# Patient Record
Sex: Male | Born: 1950
Health system: Southern US, Community
[De-identification: ages and names within clinical notes are randomized; demographics above are authoritative.]

## PROBLEM LIST (undated history)

## (undated) DIAGNOSIS — F32A Depression, unspecified: Secondary | ICD-10-CM

## (undated) DIAGNOSIS — J449 Chronic obstructive pulmonary disease, unspecified: Secondary | ICD-10-CM

## (undated) DIAGNOSIS — I10 Essential (primary) hypertension: Secondary | ICD-10-CM

## (undated) DIAGNOSIS — D126 Benign neoplasm of colon, unspecified: Secondary | ICD-10-CM

## (undated) DIAGNOSIS — F419 Anxiety disorder, unspecified: Secondary | ICD-10-CM

## (undated) DIAGNOSIS — M1990 Unspecified osteoarthritis, unspecified site: Secondary | ICD-10-CM

## (undated) DIAGNOSIS — F329 Major depressive disorder, single episode, unspecified: Secondary | ICD-10-CM

## (undated) DIAGNOSIS — E119 Type 2 diabetes mellitus without complications: Secondary | ICD-10-CM

## (undated) HISTORY — DX: Anxiety disorder, unspecified: F41.9

## (undated) HISTORY — DX: Benign neoplasm of colon, unspecified: D12.6

## (undated) HISTORY — PX: CERVICAL DISC SURGERY: SHX588

## (undated) HISTORY — PX: JOINT REPLACEMENT: SHX530

## (undated) HISTORY — PX: KNEE ARTHROSCOPY: SUR90

---

## 1999-07-16 ENCOUNTER — Emergency Department (HOSPITAL_COMMUNITY): Admission: EM | Admit: 1999-07-16 | Discharge: 1999-07-16 | Payer: Self-pay | Admitting: Emergency Medicine

## 1999-07-25 ENCOUNTER — Ambulatory Visit (HOSPITAL_COMMUNITY): Admission: RE | Admit: 1999-07-25 | Discharge: 1999-07-25 | Payer: Self-pay | Admitting: General Surgery

## 1999-07-25 ENCOUNTER — Encounter: Payer: Self-pay | Admitting: General Surgery

## 1999-07-30 ENCOUNTER — Encounter: Payer: Self-pay | Admitting: General Surgery

## 1999-07-30 ENCOUNTER — Ambulatory Visit (HOSPITAL_COMMUNITY): Admission: RE | Admit: 1999-07-30 | Discharge: 1999-07-30 | Payer: Self-pay | Admitting: General Surgery

## 1999-08-01 ENCOUNTER — Encounter: Payer: Self-pay | Admitting: General Surgery

## 1999-08-01 ENCOUNTER — Ambulatory Visit (HOSPITAL_COMMUNITY): Admission: RE | Admit: 1999-08-01 | Discharge: 1999-08-01 | Payer: Self-pay | Admitting: General Surgery

## 1999-12-12 DIAGNOSIS — D126 Benign neoplasm of colon, unspecified: Secondary | ICD-10-CM

## 1999-12-12 HISTORY — DX: Benign neoplasm of colon, unspecified: D12.6

## 1999-12-17 ENCOUNTER — Other Ambulatory Visit: Admission: RE | Admit: 1999-12-17 | Discharge: 1999-12-17 | Payer: Self-pay | Admitting: Gastroenterology

## 1999-12-17 ENCOUNTER — Encounter (INDEPENDENT_AMBULATORY_CARE_PROVIDER_SITE_OTHER): Payer: Self-pay

## 2001-02-04 ENCOUNTER — Emergency Department (HOSPITAL_COMMUNITY): Admission: EM | Admit: 2001-02-04 | Discharge: 2001-02-04 | Payer: Self-pay | Admitting: Emergency Medicine

## 2001-02-04 ENCOUNTER — Encounter: Payer: Self-pay | Admitting: Emergency Medicine

## 2007-03-20 ENCOUNTER — Emergency Department (HOSPITAL_COMMUNITY): Admission: EM | Admit: 2007-03-20 | Discharge: 2007-03-21 | Payer: Self-pay | Admitting: Emergency Medicine

## 2011-02-04 ENCOUNTER — Encounter: Payer: Medicaid Other | Attending: Physical Medicine & Rehabilitation

## 2011-02-04 ENCOUNTER — Ambulatory Visit (HOSPITAL_BASED_OUTPATIENT_CLINIC_OR_DEPARTMENT_OTHER): Payer: Medicaid Other | Admitting: Physical Medicine & Rehabilitation

## 2011-02-04 DIAGNOSIS — M545 Low back pain, unspecified: Secondary | ICD-10-CM | POA: Insufficient documentation

## 2011-02-04 DIAGNOSIS — M25559 Pain in unspecified hip: Secondary | ICD-10-CM

## 2011-02-04 DIAGNOSIS — M25519 Pain in unspecified shoulder: Secondary | ICD-10-CM | POA: Insufficient documentation

## 2011-02-04 DIAGNOSIS — M25529 Pain in unspecified elbow: Secondary | ICD-10-CM

## 2011-02-04 DIAGNOSIS — E119 Type 2 diabetes mellitus without complications: Secondary | ICD-10-CM | POA: Insufficient documentation

## 2011-02-04 DIAGNOSIS — E669 Obesity, unspecified: Secondary | ICD-10-CM | POA: Insufficient documentation

## 2011-02-04 DIAGNOSIS — F17200 Nicotine dependence, unspecified, uncomplicated: Secondary | ICD-10-CM | POA: Insufficient documentation

## 2011-02-04 DIAGNOSIS — M5430 Sciatica, unspecified side: Secondary | ICD-10-CM

## 2011-02-04 DIAGNOSIS — I10 Essential (primary) hypertension: Secondary | ICD-10-CM | POA: Insufficient documentation

## 2011-02-04 DIAGNOSIS — G894 Chronic pain syndrome: Secondary | ICD-10-CM | POA: Insufficient documentation

## 2012-11-19 ENCOUNTER — Other Ambulatory Visit: Payer: Self-pay | Admitting: Family Medicine

## 2012-11-19 DIAGNOSIS — R1011 Right upper quadrant pain: Secondary | ICD-10-CM

## 2012-11-22 ENCOUNTER — Other Ambulatory Visit: Payer: Medicaid Other

## 2012-11-24 ENCOUNTER — Ambulatory Visit
Admission: RE | Admit: 2012-11-24 | Discharge: 2012-11-24 | Disposition: A | Discharge status: Home / Self Care | Payer: Medicaid Other | Source: Ambulatory Visit | Attending: Family Medicine | Admitting: Family Medicine

## 2012-11-24 DIAGNOSIS — R1011 Right upper quadrant pain: Secondary | ICD-10-CM

## 2012-12-25 ENCOUNTER — Emergency Department (HOSPITAL_COMMUNITY): Payer: Medicaid Other

## 2012-12-25 ENCOUNTER — Encounter (HOSPITAL_COMMUNITY): Payer: Self-pay | Admitting: Emergency Medicine

## 2012-12-25 ENCOUNTER — Inpatient Hospital Stay (HOSPITAL_COMMUNITY)
Admission: EM | Admit: 2012-12-25 | Discharge: 2012-12-30 | DRG: 392 | Disposition: A | Discharge status: Home / Self Care | Payer: Medicaid Other | Attending: Internal Medicine | Admitting: Internal Medicine

## 2012-12-25 DIAGNOSIS — R933 Abnormal findings on diagnostic imaging of other parts of digestive tract: Secondary | ICD-10-CM

## 2012-12-25 DIAGNOSIS — Z9660 Presence of unspecified orthopedic joint implant: Secondary | ICD-10-CM

## 2012-12-25 DIAGNOSIS — Z794 Long term (current) use of insulin: Secondary | ICD-10-CM

## 2012-12-25 DIAGNOSIS — R1013 Epigastric pain: Secondary | ICD-10-CM

## 2012-12-25 DIAGNOSIS — Z79899 Other long term (current) drug therapy: Secondary | ICD-10-CM

## 2012-12-25 DIAGNOSIS — K2980 Duodenitis without bleeding: Principal | ICD-10-CM | POA: Diagnosis present

## 2012-12-25 DIAGNOSIS — D72829 Elevated white blood cell count, unspecified: Secondary | ICD-10-CM | POA: Diagnosis present

## 2012-12-25 DIAGNOSIS — R0902 Hypoxemia: Secondary | ICD-10-CM | POA: Diagnosis present

## 2012-12-25 DIAGNOSIS — I10 Essential (primary) hypertension: Secondary | ICD-10-CM | POA: Diagnosis present

## 2012-12-25 DIAGNOSIS — R05 Cough: Secondary | ICD-10-CM

## 2012-12-25 DIAGNOSIS — F411 Generalized anxiety disorder: Secondary | ICD-10-CM | POA: Diagnosis present

## 2012-12-25 DIAGNOSIS — M545 Low back pain, unspecified: Secondary | ICD-10-CM | POA: Diagnosis present

## 2012-12-25 DIAGNOSIS — K2970 Gastritis, unspecified, without bleeding: Secondary | ICD-10-CM | POA: Diagnosis present

## 2012-12-25 DIAGNOSIS — E669 Obesity, unspecified: Secondary | ICD-10-CM | POA: Diagnosis present

## 2012-12-25 DIAGNOSIS — K7689 Other specified diseases of liver: Secondary | ICD-10-CM | POA: Diagnosis present

## 2012-12-25 DIAGNOSIS — G8929 Other chronic pain: Secondary | ICD-10-CM | POA: Diagnosis present

## 2012-12-25 DIAGNOSIS — F419 Anxiety disorder, unspecified: Secondary | ICD-10-CM

## 2012-12-25 DIAGNOSIS — F17200 Nicotine dependence, unspecified, uncomplicated: Secondary | ICD-10-CM | POA: Diagnosis present

## 2012-12-25 DIAGNOSIS — H9190 Unspecified hearing loss, unspecified ear: Secondary | ICD-10-CM | POA: Diagnosis present

## 2012-12-25 DIAGNOSIS — K315 Obstruction of duodenum: Secondary | ICD-10-CM | POA: Diagnosis present

## 2012-12-25 DIAGNOSIS — E119 Type 2 diabetes mellitus without complications: Secondary | ICD-10-CM | POA: Diagnosis present

## 2012-12-25 DIAGNOSIS — Z6837 Body mass index (BMI) 37.0-37.9, adult: Secondary | ICD-10-CM

## 2012-12-25 HISTORY — DX: Depression, unspecified: F32.A

## 2012-12-25 HISTORY — DX: Chronic obstructive pulmonary disease, unspecified: J44.9

## 2012-12-25 HISTORY — DX: Essential (primary) hypertension: I10

## 2012-12-25 HISTORY — DX: Type 2 diabetes mellitus without complications: E11.9

## 2012-12-25 HISTORY — DX: Major depressive disorder, single episode, unspecified: F32.9

## 2012-12-25 LAB — GLUCOSE, CAPILLARY
Glucose-Capillary: 198 mg/dL — ABNORMAL HIGH (ref 70–99)
Glucose-Capillary: 196 mg/dL — ABNORMAL HIGH (ref 70–99)
Glucose-Capillary: 167 mg/dL — ABNORMAL HIGH (ref 70–99)

## 2012-12-25 LAB — COMPREHENSIVE METABOLIC PANEL
ALT: 15 U/L (ref 0–53)
AST: 11 U/L (ref 0–37)
Alkaline Phosphatase: 65 U/L (ref 39–117)
CO2: 24 mEq/L (ref 19–32)
Chloride: 102 mEq/L (ref 96–112)
GFR calc non Af Amer: 69 mL/min — ABNORMAL LOW (ref >90)
Glucose, Bld: 114 mg/dL — ABNORMAL HIGH (ref 70–99)
Potassium: 4.5 mEq/L (ref 3.5–5.1)
Sodium: 138 mEq/L (ref 135–145)
Total Bilirubin: 0.1 mg/dL — ABNORMAL LOW (ref 0.3–1.2)

## 2012-12-25 LAB — CBC WITH DIFFERENTIAL/PLATELET
Basophils Absolute: 0.1 10*3/uL (ref 0.0–0.1)
Lymphocytes Relative: 21 % (ref 12–46)
Lymphs Abs: 2.8 10*3/uL (ref 0.7–4.0)
Neutro Abs: 9.5 10*3/uL — ABNORMAL HIGH (ref 1.7–7.7)
Platelets: 424 10*3/uL — ABNORMAL HIGH (ref 150–400)
RBC: 4.31 MIL/uL (ref 4.22–5.81)
RDW: 13.4 % (ref 11.5–15.5)
WBC: 13.5 10*3/uL — ABNORMAL HIGH (ref 4.0–10.5)

## 2012-12-25 MED ORDER — FENTANYL CITRATE 0.05 MG/ML IJ SOLN
50.0000 ug | Freq: Once | INTRAMUSCULAR | Status: AC
  Administered 2012-12-25: 50 ug via INTRAVENOUS
  Filled 2012-12-25: qty 2

## 2012-12-25 MED ORDER — PANTOPRAZOLE SODIUM 40 MG IV SOLR
40.0000 mg | Freq: Once | INTRAVENOUS | Status: AC
  Administered 2012-12-25: 40 mg via INTRAVENOUS
  Filled 2012-12-25: qty 40

## 2012-12-25 MED ORDER — INSULIN ASPART 100 UNIT/ML ~~LOC~~ SOLN
0.0000 [IU] | Freq: Three times a day (TID) | SUBCUTANEOUS | Status: DC
  Administered 2012-12-25 (×2): 3 [IU] via SUBCUTANEOUS
  Administered 2012-12-26: 2 [IU] via SUBCUTANEOUS
  Administered 2012-12-26 – 2012-12-27 (×2): 3 [IU] via SUBCUTANEOUS
  Administered 2012-12-27: 5 [IU] via SUBCUTANEOUS
  Administered 2012-12-27 – 2012-12-28 (×3): 3 [IU] via SUBCUTANEOUS
  Administered 2012-12-29: 5 [IU] via SUBCUTANEOUS
  Administered 2012-12-29 – 2012-12-30 (×3): 3 [IU] via SUBCUTANEOUS

## 2012-12-25 MED ORDER — ACETAMINOPHEN 650 MG RE SUPP: 650.0000 mg | Freq: Four times a day (QID) | RECTAL | Status: DC | PRN

## 2012-12-25 MED ORDER — PANTOPRAZOLE SODIUM 40 MG IV SOLR
40.0000 mg | Freq: Two times a day (BID) | INTRAVENOUS | Status: DC
  Filled 2012-12-25: qty 40

## 2012-12-25 MED ORDER — ALBUTEROL SULFATE (5 MG/ML) 0.5% IN NEBU
2.5000 mg | INHALATION_SOLUTION | RESPIRATORY_TRACT | Status: DC
  Administered 2012-12-25: 2.5 mg via RESPIRATORY_TRACT
  Filled 2012-12-25: qty 0.5

## 2012-12-25 MED ORDER — MORPHINE SULFATE 2 MG/ML IJ SOLN
2.0000 mg | INTRAMUSCULAR | Status: DC | PRN
  Administered 2012-12-25 – 2012-12-27 (×15): 4 mg via INTRAVENOUS
  Administered 2012-12-27: 2 mg via INTRAVENOUS
  Administered 2012-12-27 (×2): 4 mg via INTRAVENOUS
  Administered 2012-12-28 – 2012-12-29 (×11): 2 mg via INTRAVENOUS
  Administered 2012-12-29: 4 mg via INTRAVENOUS
  Administered 2012-12-29 (×2): 2 mg via INTRAVENOUS
  Administered 2012-12-30 (×3): 4 mg via INTRAVENOUS
  Filled 2012-12-25: qty 2
  Filled 2012-12-25: qty 1
  Filled 2012-12-25 (×2): qty 2
  Filled 2012-12-25: qty 1
  Filled 2012-12-25: qty 2
  Filled 2012-12-25: qty 1
  Filled 2012-12-25 (×4): qty 2
  Filled 2012-12-25 (×3): qty 1
  Filled 2012-12-25 (×7): qty 2
  Filled 2012-12-25: qty 1
  Filled 2012-12-25: qty 2
  Filled 2012-12-25 (×2): qty 1
  Filled 2012-12-25: qty 2
  Filled 2012-12-25: qty 1
  Filled 2012-12-25: qty 2
  Filled 2012-12-25: qty 1
  Filled 2012-12-25: qty 2
  Filled 2012-12-25 (×3): qty 1
  Filled 2012-12-25 (×2): qty 2

## 2012-12-25 MED ORDER — IOHEXOL 300 MG/ML  SOLN
100.0000 mL | Freq: Once | INTRAMUSCULAR | Status: AC | PRN
  Administered 2012-12-25: 100 mL via INTRAVENOUS

## 2012-12-25 MED ORDER — SODIUM CHLORIDE 0.9 % IV SOLN
INTRAVENOUS | Status: DC
  Administered 2012-12-25 – 2012-12-29 (×5): via INTRAVENOUS

## 2012-12-25 MED ORDER — HYDROMORPHONE HCL PF 1 MG/ML IJ SOLN
1.0000 mg | Freq: Once | INTRAMUSCULAR | Status: AC
  Administered 2012-12-25: 1 mg via INTRAVENOUS
  Filled 2012-12-25: qty 1

## 2012-12-25 MED ORDER — PIPERACILLIN-TAZOBACTAM 3.375 G IVPB
3.3750 g | Freq: Three times a day (TID) | INTRAVENOUS | Status: DC
  Administered 2012-12-25 – 2012-12-29 (×14): 3.375 g via INTRAVENOUS
  Filled 2012-12-25 (×16): qty 50

## 2012-12-25 MED ORDER — ACETAMINOPHEN 325 MG PO TABS: 650.0000 mg | ORAL_TABLET | Freq: Four times a day (QID) | ORAL | Status: DC | PRN

## 2012-12-25 MED ORDER — HYDROMORPHONE HCL PF 1 MG/ML IJ SOLN: 1.0000 mg | INTRAMUSCULAR | Status: DC | PRN

## 2012-12-25 MED ORDER — DIAZEPAM 5 MG/ML IJ SOLN
2.5000 mg | Freq: Three times a day (TID) | INTRAMUSCULAR | Status: DC | PRN
  Administered 2012-12-26 – 2012-12-29 (×5): 2.5 mg via INTRAVENOUS
  Filled 2012-12-25 (×6): qty 2

## 2012-12-25 MED ORDER — ONDANSETRON HCL 4 MG/2ML IJ SOLN
4.0000 mg | Freq: Four times a day (QID) | INTRAMUSCULAR | Status: DC | PRN
  Administered 2012-12-26: 4 mg via INTRAVENOUS
  Filled 2012-12-25: qty 2

## 2012-12-25 MED ORDER — IOHEXOL 300 MG/ML  SOLN
50.0000 mL | Freq: Once | INTRAMUSCULAR | Status: AC | PRN
  Administered 2012-12-25: 50 mL via ORAL

## 2012-12-25 MED ORDER — ALBUTEROL SULFATE (5 MG/ML) 0.5% IN NEBU
2.5000 mg | INHALATION_SOLUTION | Freq: Once | RESPIRATORY_TRACT | Status: AC
  Administered 2012-12-25: 2.5 mg via RESPIRATORY_TRACT

## 2012-12-25 MED ORDER — ONDANSETRON HCL 4 MG PO TABS: 4.0000 mg | ORAL_TABLET | Freq: Four times a day (QID) | ORAL | Status: DC | PRN

## 2012-12-25 MED ORDER — IPRATROPIUM BROMIDE 0.02 % IN SOLN
0.5000 mg | RESPIRATORY_TRACT | Status: DC
  Administered 2012-12-25 – 2012-12-26 (×5): 0.5 mg via RESPIRATORY_TRACT
  Filled 2012-12-25 (×6): qty 2.5

## 2012-12-25 MED ORDER — SODIUM CHLORIDE 0.9 % IV SOLN
8.0000 mg/h | INTRAVENOUS | Status: DC
  Administered 2012-12-25 – 2012-12-27 (×2): 8 mg/h via INTRAVENOUS
  Filled 2012-12-25 (×6): qty 80

## 2012-12-25 MED ORDER — LEVOFLOXACIN 500 MG PO TABS
500.0000 mg | ORAL_TABLET | Freq: Once | ORAL | Status: AC
  Administered 2012-12-25: 500 mg via ORAL
  Filled 2012-12-25: qty 1

## 2012-12-25 MED ORDER — MORPHINE SULFATE 2 MG/ML IJ SOLN
2.0000 mg | INTRAMUSCULAR | Status: DC | PRN
  Administered 2012-12-25: 2 mg via INTRAVENOUS
  Filled 2012-12-25: qty 1

## 2012-12-25 MED ORDER — PREDNISONE 20 MG PO TABS
60.0000 mg | ORAL_TABLET | Freq: Once | ORAL | Status: AC
  Administered 2012-12-25: 60 mg via ORAL
  Filled 2012-12-25: qty 3

## 2012-12-25 NOTE — ED Provider Notes (Signed)
History     CSN: [id number]  Arrival date & time 12/25/12  0443   First MD Initiated Contact with Patient 12/25/12 0450      Chief Complaint  Patient presents with  . Abdominal Pain    (Consider location/radiation/quality/duration/timing/severity/associated sxs/prior treatment) HPI Comments: 62 y/o male with a history of diabetes presents to the ED complaining of right upper quadrant abdominal pain x 1 week. He went to his PCP last week who mentioned concern of gall bladder disease to patient. Describes the pain as constant, sharp, worse with deep inspiration rated 10/10. Tried taking his percocet he has for chronic back pain without relief of his abdominal pain. Admits to associated nausea without vomiting. No fever or chills. Denies diarrhea or urinary changes. No change in appetite. Admits to productive cough for the past week with associated shortness of breath. He is an every day smoker. He had an abdominal ultrasound on 1/10 hepatomegaly with diffuse fatty infiltration of the liver, no focal abnormality, no gallstones, and the pancreas is largely obscured by bowel gas.  Patient is a 62 y.o. male presenting with abdominal pain. The history is provided by the patient.  Abdominal Pain Associated symptoms: cough, nausea and shortness of breath   Associated symptoms: no chest pain, no chills, no diarrhea, no fever and no vomiting     Past Medical History  Diagnosis Date  . Diabetes mellitus without complication     Past Surgical History  Procedure Laterality Date  . Joint replacement      No family history on file.  History  Substance Use Topics  . Smoking status: Current Every Day Smoker    Types: Cigarettes  . Smokeless tobacco: Not on file  . Alcohol Use: No      Review of Systems  Constitutional: Negative for fever, chills and appetite change.  Respiratory: Positive for cough and shortness of breath.   Cardiovascular: Negative for chest pain.  Gastrointestinal:  Positive for nausea and abdominal pain. Negative for vomiting and diarrhea.  Musculoskeletal: Negative for back pain.  All other systems reviewed and are negative.    Allergies  Review of patient's allergies indicates no known allergies.  Home Medications   Current Outpatient Rx  Name  Route  Sig  Dispense  Refill  . diazepam (VALIUM) 10 MG tablet   Oral   Take 10 mg by mouth every 6 (six) hours as needed for anxiety.         . insulin NPH-insulin regular (NOVOLIN 70/30) (70-30) 100 UNIT/ML injection   Subcutaneous   Inject into the skin 2 (two) times daily with a meal.         . lisinopril (PRINIVIL,ZESTRIL) 10 MG tablet   Oral   Take 10 mg by mouth daily.         Marland Kitchen oxyCODONE-acetaminophen (PERCOCET/ROXICET) 5-325 MG per tablet   Oral   Take 1 tablet by mouth every 4 (four) hours as needed for pain.           BP 160/92  Pulse 110  Temp(Src) 98 F (36.7 C) (Oral)  Resp 20  Wt 250 lb (113.399 kg)  SpO2 99%  Physical Exam  Nursing note and vitals reviewed. Constitutional: He is oriented to person, place, and time. He appears well-developed. No distress.  Obese  HENT:  Head: Normocephalic and atraumatic.  Mouth/Throat: Oropharynx is clear and moist.  Eyes: Conjunctivae are normal.  Neck: Normal range of motion. Neck supple. No JVD present.  Cardiovascular: Regular  rhythm, normal heart sounds and intact distal pulses.  Tachycardia present.   Pulmonary/Chest: Tachypnea noted. He has wheezes (scattered). He has rhonchi (scattered inspiratory and expiratory).  Abdominal: Soft. Bowel sounds are normal. There is tenderness in the right upper quadrant and epigastric area. There is guarding and positive Murphy's sign. There is no rigidity and no rebound.  No peritoneal signs.  Musculoskeletal: Normal range of motion. He exhibits no edema.  Neurological: He is alert and oriented to person, place, and time.  Skin: Skin is warm and dry.  Psychiatric: He has a normal  mood and affect. His behavior is normal.    ED Course  Procedures (including critical care time)  Labs Reviewed  CBC WITH DIFFERENTIAL  COMPREHENSIVE METABOLIC PANEL  LIPASE, BLOOD   Dg Chest 2 View  12/25/2012  *RADIOLOGY REPORT*  Clinical Data: Anterior right chest and abdominal pain; shortness of breath.  History of smoking and diabetes.  CHEST - 2 VIEW  Comparison: None.  Findings: The lungs are well-aerated.  Minimal right basilar opacity may reflect atelectasis or possibly mild pneumonia.  There is no evidence of pleural effusion or pneumothorax.  The heart is normal in size; the mediastinal contour is within normal limits.  No acute osseous abnormalities are seen.  IMPRESSION: Minimal right basilar opacity may reflect atelectasis or possibly mild pneumonia.   Original Report Authenticated By: Tonia Ghent, M.D.      No diagnosis found.    MDM  RUQ abdominal pain concerning gall bladder disease and productive cough, sob. CXR showing mild pneumonia. He will be started on abx. Awaiting abdominal US. Case discussed with Kirichenko, PA-C who will take over care of patient at shift change.        Trevor Mace, PA-C 12/25/12 [phone]

## 2012-12-25 NOTE — ED Provider Notes (Signed)
Medical screening examination/treatment/procedure(s) were conducted as a shared visit with non-physician practitioner(s) and myself.  I personally evaluated the patient during the encounter  Questionable right lower lobe pneumonia.  Patient started on antibiotics at this time.  He continues to have right-sided abdominal pain.  CT scan pending to further evaluate.  The patient will be monitored closely in the emergency department.  He'll likely require admission as he continues to not look well   Lyanne Co, MD 12/25/12 [phone]

## 2012-12-25 NOTE — ED Notes (Signed)
Pt alert, arrives from home, c/o right upper quad abd pain, onset was last week, seen PCP, presents to ED with cont pain, c/o nausea, denies emesis, resp even unlabored, skin pwd

## 2012-12-25 NOTE — Progress Notes (Signed)
Pt reports that he had a pair of prescription sunglasses that were black that he had with him in the ED. He states that they are not present now in his room. This RN checked this pt's belongings to confirm that they were not in his bag. This RN called the ED department, and spoke with the unit secretary to ask the pt's nurse to check for the sunglasses.

## 2012-12-25 NOTE — H&P (Addendum)
Joseph Manning is an 62 y.o. male.   PCP:   No primary provider on file.   Chief Complaint:  RUQ Pain.  HPI: 69 male who has been having on/off RUQ pains and had a RUQ [place] @ one month ago which showed Nml GB and Fatty liver.  He called me at 4 am with excruciating pain and in tears in the RUQ with Nausea/No Emesis.  Sxs have been getting worse over the last week as well. Pain = Sharp.  Worse with inspriation.  No Fever/Chills.  Has been on Percocet for LBP.  Exam in ED was Sig ab pain and Murphys sign.  ED was anticipating a hot GB and started him on Abx and set him up for CT scan.  Scans and CXR c/w RLL Atx - story not consistent with PNA despite a little cough.  CT showed a significant Duodenitis and I was called for inpatient admit.  I asked ED to consult Dr Anselm Jungling group for help ruling out an Ulcer or Crohn's or other pathology.  We will keep him on IV Abx and start IV PPI. He looks ill and while getting a neb he had a Vagal episode and needed a fluid bolus. He was treated with Fentanyl for pain and is in and out of sleep but still is in pain.      Past Medical History:  Past Medical History  Diagnosis Date  . Diabetes mellitus without complication     Past Surgical History  Procedure Laterality Date  . Joint replacement        Allergies:  No Known Allergies   Medications: Prior to Admission medications   Medication Sig Start Date End Date Taking? Authorizing Provider  diazepam (VALIUM) 10 MG tablet Take 10 mg by mouth every 6 (six) hours as needed for anxiety.   Yes Historical Provider, MD  insulin NPH-insulin regular (NOVOLIN 70/30) (70-30) 100 UNIT/ML injection Inject 24-34 Units into the skin 2 (two) times daily with a meal.    Yes Historical Provider, MD  lisinopril (PRINIVIL,ZESTRIL) 10 MG tablet Take 10 mg by mouth daily.   Yes Historical Provider, MD  oxyCODONE-acetaminophen (PERCOCET/ROXICET) 5-325 MG per tablet Take 1 tablet by mouth every 4 (four) hours as needed  for pain.   Yes Historical Provider, MD      (Not in a hospital admission)   Social History:  reports that he has been smoking Cigarettes.  He has been smoking about 0.00 packs per day. He does not have any smokeless tobacco history on file. He reports that he does not drink alcohol. His drug history is not on file.  Family History: No family history on file.  Review of Systems:  Review of Systems - -No fever, chills and appetite change.  Respiratory: + cough/SOB and Pleurisy Cardiovascular: Negative for chest pain.  Gastrointestinal: + Nausea without vomiting.  + Ab Pains. No Diarrhea. Chronic back pain.  All other systems reviewed and are negative.    Physical Exam:  Blood pressure 148/70, pulse 86, temperature 97.9 F (36.6 C), temperature source Oral, resp. rate 20, weight 113.399 kg (250 lb), SpO2 98.00%. Filed Vitals:   12/25/12 0606 12/25/12 0615 12/25/12 0630 12/25/12 0846  BP: 113/74  120/62 148/70  Pulse: 105  81 86  Temp:    97.9 F (36.6 C)  TempSrc:    Oral  Resp:   22 20  Weight:      SpO2: 94% 88% 93% 98%   General appearance: Uncomfortable  in bed but in out of sleep from the meds. Head: Normocephalic, without obvious abnormality, atraumatic Eyes: conjunctivae/corneas clear. PERRL, EOM's intact.  Nose: Nares normal. Septum midline. Mucosa normal. No drainage or sinus tenderness. Throat: lips, mucosa, and tongue normal; teeth and gums normal Neck: no adenopathy, no carotid bruit, no JVD and thyroid not enlarged, symmetric, no tenderness/mass/nodules Resp: A little retracted but clear. Cardio: regular GI: + tender, + Murphy's, + Rebound, + Guarding Extremities: extremities normal, atraumatic, no cyanosis or edema Pulses: 2+ and symmetric Lymph nodes: no cervical lymphadenopathy Neurologic: Alert and oriented X 3, normal strength and tone. Normal symmetric reflexes.     Labs on Admission:   Recent Labs  12/25/12 0544  NA 138  K 4.5  CL 102  CO2  24  GLUCOSE 114*  BUN 23  CREATININE 1.12  CALCIUM 9.4    Recent Labs  12/25/12 0544  AST 11  ALT 15  ALKPHOS 65  BILITOT 0.1*  PROT 7.5  ALBUMIN 3.4*    Recent Labs  12/25/12 0544  LIPASE 41    Recent Labs  12/25/12 0544  WBC 13.5*  NEUTROABS 9.5*  HGB 13.5  HCT 40.7  MCV 94.4  PLT 424*   No results found for this basename: CKTOTAL, CKMB, CKMBINDEX, TROPONINI,  in the last 72 hours No results found for this basename: INR,  PROTIME     LAB RESULT POCT:  Results for orders placed during the hospital encounter of 12/25/12  CBC WITH DIFFERENTIAL      Result Value Range   WBC 13.5 (*) 4.0 - 10.5 K/uL   RBC 4.31  4.22 - 5.81 MIL/uL   Hemoglobin 13.5  13.0 - 17.0 g/dL   HCT 40.9  81.1 - 91.4 %   MCV 94.4  78.0 - 100.0 fL   MCH 31.3  26.0 - 34.0 pg   MCHC 33.2  30.0 - 36.0 g/dL   RDW 78.2  95.6 - 21.3 %   Platelets 424 (*) 150 - 400 K/uL   Neutrophils Relative 70  43 - 77 %   Neutro Abs 9.5 (*) 1.7 - 7.7 K/uL   Lymphocytes Relative 21  12 - 46 %   Lymphs Abs 2.8  0.7 - 4.0 K/uL   Monocytes Relative 6  3 - 12 %   Monocytes Absolute 0.9  0.1 - 1.0 K/uL   Eosinophils Relative 2  0 - 5 %   Eosinophils Absolute 0.3  0.0 - 0.7 K/uL   Basophils Relative 0  0 - 1 %   Basophils Absolute 0.1  0.0 - 0.1 K/uL  COMPREHENSIVE METABOLIC PANEL      Result Value Range   Sodium 138  135 - 145 mEq/L   Potassium 4.5  3.5 - 5.1 mEq/L   Chloride 102  96 - 112 mEq/L   CO2 24  19 - 32 mEq/L   Glucose, Bld 114 (*) 70 - 99 mg/dL   BUN 23  6 - 23 mg/dL   Creatinine, Ser 0.86  0.50 - 1.35 mg/dL   Calcium 9.4  8.4 - 57.8 mg/dL   Total Protein 7.5  6.0 - 8.3 g/dL   Albumin 3.4 (*) 3.5 - 5.2 g/dL   AST 11  0 - 37 U/L   ALT 15  0 - 53 U/L   Alkaline Phosphatase 65  39 - 117 U/L   Total Bilirubin 0.1 (*) 0.3 - 1.2 mg/dL   GFR calc non Af Amer 69 (*) >90  mL/min   GFR calc Af Amer 80 (*) >90 mL/min  LIPASE, BLOOD      Result Value Range   Lipase 41  11 - 59 U/L       Radiological Exams on Admission: Dg Chest 2 View  12/25/2012  *RADIOLOGY REPORT*  Clinical Data: Anterior right chest and abdominal pain; shortness of breath.  History of smoking and diabetes.  CHEST - 2 VIEW  Comparison: None.  Findings: The lungs are well-aerated.  Minimal right basilar opacity may reflect atelectasis or possibly mild pneumonia.  There is no evidence of pleural effusion or pneumothorax.  The heart is normal in size; the mediastinal contour is within normal limits.  No acute osseous abnormalities are seen.  IMPRESSION: Minimal right basilar opacity may reflect atelectasis or possibly mild pneumonia.   Original Report Authenticated By: Tonia Ghent, M.D.    Ct Abdomen Pelvis W Contrast  12/25/2012  *RADIOLOGY REPORT*  Clinical Data: Abdominal pain.  Right upper quadrant pain and nausea for 1 week.  CT ABDOMEN AND PELVIS WITH CONTRAST  Technique:  Multidetector CT imaging of the abdomen and pelvis was performed following the standard protocol during bolus administration of intravenous contrast.  Contrast: OMNIPAQUE IOHEXOL 300 MG/ML  SOLN  Comparison: Abdominal ultrasound 11/24/2012.  Findings:  Lung Bases: Unremarkable.  Abdomen/Pelvis:  The appearance of the liver, gallbladder, pancreas, spleen and bilateral adrenal glands is unremarkable.  No focal cystic or solid renal lesions are identified.  There is mild bilateral perinephric stranding which is nonspecific.  There is extensive asymmetric mural thickening in the second portion of the duodenum with surrounding soft tissue stranding and numerous small nonenlarged lymph nodes.  Findings are suspicious for duodenitis.  Normal appendix (retrocecal).  No ascites or pneumoperitoneum and no pathologic distension of small bowel.  No definite pathologic lymphadenopathy identified within the abdomen or pelvis.  Numerous colonic diverticula are noted, particularly of the distal descending and sigmoid colon, without surrounding  inflammatory changes to suggest an acute diverticulitis at this time.  Prostate and urinary bladder are unremarkable in appearance.  Small umbilical hernia containing only omental fat incidentally noted. Mild atherosclerosis throughout the abdominal and pelvic vasculature, without definite aneurysm or dissection.  Musculoskeletal: There are no aggressive appearing lytic or blastic lesions noted in the visualized portions of the skeleton.  IMPRESSION: 1.  Findings, as above, suspicious for duodenitis involving predominately the second portion of the duodenum.  Clinical correlation is recommended. 2.  No other acute findings identified than abdomen or pelvis. 3.  Normal appendix (retrocecal). 4.  Colonic diverticulosis without findings to suggest acute diverticulitis. 5.  Additional incidental findings, as above.   Original Report Authenticated By: Trudie Reed, M.D.       Orders placed during the hospital encounter of 12/25/12  . EKG 12-LEAD  . EKG 12-LEAD     Assessment/Plan Principal Problem:   Duodenitis Active Problems:   Leukocytosis   Smoker   Diabetes mellitus, type II   Hypertension   Anxiety  Duodenitis of unclear etiology - Continue Abx, IV PPI and get Dr Anselm Jungling group to help eval and treat.  R/Out PUDz although Hbg is fine.  No Mention of cancers noted.  ? Crohn's and need for steroids - will leave up to GI.  Will admit to step-down, provide IV Abx -  He had Levaquin and GI did Zosyn.  He had 60 mg Po Pred in ED. continue NPO, Sxatic Rx, and fluids.  Probably will need EGD to further  understand what is going on.  Check H Pylori Ab.  May need surgery consult.  LFTs and Lipase were (-).  Leukocytosis - secondary to acute illness.  Hypoxia - O2 provided.  Only Atrovent nebs as he got Vasovagal with Albuterol.  Smoker - needs to quit.  DM2 - on Insulin - provide ISS.  HTN - Hold BP meds as he just had a vasovagal episode.  Anxiety - Valium prn.  Pain control to be  provided.  Until we know whether he has an Ulcer - i will hold Loveno/Hep shots and sjust do SCDs for DVT proph.   Madisun Hargrove M 12/25/2012, 10:08 AM

## 2012-12-25 NOTE — ED Notes (Signed)
MD at bedside. 

## 2012-12-25 NOTE — ED Provider Notes (Signed)
Medical screening examination/treatment/procedure(s) were performed by non-physician practitioner and as supervising physician I was immediately available for consultation/collaboration.  Tobin Chad, MD 12/25/12 [phone]

## 2012-12-25 NOTE — ED Provider Notes (Signed)
Joseph Manning is a 62 y.o. male who presented to ED with cough, congestion, right upper abdominal pain for about 1-2 weeks. Pt signed out to me by PA Mathis Fare at shift change. Pending CT abdomen.   Results for orders placed during the hospital encounter of 12/25/12  CBC WITH DIFFERENTIAL      Result Value Range   WBC 13.5 (*) 4.0 - 10.5 K/uL   RBC 4.31  4.22 - 5.81 MIL/uL   Hemoglobin 13.5  13.0 - 17.0 g/dL   HCT 16.1  09.6 - 04.5 %   MCV 94.4  78.0 - 100.0 fL   MCH 31.3  26.0 - 34.0 pg   MCHC 33.2  30.0 - 36.0 g/dL   RDW 40.9  81.1 - 91.4 %   Platelets 424 (*) 150 - 400 K/uL   Neutrophils Relative 70  43 - 77 %   Neutro Abs 9.5 (*) 1.7 - 7.7 K/uL   Lymphocytes Relative 21  12 - 46 %   Lymphs Abs 2.8  0.7 - 4.0 K/uL   Monocytes Relative 6  3 - 12 %   Monocytes Absolute 0.9  0.1 - 1.0 K/uL   Eosinophils Relative 2  0 - 5 %   Eosinophils Absolute 0.3  0.0 - 0.7 K/uL   Basophils Relative 0  0 - 1 %   Basophils Absolute 0.1  0.0 - 0.1 K/uL  COMPREHENSIVE METABOLIC PANEL      Result Value Range   Sodium 138  135 - 145 mEq/L   Potassium 4.5  3.5 - 5.1 mEq/L   Chloride 102  96 - 112 mEq/L   CO2 24  19 - 32 mEq/L   Glucose, Bld 114 (*) 70 - 99 mg/dL   BUN 23  6 - 23 mg/dL   Creatinine, Ser 7.82  0.50 - 1.35 mg/dL   Calcium 9.4  8.4 - 95.6 mg/dL   Total Protein 7.5  6.0 - 8.3 g/dL   Albumin 3.4 (*) 3.5 - 5.2 g/dL   AST 11  0 - 37 U/L   ALT 15  0 - 53 U/L   Alkaline Phosphatase 65  39 - 117 U/L   Total Bilirubin 0.1 (*) 0.3 - 1.2 mg/dL   GFR calc non Af Amer 69 (*) >90 mL/min   GFR calc Af Amer 80 (*) >90 mL/min  LIPASE, BLOOD      Result Value Range   Lipase 41  11 - 59 U/L   Dg Chest 2 View  12/25/2012  *RADIOLOGY REPORT*  Clinical Data: Anterior right chest and abdominal pain; shortness of breath.  History of smoking and diabetes.  CHEST - 2 VIEW  Comparison: None.  Findings: The lungs are well-aerated.  Minimal right basilar opacity may reflect atelectasis or possibly mild  pneumonia.  There is no evidence of pleural effusion or pneumothorax.  The heart is normal in size; the mediastinal contour is within normal limits.  No acute osseous abnormalities are seen.  IMPRESSION: Minimal right basilar opacity may reflect atelectasis or possibly mild pneumonia.   Original Report Authenticated By: Tonia Ghent, M.D.    Ct Abdomen Pelvis W Contrast  12/25/2012  *RADIOLOGY REPORT*  Clinical Data: Abdominal pain.  Right upper quadrant pain and nausea for 1 week.  CT ABDOMEN AND PELVIS WITH CONTRAST  Technique:  Multidetector CT imaging of the abdomen and pelvis was performed following the standard protocol during bolus administration of intravenous contrast.  Contrast: OMNIPAQUE IOHEXOL 300 MG/ML  SOLN  Comparison: Abdominal ultrasound 11/24/2012.  Findings:  Lung Bases: Unremarkable.  Abdomen/Pelvis:  The appearance of the liver, gallbladder, pancreas, spleen and bilateral adrenal glands is unremarkable.  No focal cystic or solid renal lesions are identified.  There is mild bilateral perinephric stranding which is nonspecific.  There is extensive asymmetric mural thickening in the second portion of the duodenum with surrounding soft tissue stranding and numerous small nonenlarged lymph nodes.  Findings are suspicious for duodenitis.  Normal appendix (retrocecal).  No ascites or pneumoperitoneum and no pathologic distension of small bowel.  No definite pathologic lymphadenopathy identified within the abdomen or pelvis.  Numerous colonic diverticula are noted, particularly of the distal descending and sigmoid colon, without surrounding inflammatory changes to suggest an acute diverticulitis at this time.  Prostate and urinary bladder are unremarkable in appearance.  Small umbilical hernia containing only omental fat incidentally noted. Mild atherosclerosis throughout the abdominal and pelvic vasculature, without definite aneurysm or dissection.  Musculoskeletal: There are no aggressive  appearing lytic or blastic lesions noted in the visualized portions of the skeleton.  IMPRESSION: 1.  Findings, as above, suspicious for duodenitis involving predominately the second portion of the duodenum.  Clinical correlation is recommended. 2.  No other acute findings identified than abdomen or pelvis. 3.  Normal appendix (retrocecal). 4.  Colonic diverticulosis without findings to suggest acute diverticulitis. 5.  Additional incidental findings, as above.   Original Report Authenticated By: Trudie Reed, M.D.     9:39 AM Pt's CT showed duodenitis, right lower lung clear on CT. He was ordered levaquin PO for his cough, possible pneumonia. i gave him a neb treatmetn and 60mg  of prednisone. His oxygen sat desated several times in the upper 80s, he was placed on [place]. He continues to complain of abdominal pain 10/10. Fentanyl given for his pain in ED.  Protonix ordered for possible PUD. I spoke with [facility] physician on call, who has already seen pt, they will admit him. Asked for GI Consult.    Spoke with [facility], will consult today.     Lottie Mussel, [place] 12/25/12 [phone]

## 2012-12-25 NOTE — ED Notes (Signed)
Called into room by RT. Pt c/o being hot, then his eyes rolled back in his head. BP dropped and pt momentarily disoriented, diaphoretic & very pale in color. Fluid bolus started, pt repositioned and placed in trendelenburg. Withing a few minutes, pt was more alert and oriented and BP back to previous range. Pt remained pale and hot.

## 2012-12-25 NOTE — ED Notes (Signed)
Patient is resting . Currently drinking contrast. Fentanyl given without incident.

## 2012-12-25 NOTE — Consult Note (Signed)
Referring Provider: Dr. Timothy Lasso Primary Care Physician:  Dr. Eloise Harman Primary Gastroenterologist:  none  Reason for Consultation:  Severe RUQ pain  HPI: Joseph Manning is a 62 y.o. male diabetic, with history of chronic anxiety and hypertension. He is a smoker. He had remote colonoscopy in 2001 with hyperplastic polyps-cannot tell in the attic system who did this procedure but the patient believes it was a Adult nurse M.D. He Has Not Had Any Prior Abdominal Surgeries and No Other History of GI Problems he presented to the emergency room early this morning after awakening with severe abdominal pain. Patient relates that he has been having upper abdominal pain over the past few weeks which she describes as fairly constant and pressure-like. He says the pain had been occasionally waking him at night, but last night the pain was much worse and intense. He felt nauseated but did not vomit., No melena or hematochezia has been on aware of any fever or chills. He does take a regular aspirin once daily had not been on in he and NSAIDs and denies any EtOH use in the past 9 years. He was seen and evaluated in the emergency room-did have a probable vasovagal episode after receiving Dilaudid, and apparently had a transient drop in his blood pressure . CT of the abdomen and pelvis was done with oral and IV contrast and it shows an area of extensive asymmetric thickening of the second portion of the duodenum with surrounding inflammatory changes in stranding. Normal couple not enlarged lymph nodes noted. He also has colonic diverticulosis. WBC is elevated at 13.5, lipase is normal as are his liver enzymes.  He had undergone upper abdominal ultrasound as an outpatient within the past 2 weeks and this was unremarkable.   Past Medical History  Diagnosis Date  . Diabetes mellitus without complication     Past Surgical History  Procedure Laterality Date  . Joint replacement      Prior to Admission medications    Medication Sig Start Date End Date Taking? Authorizing Provider  diazepam (VALIUM) 10 MG tablet Take 10 mg by mouth every 6 (six) hours as needed for anxiety.   Yes Historical Provider, MD  insulin NPH-insulin regular (NOVOLIN 70/30) (70-30) 100 UNIT/ML injection Inject 24-34 Units into the skin 2 (two) times daily with a meal.    Yes Historical Provider, MD  lisinopril (PRINIVIL,ZESTRIL) 10 MG tablet Take 10 mg by mouth daily.   Yes Historical Provider, MD  oxyCODONE-acetaminophen (PERCOCET/ROXICET) 5-325 MG per tablet Take 1 tablet by mouth every 4 (four) hours as needed for pain.   Yes Historical Provider, MD    Current Facility-Administered Medications  Medication Dose Route Frequency Provider Last Rate Last Dose  . 0.9 %  sodium chloride infusion   Intravenous Continuous Gwen Pounds, MD      . acetaminophen (TYLENOL) tablet 650 mg  650 mg Oral Q6H PRN Gwen Pounds, MD       Or  . acetaminophen (TYLENOL) suppository 650 mg  650 mg Rectal Q6H PRN Gwen Pounds, MD      . diazepam (VALIUM) injection 2.5 mg  2.5 mg Intravenous Q8H PRN Gwen Pounds, MD      . HYDROmorphone (DILAUDID) injection 1 mg  1 mg Intravenous Q2H PRN Gwen Pounds, MD      . insulin aspart (novoLOG) injection 0-15 Units  0-15 Units Subcutaneous TID WC Gwen Pounds, MD      . ipratropium (ATROVENT) nebulizer solution 0.5 mg  0.5 mg Nebulization Q4H Trevor Mace, PA-C   0.5 mg at 12/25/12 0981  . ondansetron (ZOFRAN) tablet 4 mg  4 mg Oral Q6H PRN Gwen Pounds, MD       Or  . ondansetron [facility]) injection 4 mg  4 mg Intravenous Q6H PRN Gwen Pounds, MD      . pantoprazole (PROTONIX) injection 40 mg  40 mg Intravenous Q12H Gwen Pounds, MD        Allergies as of 12/25/2012  . (No Known Allergies)    History reviewed. No pertinent family history.  History   Social History  . Marital Status: Legally Separated    Spouse Name: N/A    Number of Children: N/A  . Years of Education: N/A   Occupational History   . Not on file.   Social History Main Topics  . Smoking status: Current Every Day Smoker    Types: Cigarettes  . Smokeless tobacco: Not on file  . Alcohol Use: No  . Drug Use: Not on file  . Sexually Active: Not on file   Other Topics Concern  . Not on file   Social History Narrative  . No narrative on file    Review of Systems: Pertinent positive and negative review of systems were noted in the above HPI section.  All other review of systems was otherwise negative. Physical Exam: Vital signs in last 24 hours: Temp:  [97.9 F (36.6 C)-98.9 F (37.2 C)] 98.9 F (37.2 C) (02/15 1145) Pulse Rate:  [81-110] 96 (02/15 1145) Resp:  [20-22] 20 (02/15 0846) BP: (113-160)/(54-92) 129/54 mmHg (02/15 1145) SpO2:  [88 %-99 %] 93 % (02/15 1145) Weight:  [250 lb (113.399 kg)] 250 lb (113.399 kg) (02/15 0447)   General:   Alert,  Well-developed, well-nourished,somewhat disheveled Wm  pleasant and cooperative in NAD  HOH-hears better in right ear Head:  Normocephalic and atraumatic. Eyes:  Sclera clear, no icterus.   Conjunctiva pink. Ears:  Normal auditory acuity. Nose:  No deformity, discharge,  or lesions. Mouth:  No deformity or lesions.   Neck:  Supple; no masses or thyromegaly. Lungs:  Clear throughout to auscultation. Few rhonchi . Heart:  Regular rate and rhythm; no murmurs, clicks, rubs,  or gallops. Abdomen:  Soft, bs quiet. He is very tender in epigastrium and ruq/Rmq with rebound, no palp mass or hsm Msk:  Symmetrical without gross deformities. . Pulses:  Normal pulses noted. Extremities:  Without clubbing or edema. Neurologic:  Alert and  oriented x4;  grossly normal neurologically. Skin:  Intact without significant lesions or rashes.. Psych:  Alert and cooperative. Normal mood and affect.  Intake/Output from previous day:   Intake/Output this shift:    Lab Results:  Recent Labs  12/25/12 0544  WBC 13.5*  HGB 13.5  HCT 40.7  PLT 424*   BMET  Recent  Labs  12/25/12 0544  NA 138  K 4.5  CL 102  CO2 24  GLUCOSE 114*  BUN 23  CREATININE 1.12  CALCIUM 9.4   LFT  Recent Labs  12/25/12 0544  PROT 7.5  ALBUMIN 3.4*  AST 11  ALT 15  ALKPHOS 65  BILITOT 0.1*     Studies/Results: Dg Chest 2 View  12/25/2012  *RADIOLOGY REPORT*  Clinical Data: Anterior right chest and abdominal pain; shortness of breath.  History of smoking and diabetes.  CHEST - 2 VIEW  Comparison: None.  Findings: The lungs are well-aerated.  Minimal right basilar opacity may  reflect atelectasis or possibly mild pneumonia.  There is no evidence of pleural effusion or pneumothorax.  The heart is normal in size; the mediastinal contour is within normal limits.  No acute osseous abnormalities are seen.  IMPRESSION: Minimal right basilar opacity may reflect atelectasis or possibly mild pneumonia.   Original Report Authenticated By: Tonia Ghent, M.D.    Ct Abdomen Pelvis W Contrast  12/25/2012  *RADIOLOGY REPORT*  Clinical Data: Abdominal pain.  Right upper quadrant pain and nausea for 1 week.  CT ABDOMEN AND PELVIS WITH CONTRAST  Technique:  Multidetector CT imaging of the abdomen and pelvis was performed following the standard protocol during bolus administration of intravenous contrast.  Contrast: OMNIPAQUE IOHEXOL 300 MG/ML  SOLN  Comparison: Abdominal ultrasound 11/24/2012.  Findings:  Lung Bases: Unremarkable.  Abdomen/Pelvis:  The appearance of the liver, gallbladder, pancreas, spleen and bilateral adrenal glands is unremarkable.  No focal cystic or solid renal lesions are identified.  There is mild bilateral perinephric stranding which is nonspecific.  There is extensive asymmetric mural thickening in the second portion of the duodenum with surrounding soft tissue stranding and numerous small nonenlarged lymph nodes.  Findings are suspicious for duodenitis.  Normal appendix (retrocecal).  No ascites or pneumoperitoneum and no pathologic distension of small bowel.   No definite pathologic lymphadenopathy identified within the abdomen or pelvis.  Numerous colonic diverticula are noted, particularly of the distal descending and sigmoid colon, without surrounding inflammatory changes to suggest an acute diverticulitis at this time.  Prostate and urinary bladder are unremarkable in appearance.  Small umbilical hernia containing only omental fat incidentally noted. Mild atherosclerosis throughout the abdominal and pelvic vasculature, without definite aneurysm or dissection.  Musculoskeletal: There are no aggressive appearing lytic or blastic lesions noted in the visualized portions of the skeleton.  IMPRESSION: 1.  Findings, as above, suspicious for duodenitis involving predominately the second portion of the duodenum.  Clinical correlation is recommended. 2.  No other acute findings identified than abdomen or pelvis. 3.  Normal appendix (retrocecal). 4.  Colonic diverticulosis without findings to suggest acute diverticulitis. 5.  Additional incidental findings, as above.   Original Report Authenticated By: Trudie Reed, M.D.     IMPRESSION:  #48 62 year old male with 2-3 week history of upper abdominal pain with acute worsening over the past 12 hours. CT scan confirms an extensive area of asymmetric thickening of the second portion of the duodenum with surrounding inflammatory changes in stranding. Etiology is unclear. No mention of duodenal diverticulum however wonder if he could have a duodenal diverticulitis, or penetrating ulcer. There is no evidence for bowel perforation on CT scan, however his exam is concerning and he will need careful observation. #2 insulin-dependent diabetes mellitus #3 chronic anxiety #4 hypertension #5 tobacco abuse  PLAN: #1 n.p.o. #2 IV PPI infusion #3 would cover her empirically with IV antibiotics, will start Zosyn #4 nursing concerned about adverse reaction to Dilaudid -we'll switch to morphine #5 eventually will need EGD, will  discuss timing. We'll check plain films in a.m-. rule out free air   Amy Esterwood  12/25/2012, 12:19 PM   GI ATTENDING  History, laboratories, and x-rays reviewed. Patient personally seen and fully examined. Agree with history and physical exam as outlined above. 62 year old with multiple medical problems. He presents with several weeks of intermittent upper abdominal pain which has progressed. He is found to have mild leukocytosis. CT scan reveals a symmetric but significant inflammation in the post bulbar duodenum. Tender on exam. Differential  diagnosis includes ulcer disease, duodenal diverticulitis, foreign body perforation, and ischemia. Agree with n.p.o., IV PPI, IV fluids, and empiric antibiotics. Morphine as needed for pain control. Continue to monitor clinically. If the patient were to significantly worsen, then surgical consultation. However, not indicated at this point. At some point will need assessment of this area with either endoscopy or followup radiology. I have discussed in detail with the patient. We will follow. Thank you  Wilhemina Bonito. Eda Keys., M.D. [facility] Division of Gastroenterology

## 2012-12-25 NOTE — ED Notes (Signed)
Pt provided gown with instructions

## 2012-12-25 NOTE — ED Notes (Signed)
Patient transported to X-ray 

## 2012-12-25 NOTE — ED Notes (Signed)
RT notified of nebs due.

## 2012-12-25 NOTE — ED Notes (Signed)
V/OJohn Russo/ Rossie Scarfone RN OK to change patient from ICU to telemetry bed

## 2012-12-26 ENCOUNTER — Inpatient Hospital Stay (HOSPITAL_COMMUNITY): Payer: Medicaid Other

## 2012-12-26 DIAGNOSIS — R1013 Epigastric pain: Secondary | ICD-10-CM

## 2012-12-26 DIAGNOSIS — R933 Abnormal findings on diagnostic imaging of other parts of digestive tract: Secondary | ICD-10-CM

## 2012-12-26 DIAGNOSIS — K2980 Duodenitis without bleeding: Principal | ICD-10-CM

## 2012-12-26 LAB — GLUCOSE, CAPILLARY
Glucose-Capillary: 116 mg/dL — ABNORMAL HIGH (ref 70–99)
Glucose-Capillary: 136 mg/dL — ABNORMAL HIGH (ref 70–99)
Glucose-Capillary: 170 mg/dL — ABNORMAL HIGH (ref 70–99)
Glucose-Capillary: 171 mg/dL — ABNORMAL HIGH (ref 70–99)

## 2012-12-26 LAB — COMPREHENSIVE METABOLIC PANEL
ALT: 11 U/L (ref 0–53)
AST: 8 U/L (ref 0–37)
Albumin: 3.2 g/dL — ABNORMAL LOW (ref 3.5–5.2)
Alkaline Phosphatase: 64 U/L (ref 39–117)
Potassium: 4.3 mEq/L (ref 3.5–5.1)
Sodium: 131 mEq/L — ABNORMAL LOW (ref 135–145)
Total Protein: 7.1 g/dL (ref 6.0–8.3)

## 2012-12-26 LAB — CBC
HCT: 37.9 % — ABNORMAL LOW (ref 39.0–52.0)
MCH: 31 pg (ref 26.0–34.0)
MCHC: 32.5 g/dL (ref 30.0–36.0)
MCV: 95.5 fL (ref 78.0–100.0)
RDW: 13.5 % (ref 11.5–15.5)
WBC: 12.1 10*3/uL — ABNORMAL HIGH (ref 4.0–10.5)

## 2012-12-26 MED ORDER — ALBUTEROL SULFATE (5 MG/ML) 0.5% IN NEBU: 2.5000 mg | INHALATION_SOLUTION | Freq: Four times a day (QID) | RESPIRATORY_TRACT | Status: DC | PRN

## 2012-12-26 MED ORDER — LISINOPRIL 10 MG PO TABS
10.0000 mg | ORAL_TABLET | Freq: Every day | ORAL | Status: DC
  Administered 2012-12-26 – 2012-12-29 (×3): 10 mg via ORAL
  Filled 2012-12-26 (×5): qty 1

## 2012-12-26 NOTE — Progress Notes (Signed)
UR completed 

## 2012-12-26 NOTE — Progress Notes (Signed)
Patient ID: Joseph Manning, male   DOB: 10-12-1951, 62 y.o.   MRN: [id number] [facility] Progress Note  Subjective: Hungry- says pain no better or worse with food at home. Pain level about the same.   KUB- no free air or obstruction  Objective:  Vital signs in last 24 hours: Temp:  [97.3 F (36.3 C)-98.9 F (37.2 C)] 97.3 F (36.3 C) (02/16 0539) Pulse Rate:  [69-103] 103 (02/16 0539) Resp:  [20] 20 (02/16 0539) BP: (121-156)/(50-69) 156/69 mmHg (02/16 0539) SpO2:  [93 %-96 %] 93 % (02/16 0539) Weight:  [246 lb 14.6 oz (112 kg)-247 lb 9.6 oz (112.311 kg)] 247 lb 9.6 oz (112.311 kg) (02/16 0539) Last BM Date: 12/25/12 General:   Alert,  Well-developed,    in NAD Heart:  Regular rate and rhythm; no murmurs Pulm;clear Abdomen:  Soft,tender across upper abdomen with guarding, Bs+.   Extremities:  Without edema. Neurologic:  Alert and  oriented x4;  grossly normal neurologically. Psych:  Alert and cooperative. Normal mood and affect.  Intake/Output from previous day:   Intake/Output this shift:    Lab Results:  Recent Labs  12/25/12 0544 12/26/12 0522  WBC 13.5* 12.1*  HGB 13.5 12.3*  HCT 40.7 37.9*  PLT 424* 391   BMET  Recent Labs  12/25/12 0544 12/26/12 0522  NA 138 131*  K 4.5 4.3  CL 102 95*  CO2 24 27  GLUCOSE 114* 168*  BUN 23 15  CREATININE 1.12 0.99  CALCIUM 9.4 8.7   LFT  Recent Labs  12/26/12 0522  PROT 7.1  ALBUMIN 3.2*  AST 8  ALT 11  ALKPHOS 64  BILITOT 0.2*   PT/INR  Recent Labs  12/26/12 0522  LABPROT 12.7  INR 0.96    Assessment / Plan: #1 62 yo male with acute inflammatory process of duodenum-stable, no real improvement yet. Continue IV PPI infusion,IV Zosyn, allow sips of clears Continue supportive management- may need repeat CT in 48 hours. To reassess. Principal Problem:   Duodenitis Active Problems:   Leukocytosis   Smoker   Diabetes mellitus, type II   Hypertension   Anxiety     LOS: 1 day    Amy Esterwood  12/26/2012, 11:35 AM   GI ATTENDING  Interval history and laboratories reviewed. Patient is clinically unchanged. Exam unchanged. Differential diagnosis remains the same. Continue with antibiotics, PPI, and diet limited to sips. We will continue to follow.  Wilhemina Bonito. Eda Keys., M.D. [facility] Division of Gastroenterology

## 2012-12-26 NOTE — Progress Notes (Addendum)
Subjective: Admitted 12/25/12 with Severe Duodenitis on CT treated with IV PPI and IV Abx. IVF's ordered but not running. He is NPO. Appreciate GI involvement. No new C/O Wants to eat. Pain is better at moment until i touch him.  Objective: Vital signs in last 24 hours: Temp:  [97.3 F (36.3 C)-98.9 F (37.2 C)] 97.3 F (36.3 C) (02/16 0539) Pulse Rate:  [69-103] 103 (02/16 0539) Resp:  [20] 20 (02/16 0539) BP: (121-156)/(50-69) 156/69 mmHg (02/16 0539) SpO2:  [93 %-96 %] 93 % (02/16 0539) Weight:  [112 kg (246 lb 14.6 oz)-112.311 kg (247 lb 9.6 oz)] 112.311 kg (247 lb 9.6 oz) (02/16 0539) Weight change: -1.399 kg (-3 lb 1.4 oz) Last BM Date: 12/25/12  CBG (last 3)   Recent Labs  12/26/12 0011 12/26/12 0400 12/26/12 0755  GLUCAP 171* 170* 147*    Intake/Output from previous day: No intake or output data in the 24 hours ending 12/26/12 0906     Physical Exam General appearance: more comfortable than yesterday Eyes: conjunctivae/corneas clear. PERRL, EOM's intact.  OP - Dry Resp: Mostly clear.  Cardio: regular  GI: + tender but less, + Murphy's, + Rebound, + Guarding  Extremities: extremities normal, atraumatic, no cyanosis or edema  Pulses: 2+ and symmetric  Neurologic: Alert and oriented X 3, normal strength and tone. Normal symmetric reflexes.      Lab Results:  Recent Labs  12/25/12 0544 12/26/12 0522  NA 138 131*  K 4.5 4.3  CL 102 95*  CO2 24 27  GLUCOSE 114* 168*  BUN 23 15  CREATININE 1.12 0.99  CALCIUM 9.4 8.7     Recent Labs  12/25/12 0544 12/26/12 0522  AST 11 8  ALT 15 11  ALKPHOS 65 64  BILITOT 0.1* 0.2*  PROT 7.5 7.1  ALBUMIN 3.4* 3.2*     Recent Labs  12/25/12 0544 12/26/12 0522  WBC 13.5* 12.1*  NEUTROABS 9.5*  --   HGB 13.5 12.3*  HCT 40.7 37.9*  MCV 94.4 95.5  PLT 424* 391    Lab Results  Component Value Date   INR 0.96 12/26/2012    No results found for this basename: CKTOTAL, CKMB, CKMBINDEX, TROPONINI,   in the last 72 hours  No results found for this basename: TSH, T4TOTAL, FREET3, T3FREE, THYROIDAB,  in the last 72 hours  No results found for this basename: VITAMINB12, FOLATE, FERRITIN, TIBC, IRON, RETICCTPCT,  in the last 72 hours  Micro Results: No results found for this or any previous visit (from the past 240 hour(s)).   Studies/Results: Dg Chest 2 View  12/25/2012  *RADIOLOGY REPORT*  Clinical Data: Anterior right chest and abdominal pain; shortness of breath.  History of smoking and diabetes.  CHEST - 2 VIEW  Comparison: None.  Findings: The lungs are well-aerated.  Minimal right basilar opacity may reflect atelectasis or possibly mild pneumonia.  There is no evidence of pleural effusion or pneumothorax.  The heart is normal in size; the mediastinal contour is within normal limits.  No acute osseous abnormalities are seen.  IMPRESSION: Minimal right basilar opacity may reflect atelectasis or possibly mild pneumonia.   Original Report Authenticated By: Tonia Ghent, M.D.    Ct Abdomen Pelvis W Contrast  12/25/2012  *RADIOLOGY REPORT*  Clinical Data: Abdominal pain.  Right upper quadrant pain and nausea for 1 week.  CT ABDOMEN AND PELVIS WITH CONTRAST  Technique:  Multidetector CT imaging of the abdomen and pelvis was performed following the standard protocol  during bolus administration of intravenous contrast.  Contrast: OMNIPAQUE IOHEXOL 300 MG/ML  SOLN  Comparison: Abdominal ultrasound 11/24/2012.  Findings:  Lung Bases: Unremarkable.  Abdomen/Pelvis:  The appearance of the liver, gallbladder, pancreas, spleen and bilateral adrenal glands is unremarkable.  No focal cystic or solid renal lesions are identified.  There is mild bilateral perinephric stranding which is nonspecific.  There is extensive asymmetric mural thickening in the second portion of the duodenum with surrounding soft tissue stranding and numerous small nonenlarged lymph nodes.  Findings are suspicious for duodenitis.   Normal appendix (retrocecal).  No ascites or pneumoperitoneum and no pathologic distension of small bowel.  No definite pathologic lymphadenopathy identified within the abdomen or pelvis.  Numerous colonic diverticula are noted, particularly of the distal descending and sigmoid colon, without surrounding inflammatory changes to suggest an acute diverticulitis at this time.  Prostate and urinary bladder are unremarkable in appearance.  Small umbilical hernia containing only omental fat incidentally noted. Mild atherosclerosis throughout the abdominal and pelvic vasculature, without definite aneurysm or dissection.  Musculoskeletal: There are no aggressive appearing lytic or blastic lesions noted in the visualized portions of the skeleton.  IMPRESSION: 1.  Findings, as above, suspicious for duodenitis involving predominately the second portion of the duodenum.  Clinical correlation is recommended. 2.  No other acute findings identified than abdomen or pelvis. 3.  Normal appendix (retrocecal). 4.  Colonic diverticulosis without findings to suggest acute diverticulitis. 5.  Additional incidental findings, as above.   Original Report Authenticated By: Trudie Reed, M.D.      Medications: Scheduled: . insulin aspart  0-15 Units Subcutaneous TID WC  . ipratropium  0.5 mg Nebulization Q4H  . piperacillin-tazobactam (ZOSYN)  IV  3.375 g Intravenous Q8H   Continuous: . sodium chloride 60 mL/hr at 12/25/12 1324  . pantoprozole (PROTONIX) infusion 8 mg/hr (12/25/12 1419)     Assessment/Plan: Principal Problem:   Duodenitis Active Problems:   Leukocytosis   Smoker   Diabetes mellitus, type II   Hypertension   Anxiety  Duodenitis of unclear etiology (No cancers seen on CT, Crohns unlikely, PUDz?) - Continue Abx, IV PPI, IV Fluids, sips and chips only and continue GI eval.  Dr Marina Goodell saw him yesterday and stated " CT scan reveals a symmetric but significant inflammation in the post bulbar duodenum.  Tender on exam. Differential diagnosis includes ulcer disease, duodenal diverticulitis, foreign body perforation, and ischemia. Agree with n.p.o., IV PPI, IV fluids, and empiric antibiotics. Morphine as needed for pain control. Continue to monitor clinically. If the patient were to significantly worsen, then surgical consultation. However, not indicated at this point. At some point will need assessment of this area with either endoscopy or followup radiology."  He had 60 mg Po Pred in ED and this has not been continued. H Pylori Ab Pending. May need surgery consult. LFTs and Lipase were (-).  Repeat Ab Films this am pending   Leukocytosis - secondary to acute illness and a little better today.  Hypoxia - O2 provided. Only Atrovent nebs as he got Vasovagal with Albuterol. Hypoxia believed secondary to RLL Atx and retractions from RUQ pains on top of long time smoking and sig Obesity. Smoker - needs to quit.  Extreme HOH - stable for him.  HTN - restart BP meds and monitor for further vasovagal episodes.  Anxiety - Valium prn.  Pain control provided.  Until we know whether he has an Ulcer -Lovenox/Hep shots and just do SCDs for DVT proph.  DM2 - provide ISS may need N.  70/30 on hold.  Recent Labs  12/26/12 0011 12/26/12 0400 12/26/12 0755  GLUCAP 171* 170* 147*   ID -  Anti-infectives   Start     Dose/Rate Route Frequency Ordered Stop   12/25/12 1400  piperacillin-tazobactam (ZOSYN) IVPB 3.375 g     3.375 g 12.5 mL/hr over 240 Minutes Intravenous Every 8 hours 12/25/12 1238     12/25/12 0615  levofloxacin (LEVAQUIN) tablet 500 mg     500 mg Oral  Once 12/25/12 0604 12/25/12 0636     DVT Prophylaxis    LOS: 1 day   Jamonte Curfman M 12/26/2012, 9:06 AM

## 2012-12-27 LAB — GLUCOSE, CAPILLARY
Glucose-Capillary: 128 mg/dL — ABNORMAL HIGH (ref 70–99)
Glucose-Capillary: 163 mg/dL — ABNORMAL HIGH (ref 70–99)
Glucose-Capillary: 215 mg/dL — ABNORMAL HIGH (ref 70–99)
Glucose-Capillary: 141 mg/dL — ABNORMAL HIGH (ref 70–99)

## 2012-12-27 MED ORDER — PANTOPRAZOLE SODIUM 40 MG IV SOLR
40.0000 mg | Freq: Two times a day (BID) | INTRAVENOUS | Status: DC
  Administered 2012-12-27 – 2012-12-29 (×3): 40 mg via INTRAVENOUS
  Filled 2012-12-27 (×6): qty 40

## 2012-12-27 NOTE — Consult Note (Deleted)
[facility] Progress Note  SUBJECTIVE: Hungry, want to eat  OBJECTIVE:  Vital signs in last 24 hours: Temp:  [98.2 F (36.8 C)-98.8 F (37.1 C)] 98.8 F (37.1 C) (02/17 0445) Pulse Rate:  [72-92] 91 (02/17 0445) Resp:  [16-20] 20 (02/17 0445) BP: (115-126)/(43-83) 115/60 mmHg (02/17 0445) SpO2:  [90 %-99 %] 90 % (02/17 0445) Weight:  [245 lb 9.5 oz (111.4 kg)] 245 lb 9.5 oz (111.4 kg) (02/17 0445) Last BM Date: 12/25/12 General:   white male in NAD Lungs: Respirations even and unlabored, coarse breath sounds at bases Abdomen:  Soft, obese, moderate RUQ /j right mid abdominal tenderness. Normal bowel sounds. .Neurologic:  Alert and oriented,  grossly normal neurologically. Psych:  Cooperative. Normal mood and affect.   Lab Results:  Recent Labs  12/25/12 0544 12/26/12 0522  WBC 13.5* 12.1*  HGB 13.5 12.3*  HCT 40.7 37.9*  PLT 424* 391   BMET  Recent Labs  12/25/12 0544 12/26/12 0522  NA 138 131*  K 4.5 4.3  CL 102 95*  CO2 24 27  GLUCOSE 114* 168*  BUN 23 15  CREATININE 1.12 0.99  CALCIUM 9.4 8.7   LFT  Recent Labs  12/26/12 0522  PROT 7.1  ALBUMIN 3.2*  AST 8  ALT 11  ALKPHOS 64  BILITOT 0.2*   PT/INR  Recent Labs  12/26/12 0522  LABPROT 12.7  INR 0.96    Studies/Results: Ct Abdomen Pelvis W Contrast  12/25/2012  *RADIOLOGY REPORT*  Clinical Data: Abdominal pain.  Right upper quadrant pain and nausea for 1 week.  CT ABDOMEN AND PELVIS WITH CONTRAST  Technique:  Multidetector CT imaging of the abdomen and pelvis was performed following the standard protocol during bolus administration of intravenous contrast.  Contrast: OMNIPAQUE IOHEXOL 300 MG/ML  SOLN  Comparison: Abdominal ultrasound 11/24/2012.  Findings:  Lung Bases: Unremarkable.  Abdomen/Pelvis:  The appearance of the liver, gallbladder, pancreas, spleen and bilateral adrenal glands is unremarkable.  No focal cystic or solid renal lesions are identified.  There is mild  bilateral perinephric stranding which is nonspecific.  There is extensive asymmetric mural thickening in the second portion of the duodenum with surrounding soft tissue stranding and numerous small nonenlarged lymph nodes.  Findings are suspicious for duodenitis.  Normal appendix (retrocecal).  No ascites or pneumoperitoneum and no pathologic distension of small bowel.  No definite pathologic lymphadenopathy identified within the abdomen or pelvis.  Numerous colonic diverticula are noted, particularly of the distal descending and sigmoid colon, without surrounding inflammatory changes to suggest an acute diverticulitis at this time.  Prostate and urinary bladder are unremarkable in appearance.  Small umbilical hernia containing only omental fat incidentally noted. Mild atherosclerosis throughout the abdominal and pelvic vasculature, without definite aneurysm or dissection.  Musculoskeletal: There are no aggressive appearing lytic or blastic lesions noted in the visualized portions of the skeleton.  IMPRESSION: 1.  Findings, as above, suspicious for duodenitis involving predominately the second portion of the duodenum.  Clinical correlation is recommended. 2.  No other acute findings identified than abdomen or pelvis. 3.  Normal appendix (retrocecal). 4.  Colonic diverticulosis without findings to suggest acute diverticulitis. 5.  Additional incidental findings, as above.   Original Report Authenticated By: Trudie Reed, M.D.    Dg Abd Acute W/chest  12/26/2012  *RADIOLOGY REPORT*  Clinical Data: Abdominal pain and shortness of breath.  ACUTE ABDOMEN SERIES (ABDOMEN 2 VIEW & CHEST 1 VIEW)  Comparison: 12/25/2012 chest radiograph  Findings: The cardiomediastinal  silhouette is unremarkable. Mild peribronchial thickening is stable. There is no evidence of airspace disease, pleural effusion or pneumothorax.  Gas and stool filled colon noted with a moderate amount of stool noted within the right colon. No dilated  small bowel loops are present. There is no evidence of pneumoperitoneum. No suspicious calcifications are identified.  IMPRESSION: No evidence of bowel obstruction or pneumoperitoneum.  Moderate right colonic stool.  No evidence of acute or pulmonary disease.   Original Report Authenticated By: Harmon Pier, M.D.      ASSESSMENT / PLAN:   Duodenal inflammatory process. Rule out PUD,  duodenal diverticulitis, foreign body perforation, or ischemia. WBC improving (last checked yesterday). Patient afebrile.  He is on #3 day of Zosyn. Also on PPI drip day #3. Still requiring frequent pain meds. Without pain medications pain is 9/10. No peritoneal sgns on exam.Continue supportive care. For repeat CTscan soon and/or upper endoscopy for further evaluation.           LOS: 2 days   Willette Cluster  12/27/2012, 8:14 AM

## 2012-12-27 NOTE — Progress Notes (Signed)
INITIAL NUTRITION ASSESSMENT  DOCUMENTATION CODES Per approved criteria  -Obesity Unspecified   INTERVENTION:  RD will continue to monitor and will provide pt with Glucerna Shake if appropriate once abdominal pain has decreased  NUTRITION DIAGNOSIS: Unintentional weight loss related to severe abdominal pain as evidenced by ~18 pound weight loss (7% body weight) in 3 months, not significant.   Goal: Meet >/=90% estimated nutrition needs  Monitor:  Decrease in abdominal pain, PO's, weight trends, labs  Reason for Assessment: Malnutrition Screening Tool  62 y.o. male  Admitting Dx: Duodenitis  ASSESSMENT: Pt admitted with c/o right upper quadrant abdominal pain that started ~2 weeks ago, concerning gallbladder disease. Per PA note, pt reports no changes in appetite. Abdominal ultrasound on 1/10 showed hepatomegaly with diffuse fatty infiltration of the liver. Chest x-ray on 2/15 showed mild pneumonia. CT consistent with asymmetric thickening of the second portion of the duodenum with surrounding inflammation, no evidence for bowel perforation.   Per several notes, pt stated he was hungry and wanted to eat while he was NPO. Pt's lunch tray was in front of him during visit. He'd had eaten about 1/2 of his hamburger and possibly 1-2 bites of potato salad. Pt states his usual weight is 258-263 pounds and that the last time he weighed this was 3 months ago. He states that over the past few months he has lost weight due to severe abdominal pain and being unable to eat meals due to the pain. He says that he will snack throughout the day but is unable to eat much at any one time. Pt states that he would like to lose weight and is happy that his weight has decreased slightly although his unintentional weight loss due to severe pain is concerning. Advised him that a gradual weight loss when he is feeling better would be preferred.   Talked with pt about adding Glucerna Shake due to the fact that he  is at risk for malnutrition due to decreased intake and 7% weight loss in 3 months but pt prefers to wait until the abdominal pain has improved. RD will continue to monitor pain status and appropriateness of adding Glucerna Shake.    Height: Ht Readings from Last 1 Encounters:  12/25/12 5\' 8"  (1.727 m)    Weight: Wt Readings from Last 1 Encounters:  12/27/12 245 lb 9.5 oz (111.4 kg)    Ideal Body Weight: 154 lbs  % Ideal Body Weight: 159%  Wt Readings from Last 10 Encounters:  12/27/12 245 lb 9.5 oz (111.4 kg)    Usual Body Weight: ~263 lbs, per pt report  % Usual Body Weight: 93%  BMI:  Body mass index is 37.35 kg/(m^2).--Obesity Class II  Estimated Nutritional Needs: Kcal: 2000-2200 Protein: 100-110 grams Fluid: >/=2 L/day  Skin: intact, no wounds noted  Diet Order: Cardiac  EDUCATION NEEDS: -No education needs identified at this time   Intake/Output Summary (Last 24 hours) at 12/27/12 1518 Last data filed at 12/27/12 1300  Gross per 24 hour  Intake 2949.83 ml  Output    600 ml  Net 2349.83 ml    Last BM: 2/17  Labs:   Recent Labs Lab 12/25/12 0544 12/26/12 0522  NA 138 131*  K 4.5 4.3  CL 102 95*  CO2 24 27  BUN 23 15  CREATININE 1.12 0.99  CALCIUM 9.4 8.7  GLUCOSE 114* 168*    CBG (last 3)   Recent Labs  12/27/12 0444 12/27/12 0745 12/27/12 1202  GLUCAP 128*  163* 165*    Scheduled Meds: . insulin aspart  0-15 Units Subcutaneous TID WC  . lisinopril  10 mg Oral Daily  . pantoprazole (PROTONIX) IV  40 mg Intravenous Q12H  . piperacillin-tazobactam (ZOSYN)  IV  3.375 g Intravenous Q8H    Continuous Infusions: . sodium chloride 75 mL/hr at 12/26/12 1900    Past Medical History  Diagnosis Date  . Diabetes mellitus without complication     Past Surgical History  Procedure Laterality Date  . Joint replacement      Trenton Gammon Dietetic Intern # [phone]

## 2012-12-27 NOTE — Progress Notes (Signed)
RN contacted pharmacy to notify that the rate of the continuous Protonix had been at 8 ml/hr and not 25 ml/hr. This rate has been infusing this way since 2/15.  New infusion bag arrived at the floor from pharmacy at approximately 0040. RN hung Protonix at a rate of 55ml/hr with a dose of 8 mg/hr.

## 2012-12-27 NOTE — Progress Notes (Signed)
Subjective: Admitted 12/25/12 with Severe Duodenitis on CT treated with IV PPI, IVFs, and IV Abx. He is now clears Appreciate GI involvement. No new C/O and he is doing some better and less pain Wants to eat.   Objective: Vital signs in last 24 hours: Temp:  [98.2 F (36.8 C)-98.8 F (37.1 C)] 98.8 F (37.1 C) (02/17 0445) Pulse Rate:  [72-92] 91 (02/17 0445) Resp:  [16-20] 20 (02/17 0445) BP: (115-126)/(43-83) 115/60 mmHg (02/17 0445) SpO2:  [90 %-99 %] 90 % (02/17 0445) Weight:  [111.4 kg (245 lb 9.5 oz)] 111.4 kg (245 lb 9.5 oz) (02/17 0445) Weight change: -0.6 kg (-1 lb 5.2 oz) Last BM Date: 12/25/12  CBG (last 3)   Recent Labs  12/27/12 0016 12/27/12 0444 12/27/12 0745  GLUCAP 141* 128* 163*    Intake/Output from previous day:  Intake/Output Summary (Last 24 hours) at 12/27/12 1004 Last data filed at 12/27/12 0650  Gross per 24 hour  Intake 3069.83 ml  Output    850 ml  Net 2219.83 ml   02/16 0701 - 02/17 0700 In: 3069.8 [P.O.:240; I.V.:2529.8; IV Piggyback:300] Out: 850 [Urine:850]   Physical Exam General appearance: Even more comfortable than yesterday Eyes: conjunctivae/corneas clear. PERRL, EOM's intact.  OP - Dry Resp: Mostly clear.  Cardio: regular  GI: Much less tender and much softer Extremities: extremities normal, atraumatic, no cyanosis or edema  Pulses: 2+ and symmetric  Neurologic: Alert and oriented X 3, normal strength and tone. Normal symmetric reflexes.      Lab Results:  Recent Labs  12/25/12 0544 12/26/12 0522  NA 138 131*  K 4.5 4.3  CL 102 95*  CO2 24 27  GLUCOSE 114* 168*  BUN 23 15  CREATININE 1.12 0.99  CALCIUM 9.4 8.7     Recent Labs  12/25/12 0544 12/26/12 0522  AST 11 8  ALT 15 11  ALKPHOS 65 64  BILITOT 0.1* 0.2*  PROT 7.5 7.1  ALBUMIN 3.4* 3.2*     Recent Labs  12/25/12 0544 12/26/12 0522  WBC 13.5* 12.1*  NEUTROABS 9.5*  --   HGB 13.5 12.3*  HCT 40.7 37.9*  MCV 94.4 95.5  PLT 424* 391     Lab Results  Component Value Date   INR 0.96 12/26/2012    No results found for this basename: CKTOTAL, CKMB, CKMBINDEX, TROPONINI,  in the last 72 hours  No results found for this basename: TSH, T4TOTAL, FREET3, T3FREE, THYROIDAB,  in the last 72 hours  No results found for this basename: VITAMINB12, FOLATE, FERRITIN, TIBC, IRON, RETICCTPCT,  in the last 72 hours  Micro Results: No results found for this or any previous visit (from the past 240 hour(s)).   Studies/Results: Dg Abd Acute W/chest  12/26/2012  *RADIOLOGY REPORT*  Clinical Data: Abdominal pain and shortness of breath.  ACUTE ABDOMEN SERIES (ABDOMEN 2 VIEW & CHEST 1 VIEW)  Comparison: 12/25/2012 chest radiograph  Findings: The cardiomediastinal silhouette is unremarkable. Mild peribronchial thickening is stable. There is no evidence of airspace disease, pleural effusion or pneumothorax.  Gas and stool filled colon noted with a moderate amount of stool noted within the right colon. No dilated small bowel loops are present. There is no evidence of pneumoperitoneum. No suspicious calcifications are identified.  IMPRESSION: No evidence of bowel obstruction or pneumoperitoneum.  Moderate right colonic stool.  No evidence of acute or pulmonary disease.   Original Report Authenticated By: Harmon Pier, M.D.      Medications: Scheduled: .  insulin aspart  0-15 Units Subcutaneous TID WC  . lisinopril  10 mg Oral Daily  . piperacillin-tazobactam (ZOSYN)  IV  3.375 g Intravenous Q8H   Continuous: . sodium chloride 75 mL/hr at 12/26/12 1900  . pantoprozole (PROTONIX) infusion 8 mg/hr (12/27/12 0048)     Assessment/Plan: Principal Problem:   Duodenitis Active Problems:   Leukocytosis   Smoker   Diabetes mellitus, type II   Hypertension   Anxiety  Duodenitis of unclear etiology (No cancers seen on CT, Crohns unlikely, PUDz?, probably ischemic) - doing better on current rxs = Continue Abx, IV PPI, IV Fluids, clears only and  continue GI eval.  H Pylori Ab Pending. May need surgery consult. LFTs and Lipase were (-).  Repeat Ab Films yesterday did not show free air.  Leukocytosis - secondary to acute illness and a little better at last check - recheck labs tomorrow Hypoxia - O2 provided. Only Atrovent nebs as he got Vasovagal with Albuterol. Hypoxia believed secondary to RLL Atx and retractions from RUQ pains on top of long time smoking and sig Obesity. Smoker - needs to quit.  Extreme HOH - stable for him.  HTN - restarted BP meds yesterday and BP fine this am = monitor for further vasovagal episodes.  Anxiety - Valium prn.  Pain control provided.  Until we know whether he has an Ulcer -Lovenox/Hep shots and just do SCDs for DVT proph.    DM2 - provide ISS may need N.  70/30 on hold. Now on Liquids - his CBGs are expected to rise.  Recent Labs  12/27/12 0016 12/27/12 0444 12/27/12 0745  GLUCAP 141* 128* 163*   ID -  Anti-infectives   Start     Dose/Rate Route Frequency Ordered Stop   12/25/12 1400  piperacillin-tazobactam (ZOSYN) IVPB 3.375 g     3.375 g 12.5 mL/hr over 240 Minutes Intravenous Every 8 hours 12/25/12 1238     12/25/12 0615  levofloxacin (LEVAQUIN) tablet 500 mg     500 mg Oral  Once 12/25/12 0604 12/25/12 0636      LOS: 2 days   [name] M 12/27/2012, 10:04 AM

## 2012-12-27 NOTE — Progress Notes (Signed)
[facility] Progress Note  SUBJECTIVE: Hungry, wants to eat  OBJECTIVE:  Vital signs in last 24 hours: Temp:  [98.2 F (36.8 C)-98.8 F (37.1 C)] 98.8 F (37.1 C) (02/17 0445) Pulse Rate:  [72-92] 91 (02/17 0445) Resp:  [16-20] 20 (02/17 0445) BP: (115-126)/(43-83) 115/60 mmHg (02/17 0445) SpO2:  [90 %-99 %] 90 % (02/17 0445) Weight:  [245 lb 9.5 oz (111.4 kg)] 245 lb 9.5 oz (111.4 kg) (02/17 0445) Last BM Date: 12/25/12 General:   white male in NAD Lungs: Respirations even and unlabored, coarse breath sounds at bases Abdomen:  Soft, obese, moderate RUQ /j right mid abdominal tenderness. Normal bowel sounds. .Neurologic:  Alert and oriented,  grossly normal neurologically. Psych:  Cooperative. Normal mood and affect.   Lab Results:  Recent Labs  12/25/12 0544 12/26/12 0522  WBC 13.5* 12.1*  HGB 13.5 12.3*  HCT 40.7 37.9*  PLT 424* 391   BMET  Recent Labs  12/25/12 0544 12/26/12 0522  NA 138 131*  K 4.5 4.3  CL 102 95*  CO2 24 27  GLUCOSE 114* 168*  BUN 23 15  CREATININE 1.12 0.99  CALCIUM 9.4 8.7   LFT  Recent Labs  12/26/12 0522  PROT 7.1  ALBUMIN 3.2*  AST 8  ALT 11  ALKPHOS 64  BILITOT 0.2*   PT/INR  Recent Labs  12/26/12 0522  LABPROT 12.7  INR 0.96    ASSESSMENT / PLAN:   Duodenal inflammatory process. Rule out PUD,  duodenal diverticulitis, foreign body perforation, or ischemia. WBC improving (last checked yesterday). Patient afebrile.  He is on #3 day of Zosyn. Also on PPI drip day #3. Still requiring frequent pain meds. Without pain medications pain is 9/10. No peritoneal sgns on exam.Continue supportive care. For repeat CTscan soon and/or upper endoscopy for further evaluation.     LOS: 2 days   Willette Cluster  12/27/2012, 8:14 AM    ________________________________________________________________________  [facility] MD note:  I personally examined the patient, reviewed the data and agree with the assessment and  plan described above.  Still in pain (improved but still present), tolerated grits without worsening of pain, nausea or vomiting.  I am most suspicious of duodenal ulcer.  Agree with continued PPI but this can be changed to BID dosing for now.  Might has associated (contained, small) perforation.  Will set up plain films in AM and if no free air then will plan on EGD tomorrow.  Rob Bunting, MD [facility] Pager [phone]

## 2012-12-28 ENCOUNTER — Inpatient Hospital Stay (HOSPITAL_COMMUNITY): Payer: Medicaid Other

## 2012-12-28 ENCOUNTER — Encounter (HOSPITAL_COMMUNITY): Payer: Self-pay | Admitting: *Deleted

## 2012-12-28 ENCOUNTER — Encounter (HOSPITAL_COMMUNITY)
Admission: EM | Disposition: A | Discharge status: Home / Self Care | Payer: Self-pay | Source: Home / Self Care | Attending: Internal Medicine

## 2012-12-28 HISTORY — PX: ESOPHAGOGASTRODUODENOSCOPY: SHX5428

## 2012-12-28 LAB — COMPREHENSIVE METABOLIC PANEL
AST: 9 U/L (ref 0–37)
Albumin: 3 g/dL — ABNORMAL LOW (ref 3.5–5.2)
BUN: 10 mg/dL (ref 6–23)
Calcium: 8.3 mg/dL — ABNORMAL LOW (ref 8.4–10.5)
Creatinine, Ser: 1.08 mg/dL (ref 0.50–1.35)

## 2012-12-28 LAB — CBC
Hemoglobin: 11.6 g/dL — ABNORMAL LOW (ref 13.0–17.0)
MCH: 31.2 pg (ref 26.0–34.0)
MCV: 95.4 fL (ref 78.0–100.0)
Platelets: 397 10*3/uL (ref 150–400)
RBC: 3.72 MIL/uL — ABNORMAL LOW (ref 4.22–5.81)
WBC: 8.6 10*3/uL (ref 4.0–10.5)

## 2012-12-28 LAB — GLUCOSE, CAPILLARY
Glucose-Capillary: 150 mg/dL — ABNORMAL HIGH (ref 70–99)
Glucose-Capillary: 151 mg/dL — ABNORMAL HIGH (ref 70–99)
Glucose-Capillary: 128 mg/dL — ABNORMAL HIGH (ref 70–99)

## 2012-12-28 SURGERY — EGD (ESOPHAGOGASTRODUODENOSCOPY)
Anesthesia: Moderate Sedation

## 2012-12-28 MED ORDER — FENTANYL CITRATE 0.05 MG/ML IJ SOLN
INTRAMUSCULAR | Status: AC
  Filled 2012-12-28: qty 2

## 2012-12-28 MED ORDER — FENTANYL CITRATE 0.05 MG/ML IJ SOLN
INTRAMUSCULAR | Status: DC | PRN
  Administered 2012-12-28: 12.5 ug via INTRAVENOUS
  Administered 2012-12-28: 25 ug via INTRAVENOUS
  Administered 2012-12-28: 12.5 ug via INTRAVENOUS
  Administered 2012-12-28: 25 ug via INTRAVENOUS

## 2012-12-28 MED ORDER — MIDAZOLAM HCL 10 MG/2ML IJ SOLN
INTRAMUSCULAR | Status: AC
  Filled 2012-12-28: qty 2

## 2012-12-28 MED ORDER — MIDAZOLAM HCL 10 MG/2ML IJ SOLN
INTRAMUSCULAR | Status: DC | PRN
  Administered 2012-12-28 (×4): 1 mg via INTRAVENOUS

## 2012-12-28 MED ORDER — SODIUM CHLORIDE 0.45 % IV SOLN: INTRAVENOUS | Status: DC

## 2012-12-28 MED ORDER — BUTAMBEN-TETRACAINE-BENZOCAINE 2-2-14 % EX AERO
INHALATION_SPRAY | CUTANEOUS | Status: DC | PRN
  Administered 2012-12-28: 2 via TOPICAL

## 2012-12-28 NOTE — H&P (View-Only) (Signed)
[facility] Progress Note  SUBJECTIVE: awoken him from sleep. Still having abdominal pain. Pain meds help but not quite strong enough. Hungry  OBJECTIVE:  Vital signs in last 24 hours: Temp:  [97.5 F (36.4 C)-99 F (37.2 C)] 98.3 F (36.8 C) (02/18 0535) Pulse Rate:  [72-89] 72 (02/18 0535) Resp:  [16-20] 16 (02/18 0535) BP: (114-134)/(56-72) 119/62 mmHg (02/18 0535) SpO2:  [90 %-92 %] 92 % (02/18 0535) Weight:  [244 lb 7.8 oz (110.9 kg)] 244 lb 7.8 oz (110.9 kg) (02/18 0238) Last BM Date: 12/25/12 General:    white male in NAD Abdomen:  Soft, nondistended. Moderate RUQ tenderness, hypoactive bowel sounds. Neurologic:  Alert and oriented,  grossly normal neurologically. Psych:  Cooperative. Normal mood and affect.   Lab Results:  Recent Labs  12/26/12 0522 12/28/12 0350  WBC 12.1* 8.6  HGB 12.3* 11.6*  HCT 37.9* 35.5*  PLT 391 397   BMET  Recent Labs  12/26/12 0522 12/28/12 0350  NA 131* 135  K 4.3 3.5  CL 95* 97  CO2 27 30  GLUCOSE 168* 150*  BUN 15 10  CREATININE 0.99 1.08  CALCIUM 8.7 8.3*   LFT  Recent Labs  12/28/12 0350  PROT 6.4  ALBUMIN 3.0*  AST 9  ALT 8  ALKPHOS 50  BILITOT 0.3   PT/INR  Recent Labs  12/26/12 0522  LABPROT 12.7  INR 0.96     ASSESSMENT / PLAN:   Duodenal inflammatory process. Still requiring frequent pain meds. Rule out PUD, duodenal diverticulitis, foreign body perforation, or ischemia. WBC now normal. Patient afebrile. He is on #4 day of Zosyn. Continue BID IV PPI.  KUB to be done today and if negative for free air will proceed with EGD for further evaluation.    LOS: 3 days   Willette Cluster  12/28/2012, 9:01 AM   ________________________________________________________________________  [facility] MD note:  I  reviewed the data and agree with the assessment and plan described above.  Abd films this AM show no sign of [employer]. Planning on EGD later this morning.   Rob Bunting,  MD [facility] Pager [phone]

## 2012-12-28 NOTE — Interval H&P Note (Signed)
History and Physical Interval Note:  12/28/2012 11:27 AM  Joseph Manning  has presented today for surgery, with the diagnosis of abnormal CTscan, duodenitis  The various methods of treatment have been discussed with the patient and family. After consideration of risks, benefits and other options for treatment, the patient has consented to  Procedure(s): ESOPHAGOGASTRODUODENOSCOPY (EGD) (N/A) as a surgical intervention .  The patient's history has been reviewed, patient examined, no change in status, stable for surgery.  I have reviewed the patient's chart and labs.  Questions were answered to the patient's satisfaction.     Rob Bunting

## 2012-12-28 NOTE — Progress Notes (Signed)
Subjective: He was asleep and briefly opened eyes with exam. No problems with nausea or vomiting.  Objective: Vital signs in last 24 hours: Temp:  [97.5 F (36.4 C)-99 F (37.2 C)] 98.3 F (36.8 C) (02/18 0535) Pulse Rate:  [72-89] 72 (02/18 0535) Resp:  [16-20] 16 (02/18 0535) BP: (114-134)/(56-72) 119/62 mmHg (02/18 0535) SpO2:  [90 %-92 %] 92 % (02/18 0535) Weight:  [110.9 kg (244 lb 7.8 oz)] 110.9 kg (244 lb 7.8 oz) (02/18 0238) Weight change: -0.5 kg (-1 lb 1.6 oz)   Intake/Output from previous day: 02/17 0701 - 02/18 0700 In: 120 [P.O.:120] Out: 600 [Urine:600]   General appearance: no distress Resp: clear to auscultation bilaterally Cardio: regular rate and rhythm, S1, S2 normal, no murmur, click, rub or gallop GI: soft, non-tender; bowel sounds normal; no masses,  no organomegaly  Lab Results:  Recent Labs  12/26/12 0522 12/28/12 0350  WBC 12.1* 8.6  HGB 12.3* 11.6*  HCT 37.9* 35.5*  PLT 391 397   BMET  Recent Labs  12/26/12 0522 12/28/12 0350  NA 131* 135  K 4.3 3.5  CL 95* 97  CO2 27 30  GLUCOSE 168* 150*  BUN 15 10  CREATININE 0.99 1.08  CALCIUM 8.7 8.3*   CMET CMP     Component Value Date/Time   NA 135 12/28/2012 0350   K 3.5 12/28/2012 0350   CL 97 12/28/2012 0350   CO2 30 12/28/2012 0350   GLUCOSE 150* 12/28/2012 0350   BUN 10 12/28/2012 0350   CREATININE 1.08 12/28/2012 0350   CALCIUM 8.3* 12/28/2012 0350   PROT 6.4 12/28/2012 0350   ALBUMIN 3.0* 12/28/2012 0350   AST 9 12/28/2012 0350   ALT 8 12/28/2012 0350   ALKPHOS 50 12/28/2012 0350   BILITOT 0.3 12/28/2012 0350   GFRNONAA 72* 12/28/2012 0350   GFRAA 83* 12/28/2012 0350    CBG (last 3)   Recent Labs  12/28/12 0009 12/28/12 0534 12/28/12 0737  GLUCAP 152* 150* 151*    INR RESULTS:   Lab Results  Component Value Date   INR 0.96 12/26/2012     Studies/Results: No results found.  Medications: I have reviewed the patient's current medications.  Assessment/Plan: #1  Duodenitis: stable on PPI treatment and likely due to a duodenal ulcer. Agree with plans for EGD today. If he continues to improve can begin advancing his diet this evening. #2 DM2: stable on SSI   LOS: 3 days   Lamiracle Chaidez G 12/28/2012, 9:01 AM

## 2012-12-28 NOTE — Op Note (Signed)
[facility] [address] [place], [address]   ENDOSCOPY PROCEDURE REPORT  PATIENT: Joseph Manning, Joseph Manning  MR#: [id number] BIRTHDATE: 07-15-1951 , 61  yrs. old GENDER: Male ENDOSCOPIST: Rachael Fee, MD PROCEDURE DATE:  12/28/2012 PROCEDURE:  EGD w/ biopsy ASA CLASS:     Class III INDICATIONS:  abdoiminal pain, severe duodenitis on CT scan at admission. MEDICATIONS: Fentanyl 75 mcg IV and Versed 4 mg IV TOPICAL ANESTHETIC: Cetacaine Spray DESCRIPTION OF PROCEDURE: After the risks benefits and alternatives of the procedure were thoroughly explained, informed consent was obtained.  The Pentax Gastroscope [id number] endoscope was introduced through the mouth and advanced to the second portion of the duodenum. Without limitations.  The instrument was slowly withdrawn as the mucosa was fully examined  There was mild, non-specific distal gastritis.  This was biopsied and sent to pathology.  There was abnormal, very inflammed appearing duodenal mucosa starting at distal end of the bulb, circumferentially.  This inflammation continued in circumferential fashion for abour 3cm, most prominant along outer curve of duodenal sweep.  This was not clearly neoplastic and I also could not see a clear ulcer crater within the inflammation however visualization was not ideal.  The inflammed mucosa created mild stenosis but did not limit passage of gastroscope into more distal bowel.  Biopsies were taken from the duodenum and sent to pathology.  THe examination was otherwise normal.  Retroflexed views revealed no abnormalities.     The scope was then withdrawn from the patient and the procedure completed. COMPLICATIONS: There were no complications.  ENDOSCOPIC IMPRESSION: There was mild, non-specific distal gastritis.  This was biopsied and sent to pathology.  There was abnormal, very inflammed appearing duodenal mucosa starting at distal end of the bulb, circumferentially.  This  inflammation continued in circumferential fashion for abour 3cm, most prominant along outer curve of duodenal sweep.  This was not clearly neoplastic and I also could not see a clear ulcer crater within the inflammation however visualization was not ideal.  The inflammed mucosa created mild stenosis but did not limit passage of gastroscope into more distal bowel.  Biopsies were taken from the duodenum and sent to pathology.  THe examination was otherwise normal. RECOMMENDATIONS: Advance diet as tolerated (full liquids today).  Continue PPI twice daily.  Await final pathology from dudoenal and gastric biopsies.   eSigned:  Rachael Fee, MD 12/28/2012 12:03 PM   XB:JYNW Marina Goodell, MD

## 2012-12-28 NOTE — Progress Notes (Signed)
[facility] Progress Note  SUBJECTIVE: awoken him from sleep. Still having abdominal pain. Pain meds help but not quite strong enough. Hungry  OBJECTIVE:  Vital signs in last 24 hours: Temp:  [97.5 F (36.4 C)-99 F (37.2 C)] 98.3 F (36.8 C) (02/18 0535) Pulse Rate:  [72-89] 72 (02/18 0535) Resp:  [16-20] 16 (02/18 0535) BP: (114-134)/(56-72) 119/62 mmHg (02/18 0535) SpO2:  [90 %-92 %] 92 % (02/18 0535) Weight:  [244 lb 7.8 oz (110.9 kg)] 244 lb 7.8 oz (110.9 kg) (02/18 0238) Last BM Date: 12/25/12 General:    white male in NAD Abdomen:  Soft, nondistended. Moderate RUQ tenderness, hypoactive bowel sounds. Neurologic:  Alert and oriented,  grossly normal neurologically. Psych:  Cooperative. Normal mood and affect.   Lab Results:  Recent Labs  12/26/12 0522 12/28/12 0350  WBC 12.1* 8.6  HGB 12.3* 11.6*  HCT 37.9* 35.5*  PLT 391 397   BMET  Recent Labs  12/26/12 0522 12/28/12 0350  NA 131* 135  K 4.3 3.5  CL 95* 97  CO2 27 30  GLUCOSE 168* 150*  BUN 15 10  CREATININE 0.99 1.08  CALCIUM 8.7 8.3*   LFT  Recent Labs  12/28/12 0350  PROT 6.4  ALBUMIN 3.0*  AST 9  ALT 8  ALKPHOS 50  BILITOT 0.3   PT/INR  Recent Labs  12/26/12 0522  LABPROT 12.7  INR 0.96     ASSESSMENT / PLAN:   Duodenal inflammatory process. Still requiring frequent pain meds. Rule out PUD, duodenal diverticulitis, foreign body perforation, or ischemia. WBC now normal. Patient afebrile. He is on #4 day of Zosyn. Continue BID IV PPI.  KUB to be done today and if negative for free air will proceed with EGD for further evaluation.    LOS: 3 days   Paula Guenther  12/28/2012, 9:01 AM   ________________________________________________________________________  [facility] MD note:  I  reviewed the data and agree with the assessment and plan described above.  Abd films this AM show no sign of Free Air. Planning on EGD later this morning.   Daniel Jacobs,  MD [facility] Pager [phone]  

## 2012-12-28 NOTE — OR Nursing (Signed)
Pt brought to endoscopy for EGD and found to have o2 sats of 88 - 89%.  O2 applied via nasal cannula at 2 L/min.  O2 sats up to 91 after 5 min.  Will continue to monitor.

## 2012-12-29 ENCOUNTER — Encounter (HOSPITAL_COMMUNITY): Payer: Self-pay | Admitting: Gastroenterology

## 2012-12-29 LAB — COMPREHENSIVE METABOLIC PANEL
Alkaline Phosphatase: 53 U/L (ref 39–117)
BUN: 7 mg/dL (ref 6–23)
Creatinine, Ser: 0.97 mg/dL (ref 0.50–1.35)
GFR calc Af Amer: >90 mL/min (ref >90)
Glucose, Bld: 160 mg/dL — ABNORMAL HIGH (ref 70–99)
Potassium: 3.8 mEq/L (ref 3.5–5.1)
Total Protein: 6.5 g/dL (ref 6.0–8.3)

## 2012-12-29 LAB — CBC
HCT: 37.5 % — ABNORMAL LOW (ref 39.0–52.0)
Platelets: 418 10*3/uL — ABNORMAL HIGH (ref 150–400)
RDW: 12.8 % (ref 11.5–15.5)
WBC: 9.3 10*3/uL (ref 4.0–10.5)

## 2012-12-29 LAB — GLUCOSE, CAPILLARY
Glucose-Capillary: 171 mg/dL — ABNORMAL HIGH (ref 70–99)
Glucose-Capillary: 206 mg/dL — ABNORMAL HIGH (ref 70–99)

## 2012-12-29 MED ORDER — PANTOPRAZOLE SODIUM 40 MG PO TBEC
40.0000 mg | DELAYED_RELEASE_TABLET | Freq: Every day | ORAL | Status: DC
  Administered 2012-12-29: 40 mg via ORAL
  Filled 2012-12-29 (×2): qty 1

## 2012-12-29 NOTE — Progress Notes (Signed)
[facility] Progress Note  SUBJECTIVE: Still having upper abdominal pain, pain meds helping. Excited to start solid food at lunch  OBJECTIVE:  Vital signs in last 24 hours: Temp:  [97.7 F (36.5 C)-98.4 F (36.9 C)] 98.4 F (36.9 C) (02/19 4782) Pulse Rate:  [65-81] 74 (02/19 0633) Resp:  [14-27] 16 (02/19 0633) BP: (122-194)/(61-107) 141/76 mmHg (02/19 0633) SpO2:  [89 %-94 %] 92 % (02/19 0633) Last BM Date: 12/27/12 General:    white male in NAD Abdomen:  Soft, obese, moderate RUQ tenderness. Norma bowel sounds. Extremities:  Without edema. Neurologic:  Alert and oriented,  grossly normal neurologically. Psych:  Cooperative. Normal mood and affect.    Lab Results:  Recent Labs  12/28/12 0350 12/29/12 0340  WBC 8.6 9.3  HGB 11.6* 12.1*  HCT 35.5* 37.5*  PLT 397 418*   BMET  Recent Labs  12/28/12 0350 12/29/12 0340  NA 135 137  K 3.5 3.8  CL 97 99  CO2 30 30  GLUCOSE 150* 160*  BUN 10 7  CREATININE 1.08 0.97  CALCIUM 8.3* 8.9   LFT  Recent Labs  12/29/12 0340  PROT 6.5  ALBUMIN 3.2*  AST 10  ALT 11  ALKPHOS 53  BILITOT 0.4   EGD 11/27/12: There was mild, non-specific distal gastritis. This was biopsied  and sent to pathology. There was abnormal, very inflammed  appearing duodenal mucosa starting at distal end of the bulb,  circumferentially. This inflammation continued in circumferential  fashion for abour 3cm, most prominant along outer curve of duodenal  sweep. This was not clearly neoplastic and I also could not see a  clear ulcer crater within the inflammation however visualization  was not ideal. The inflammed mucosa created mild stenosis but did  not limit passage of gastroscope into more distal bowel. Biopsies  were taken from the duodenum and sent to pathology. The  examination was otherwise normal   ASSESSMENT / PLAN:  Duodenal inflammatory process. EGD findings as above. Biopsies still pending. Patient Still requiring  frequent pain meds but tolerating liquids. Will see how he does with solids today.  He is on #5 day of Zosyn. Will change PPI to PO BID.  Further recommendations following biopsy results.    LOS: 4 days   Willette Cluster  12/29/2012, 8:56 AM   ________________________________________________________________________  [facility] MD note:  I personally examined the patient, reviewed the data and agree with the assessment and plan described above.  Will change to PO abx tomorrow (augmentin) if he tolerates diet increase today.     Rob Bunting, MD [facility] Pager [phone]

## 2012-12-29 NOTE — Progress Notes (Signed)
Subjective: Continues to have right sided abdominal pain. "I want a hamburger."  Objective: Vital signs in last 24 hours: Temp:  [97.7 F (36.5 C)-98.4 F (36.9 C)] 98.4 F (36.9 C) (02/19 1914) Pulse Rate:  [65-81] 74 (02/19 0633) Resp:  [14-27] 16 (02/19 0633) BP: (122-194)/(61-107) 141/76 mmHg (02/19 0633) SpO2:  [89 %-94 %] 92 % (02/19 7829) Weight change:    Intake/Output from previous day: 01-22-23 0701 - 02/19 0700 In: 2072 [P.O.:480; I.V.:1592] Out: -    General appearance: alert, cooperative and mild distress Resp: clear to auscultation bilaterally Cardio: regular rate and rhythm, S1, S2 normal, no murmur, click, rub or gallop GI: soft, non-tender; bowel sounds normal; no masses,  no organomegaly  Lab Results:  Recent Labs  01/22/13 0350 12/29/12 0340  WBC 8.6 9.3  HGB 11.6* 12.1*  HCT 35.5* 37.5*  PLT 397 418*   BMET  Recent Labs  January 22, 2013 0350 12/29/12 0340  NA 135 137  K 3.5 3.8  CL 97 99  CO2 30 30  GLUCOSE 150* 160*  BUN 10 7  CREATININE 1.08 0.97  CALCIUM 8.3* 8.9   CMET CMP     Component Value Date/Time   NA 137 12/29/2012 0340   K 3.8 12/29/2012 0340   CL 99 12/29/2012 0340   CO2 30 12/29/2012 0340   GLUCOSE 160* 12/29/2012 0340   BUN 7 12/29/2012 0340   CREATININE 0.97 12/29/2012 0340   CALCIUM 8.9 12/29/2012 0340   PROT 6.5 12/29/2012 0340   ALBUMIN 3.2* 12/29/2012 0340   AST 10 12/29/2012 0340   ALT 11 12/29/2012 0340   ALKPHOS 53 12/29/2012 0340   BILITOT 0.4 12/29/2012 0340   GFRNONAA 87* 12/29/2012 0340   GFRAA >90 12/29/2012 0340    CBG (last 3)   Recent Labs  January 22, 2013 1348 22-Jan-2013 1736 2013/01/22 2125  GLUCAP 124* 162* 128*    INR RESULTS:   Lab Results  Component Value Date   INR 0.96 12/26/2012     Studies/Results: Dg Abd 2 Views  22-Jan-2013  *RADIOLOGY REPORT*  Clinical Data: Right abdominal pain, evaluate for free air  ABDOMEN - 2 VIEW  Comparison: 12/26/2012  Findings: Nonobstructive bowel gas pattern.  Contrast in  the right colon.  No evidence of free air under the diaphragm on the upright view.  IMPRESSION: No evidence of small bowel obstruction or free air.   Original Report Authenticated By: Charline Bills, M.D.     Medications: I have reviewed the patient's current medications.  Assessment/Plan: #1 Duodenitis: clinically improving, and will see how he tolerates his full liquids breakfast then advance diet today, possible discharge tomorrow. #2 DM2: stable on current meds.    LOS: 4 days   Aashka Salomone G 12/29/2012, 8:14 AM

## 2012-12-30 LAB — COMPREHENSIVE METABOLIC PANEL
Albumin: 3 g/dL — ABNORMAL LOW (ref 3.5–5.2)
BUN: 10 mg/dL (ref 6–23)
Creatinine, Ser: 1.19 mg/dL (ref 0.50–1.35)
Potassium: 3.7 mEq/L (ref 3.5–5.1)
Total Protein: 6 g/dL (ref 6.0–8.3)

## 2012-12-30 LAB — CBC
HCT: 34.1 % — ABNORMAL LOW (ref 39.0–52.0)
Hemoglobin: 11.5 g/dL — ABNORMAL LOW (ref 13.0–17.0)
MCV: 94.2 fL (ref 78.0–100.0)
RBC: 3.62 MIL/uL — ABNORMAL LOW (ref 4.22–5.81)
WBC: 9 10*3/uL (ref 4.0–10.5)

## 2012-12-30 LAB — GLUCOSE, CAPILLARY

## 2012-12-30 MED ORDER — SENNOSIDES-DOCUSATE SODIUM 8.6-50 MG PO TABS
1.0000 | ORAL_TABLET | Freq: Every evening | ORAL | Status: DC | PRN
  Filled 2012-12-30: qty 1

## 2012-12-30 MED ORDER — AMOXICILLIN-POT CLAVULANATE 875-125 MG PO TABS: 1.0000 | ORAL_TABLET | Freq: Two times a day (BID) | ORAL | Status: DC

## 2012-12-30 MED ORDER — SENNOSIDES-DOCUSATE SODIUM 8.6-50 MG PO TABS: 1.0000 | ORAL_TABLET | Freq: Every evening | ORAL | Status: DC | PRN

## 2012-12-30 MED ORDER — PANTOPRAZOLE SODIUM 40 MG PO TBEC: 40.0000 mg | DELAYED_RELEASE_TABLET | Freq: Every day | ORAL | Status: DC

## 2012-12-30 NOTE — Discharge Summary (Addendum)
Physician Discharge Summary  Patient ID: Joseph Manning MRN: [id number] DOB/AGE: [birth year] 61 y.o.  Admit date: 12/25/2012 Discharge date: 12/30/2012   Discharge Diagnoses:  Principal Problem:   Duodenitis Active Problems:   Chronic back pain   Leukocytosis   Smoker   Diabetes mellitus, type II   Hypertension   Anxiety   Discharged Condition: good  Hospital Course:  Patient is a 62 year old man who is well-known to me. He presented to the emergency room with complaint of severe right upper quadrant pain. He recently had a right upper quadrant ultrasound exam that showed a normal gallbladder and a fatty liver. Pain was sharp and worse with inspiration, and not associated with fever or chills. In the emergency room his workup was most significant for a CT scan of the abdomen and pelvis that showed significant duodenitis, so he was admitted for further evaluation. He was treated with high-dose proton pump inhibitor treatment and antibiotics. He was seen by a GI consultant (Dr. Christella Hartigan) who did an endoscopy study on February 18 that showed distal gastritis with a very inflamed duodenal mucosa extending circumferentially about 3 cm in the second portion of the duodenum. There was mild duodenal stenosis that did not restrict passage of the scope. 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 He did well with advancing his diet to a regular diet not having any problems with nausea or vomiting. On the day of discharge she felt fine other than having mild right upper quadrant abdominal pain, however he was able to eat breakfast without difficulty. He has not had any fever, chills, diarrhea, or constipation.   Consults: GI  Significant Diagnostic Studies:  Dg Abd 2 Views  12/28/2012  *RADIOLOGY REPORT*  Clinical Data: Right abdominal pain, evaluate for free air  ABDOMEN - 2 VIEW  Comparison: 12/26/2012  Findings: Nonobstructive  bowel gas pattern.  Contrast in the right colon.  No evidence of free air under the diaphragm on the upright view.  IMPRESSION: No evidence of small bowel obstruction or free air.   Original Report Authenticated By: Charline Bills, M.D.     Labs: Lab Results  Component Value Date   WBC 9.0 12/30/2012   HGB 11.5* 12/30/2012   HCT 34.1* 12/30/2012   MCV 94.2 12/30/2012   PLT 387 12/30/2012      Recent Labs Lab 12/30/12 0359  NA 137  K 3.7  CL 102  CO2 29  BUN 10  CREATININE 1.19  CALCIUM 8.6  PROT 6.0  BILITOT 0.2*  ALKPHOS 51  ALT 11  AST 9  GLUCOSE 209*       Lab Results  Component Value Date   INR 0.96 12/26/2012     No results found for this or any previous visit (from the past 240 hour(s)).    Discharge Exam: Blood pressure 112/52, pulse 65, temperature 98.9 F (37.2 C), temperature source Oral, resp. rate 16, height 5\' 8"  (1.727 m), weight 111.721 kg (246 lb 4.8 oz), SpO2 92.00%.  Physical Exam: In general, he is an obese white man who was in no apparent distress while lying partially upright in bed. He has moderately impaired hearing bilaterally. HEENT exam was within normal limits, neck was supple without jugular venous distention, chest was clear to auscultation, heart had a regular rate and rhythm with a 2/6 systolic ejection murmur, abdomen had normal bowel sounds and no significant tenderness. Extremities were without cyanosis, clubbing, or edema.   Disposition:  He will be discharged to home on proton pump inhibitor treatment as well as Augmentin. He will followup with myself and with Dr. Christella Hartigan as described below.      Discharge Orders   Future Orders Complete By Expires     Call MD for:  As directed     Comments:      Call Dr. Silvano Rusk office if you develop fever, chills, severe pain or diarrhea, or other concerning symptoms. Do not take any non steroidal antiinflammatory drugs such as ibuprofen, aleve, advil, naprosyn.    Diet - low sodium heart  healthy  As directed     Discharge instructions  As directed     Comments:      Take new medicines for ulcer as prescribed. Call [facility] at [phone] today to set up a hospital followup visit within 1 week of hospital discharge. Call [facility] to set up a hospital followup visit in 1 month after discharge.    Increase activity slowly  As directed         Medication List    TAKE these medications       amoxicillin-clavulanate 875-125 MG per tablet  Commonly known as:  AUGMENTIN  Take 1 tablet by mouth 2 (two) times daily.     diazepam 10 MG tablet  Commonly known as:  VALIUM  Take 10 mg by mouth every 6 (six) hours as needed for anxiety.     insulin NPH-insulin regular (70-30) 100 UNIT/ML injection  Commonly known as:  NOVOLIN 70/30  Inject 24-34 Units into the skin 2 (two) times daily with a meal.     lisinopril 10 MG tablet  Commonly known as:  PRINIVIL,ZESTRIL  Take 10 mg by mouth daily.     oxyCODONE-acetaminophen 5-325 MG per tablet  Commonly known as:  PERCOCET/ROXICET  Take 1 tablet by mouth every 4 (four) hours as needed for pain.     pantoprazole 40 MG tablet  Commonly known as:  PROTONIX  Take 1 tablet (40 mg total) by mouth daily.     senna-docusate 8.6-50 MG per tablet  Commonly known as:  Senokot-S  Take 1 tablet by mouth at bedtime as needed for constipation.         Signed: Garlan Fillers 12/30/2012, 7:11 AM

## 2013-01-03 ENCOUNTER — Encounter: Payer: Self-pay | Admitting: Gastroenterology

## 2013-01-17 ENCOUNTER — Ambulatory Visit: Payer: Medicaid Other | Admitting: Gastroenterology

## 2013-01-17 ENCOUNTER — Telehealth: Payer: Self-pay | Admitting: Gastroenterology

## 2013-01-17 NOTE — Telephone Encounter (Signed)
OK for no charge this one time for late cancellation

## 2013-01-17 NOTE — Telephone Encounter (Signed)
Do you want to charge? 

## 2013-02-04 ENCOUNTER — Ambulatory Visit: Payer: Medicaid Other | Admitting: Gastroenterology

## 2013-02-08 ENCOUNTER — Telehealth: Payer: Self-pay | Admitting: Gastroenterology

## 2013-02-08 NOTE — Telephone Encounter (Signed)
Please have a nurse review this triage. Pt is in the process of being discharged for our practice for no showing for appts.

## 2013-02-08 NOTE — Telephone Encounter (Signed)
Pt states the pain is back in his right side like it was when he was seen in the hospital. Pt requesting to be seen. Let pt know we did not have any appts today but we could see him tomorrow. Pt scheduled to see Doug Sou PA tomorrow at 3pm. Pt aware of appt date and time.

## 2013-02-09 ENCOUNTER — Encounter: Payer: Self-pay | Admitting: Gastroenterology

## 2013-02-09 ENCOUNTER — Other Ambulatory Visit (INDEPENDENT_AMBULATORY_CARE_PROVIDER_SITE_OTHER): Payer: Medicaid Other

## 2013-02-09 ENCOUNTER — Ambulatory Visit (INDEPENDENT_AMBULATORY_CARE_PROVIDER_SITE_OTHER): Payer: Medicaid Other | Admitting: Gastroenterology

## 2013-02-09 VITALS — BP 90/62 | HR 120 | Ht 68.5 in | Wt 247.0 lb

## 2013-02-09 DIAGNOSIS — R109 Unspecified abdominal pain: Secondary | ICD-10-CM

## 2013-02-09 DIAGNOSIS — K2980 Duodenitis without bleeding: Secondary | ICD-10-CM

## 2013-02-09 DIAGNOSIS — R1011 Right upper quadrant pain: Secondary | ICD-10-CM | POA: Insufficient documentation

## 2013-02-09 LAB — COMPREHENSIVE METABOLIC PANEL
ALT: 17 U/L (ref 0–53)
AST: 14 U/L (ref 0–37)
Alkaline Phosphatase: 74 U/L (ref 39–117)
CO2: 27 mEq/L (ref 19–32)
Creatinine, Ser: 1.5 mg/dL (ref 0.4–1.5)
GFR: 50.67 mL/min — ABNORMAL LOW (ref >60.00)
Sodium: 134 mEq/L — ABNORMAL LOW (ref 135–145)
Total Bilirubin: 0.4 mg/dL (ref 0.3–1.2)
Total Protein: 7.5 g/dL (ref 6.0–8.3)

## 2013-02-09 LAB — CBC WITH DIFFERENTIAL/PLATELET
Basophils Absolute: 0.1 10*3/uL (ref 0.0–0.1)
Eosinophils Absolute: 0.1 10*3/uL (ref 0.0–0.7)
HCT: 44.2 % (ref 39.0–52.0)
Hemoglobin: 15 g/dL (ref 13.0–17.0)
Lymphs Abs: 1.6 10*3/uL (ref 0.7–4.0)
MCHC: 33.9 g/dL (ref 30.0–36.0)
MCV: 91.8 fl (ref 78.0–100.0)
Monocytes Absolute: 0.5 10*3/uL (ref 0.1–1.0)
Monocytes Relative: 5.5 % (ref 3.0–12.0)
Neutro Abs: 7.1 10*3/uL (ref 1.4–7.7)
Platelets: 341 10*3/uL (ref 150.0–400.0)
RDW: 13.7 % (ref 11.5–14.6)

## 2013-02-09 MED ORDER — PANTOPRAZOLE SODIUM 40 MG PO TBEC: 40.0000 mg | DELAYED_RELEASE_TABLET | Freq: Two times a day (BID) | ORAL | Status: DC

## 2013-02-09 NOTE — Patient Instructions (Addendum)
You have been given a separate informational sheet regarding your tobacco use, the importance of quitting and local resources to help you quit. It has been recommended to you that you have and Endoscopy done at [facility]. You will need to find transportation to pick you up We have given you information on transportation  With Appointment Mate Go to the basement for labs today

## 2013-02-09 NOTE — Progress Notes (Signed)
Strongly suspect persistent or recurrent duodenitis on noted at his recent EGD by Dr. Christella Hartigan.  He has not been compliant with PPI therapy and has not been compliant recommended GI follow up-he has missed 2 appointments. He should have an EGD at some point but may defer until he has been on PPI BID for several weeks. Pt is unsure if he can arrange for a care partner for EGD. Strictly avoid ASA/NSAIDs.   Venita Lick. Russella Dar, MD [facility]

## 2013-02-09 NOTE — Progress Notes (Signed)
02/09/2013 Joseph Manning [id number] Sep 23, 1951   History of Present Illness: Patient is a 62 year old male who presents to our office today with complaints of abdominal pain.  He was recently hospitalized for similar pain in February and was found to have duodenitis on CT scan.  EGD was performed by Dr. Christella Hartigan on 2/18, which showed "There was mild, non-specific distal gastritis. This was biopsied and sent to pathology. There was abnormal, very inflammed appearing duodenal mucosa starting at distal end of the bulb, circumferentially. This inflammation continued in circumferential fashion for about 3cm, most prominant along outer curve of duodenal sweep. This was not clearly neoplastic and I also could not see a clear ulcer crater within the inflammation however visualization was not ideal. The inflammed mucosa created mild stenosis but did  not limit passage of gastroscope into more distal bowel. Biopsies were taken from the duodenum and sent to pathology. 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  Stomach biopsies showed reactive gastropathy with surface ulceration.  He says that the pain never completely went away, but it worsened again the other night; says that it was worse than when he was in the hospital.  It is in the exact same spot that it has always been.  No vomiting.  Tolerating diet, but says that he has lost approximately 15 pounds in the past couple of months.  He was discharged from the hospital on augmentin, which is now complete.  Was also sent on BID protonix, but is not longer taking that medication.   Current Medications, Allergies, Past Medical History, Past Surgical History, Family History and Social History were reviewed in [employer] record.   Physical Exam: BP 90/62  Pulse 120  Ht 5' 8.5" (1.74 m)  Wt 247 lb (112.038 kg)  BMI 37.01  kg/m2 General: Well developed, white male in no acute distress; unkept Head: Normocephalic and atraumatic Eyes:  sclerae anicteric, conjunctiva pink  Ears: Normal auditory acuity Lungs: Light wheezing heard B/L Heart: Tachy but regular rhythm Abdomen: Soft, obese, non-distended. No masses, no hepatomegaly. Bowel sounds present.  RUQ TTP without R/R/G. Musculoskeletal: Symmetrical with no gross deformities  Extremities: No edema  Neurological: Alert oriented x 4, grossly nonfocal Psychological:  Alert and cooperative. Normal mood and affect  Assessment and Recommendations: -Duodenitis:  Significant inflammation seen on EGD in February.  Presenting with same abdominal that he had previously.  Not currently on a PPI.  Will restart him on BID PPI (prescription sent to pharmacy).  Will schedule him for repeat EGD, however, there are some transportation issues, etc that need to be addressed, therefore, we will contact him with date and time for procedure; information given to him about transportation services.  Check CBC and CMP today.  Advised him to take a liquid diet for the next couple of days and return to the ED if pain worsens.

## 2013-02-14 ENCOUNTER — Telehealth: Payer: Self-pay

## 2013-02-14 NOTE — Telephone Encounter (Signed)
Message copied by Annett Fabian on Mon Feb 14, 2013  9:28 AM ------      Message from: Doug Sou D      Created: Wed Feb 09, 2013  4:20 PM      Regarding: Schedule EGD       [place] Frieden/Linda,            Please schedule this patient for an EGD as soon as he possibly can.  Was not schedule at patient's visit because he did not have a ride to the procedure, but is supposed to be working on that issue.              Thank you,            Jess ------

## 2013-02-14 NOTE — Telephone Encounter (Signed)
Patient is scheduled for pre-visit 02/28/13 2:00 and EGD 03/09/13 1:30.  He has not been able to secure a ride yet, but will work on it.

## 2013-02-15 ENCOUNTER — Telehealth: Payer: Self-pay | Admitting: Gastroenterology

## 2013-02-15 NOTE — Telephone Encounter (Signed)
Dismissal Letter sent by Certified Mail 02/15/2013  Received the Return Receipt showing the Dismissal Letter has been picked up 02/17/2013

## 2013-03-09 ENCOUNTER — Encounter: Payer: Medicaid Other | Admitting: Gastroenterology

## 2014-01-12 ENCOUNTER — Other Ambulatory Visit: Payer: Self-pay | Admitting: Gastroenterology

## 2014-08-26 ENCOUNTER — Encounter (HOSPITAL_COMMUNITY): Payer: Self-pay | Admitting: Emergency Medicine

## 2014-08-26 ENCOUNTER — Emergency Department (HOSPITAL_COMMUNITY)
Admission: EM | Admit: 2014-08-26 | Discharge: 2014-08-26 | Disposition: A | Discharge status: Home / Self Care | Payer: Medicaid Other | Attending: Emergency Medicine | Admitting: Emergency Medicine

## 2014-08-26 ENCOUNTER — Emergency Department (HOSPITAL_COMMUNITY): Payer: Medicaid Other

## 2014-08-26 DIAGNOSIS — Z794 Long term (current) use of insulin: Secondary | ICD-10-CM | POA: Insufficient documentation

## 2014-08-26 DIAGNOSIS — E119 Type 2 diabetes mellitus without complications: Secondary | ICD-10-CM | POA: Insufficient documentation

## 2014-08-26 DIAGNOSIS — Z79899 Other long term (current) drug therapy: Secondary | ICD-10-CM | POA: Diagnosis not present

## 2014-08-26 DIAGNOSIS — I10 Essential (primary) hypertension: Secondary | ICD-10-CM | POA: Insufficient documentation

## 2014-08-26 DIAGNOSIS — Z86010 Personal history of colonic polyps: Secondary | ICD-10-CM | POA: Diagnosis not present

## 2014-08-26 DIAGNOSIS — F329 Major depressive disorder, single episode, unspecified: Secondary | ICD-10-CM | POA: Diagnosis not present

## 2014-08-26 DIAGNOSIS — J441 Chronic obstructive pulmonary disease with (acute) exacerbation: Secondary | ICD-10-CM | POA: Insufficient documentation

## 2014-08-26 DIAGNOSIS — Z720 Tobacco use: Secondary | ICD-10-CM | POA: Insufficient documentation

## 2014-08-26 DIAGNOSIS — F419 Anxiety disorder, unspecified: Secondary | ICD-10-CM | POA: Insufficient documentation

## 2014-08-26 DIAGNOSIS — R0602 Shortness of breath: Secondary | ICD-10-CM | POA: Diagnosis present

## 2014-08-26 LAB — CBC WITH DIFFERENTIAL/PLATELET
BASOS ABS: 0 10*3/uL (ref 0.0–0.1)
Basophils Relative: 0 % (ref 0–1)
EOS PCT: 3 % (ref 0–5)
Eosinophils Absolute: 0.3 10*3/uL (ref 0.0–0.7)
HEMATOCRIT: 45.5 % (ref 39.0–52.0)
Hemoglobin: 15.2 g/dL (ref 13.0–17.0)
LYMPHS ABS: 2.6 10*3/uL (ref 0.7–4.0)
Lymphocytes Relative: 24 % (ref 12–46)
MCH: 32.3 pg (ref 26.0–34.0)
MCHC: 33.4 g/dL (ref 30.0–36.0)
MCV: 96.8 fL (ref 78.0–100.0)
MONO ABS: 0.8 10*3/uL (ref 0.1–1.0)
Monocytes Relative: 8 % (ref 3–12)
Neutro Abs: 6.9 10*3/uL (ref 1.7–7.7)
Neutrophils Relative %: 65 % (ref 43–77)
Platelets: 298 10*3/uL (ref 150–400)
RBC: 4.7 MIL/uL (ref 4.22–5.81)
RDW: 12.9 % (ref 11.5–15.5)
WBC: 10.6 10*3/uL — ABNORMAL HIGH (ref 4.0–10.5)

## 2014-08-26 LAB — I-STAT CHEM 8, ED
BUN: 25 mg/dL — AB (ref 6–23)
CALCIUM ION: 1.16 mmol/L (ref 1.13–1.30)
CREATININE: 1.6 mg/dL — AB (ref 0.50–1.35)
Chloride: 105 mEq/L (ref 96–112)
Glucose, Bld: 265 mg/dL — ABNORMAL HIGH (ref 70–99)
HCT: 48 % (ref 39.0–52.0)
Hemoglobin: 16.3 g/dL (ref 13.0–17.0)
Potassium: 4.5 mEq/L (ref 3.7–5.3)
Sodium: 139 mEq/L (ref 137–147)
TCO2: 26 mmol/L (ref 0–100)

## 2014-08-26 MED ORDER — SODIUM CHLORIDE 0.9 % IV BOLUS (SEPSIS): 1000.0000 mL | Freq: Once | INTRAVENOUS | Status: DC

## 2014-08-26 MED ORDER — IPRATROPIUM BROMIDE 0.02 % IN SOLN
0.5000 mg | Freq: Once | RESPIRATORY_TRACT | Status: AC
  Administered 2014-08-26: 0.5 mg via RESPIRATORY_TRACT
  Filled 2014-08-26: qty 2.5

## 2014-08-26 MED ORDER — ALBUTEROL SULFATE HFA 108 (90 BASE) MCG/ACT IN AERS
2.0000 | INHALATION_SPRAY | RESPIRATORY_TRACT | Status: DC | PRN
  Administered 2014-08-26: 2 via RESPIRATORY_TRACT
  Filled 2014-08-26: qty 6.7

## 2014-08-26 MED ORDER — ALBUTEROL SULFATE (2.5 MG/3ML) 0.083% IN NEBU
5.0000 mg | INHALATION_SOLUTION | Freq: Once | RESPIRATORY_TRACT | Status: AC
  Administered 2014-08-26: 5 mg via RESPIRATORY_TRACT
  Filled 2014-08-26: qty 6

## 2014-08-26 MED ORDER — PREDNISONE 20 MG PO TABS: 40.0000 mg | ORAL_TABLET | Freq: Every day | ORAL | Status: DC

## 2014-08-26 MED ORDER — PREDNISONE 20 MG PO TABS
60.0000 mg | ORAL_TABLET | Freq: Once | ORAL | Status: AC
  Administered 2014-08-26: 60 mg via ORAL
  Filled 2014-08-26: qty 3

## 2014-08-26 MED ORDER — SODIUM CHLORIDE 0.9 % IV BOLUS (SEPSIS)
1000.0000 mL | Freq: Once | INTRAVENOUS | Status: AC
  Administered 2014-08-26: 1000 mL via INTRAVENOUS

## 2014-08-26 NOTE — ED Notes (Signed)
He states he awoke this morning with shortness of breath.  He states he did not have this problem yesterday and that this has never happened before.  He reeks of cigarette smoke and is short of breath and in no distress.  He states he took all of his usual meds this morning except his 70/30 insulin.

## 2014-08-26 NOTE — ED Notes (Signed)
He feels marginally better.  His wheezes persist, and he has pursed-lips breathing.  His skin is normal, warm and dry and he is moderately short of breath and in no distress.

## 2014-08-26 NOTE — ED Notes (Signed)
He is awake, alert and oriented x 3 with clear speech.  He remains mildly short of breath and in no distress.

## 2014-08-26 NOTE — ED Notes (Signed)
He is back from x-ray; and I have just phoned [facility].T. For additional h.h.n. Treatment.  His wheezes are now audible; and he is by degrees not so short of breath as he was.  He continues to deny any pain.

## 2014-08-26 NOTE — ED Provider Notes (Addendum)
CSN: [id number]     Arrival date & time 08/26/14  9562 History   First MD Initiated Contact with Patient 08/26/14 [phone]     Chief Complaint  Patient presents with  . Shortness of Breath     (Consider location/radiation/quality/duration/timing/severity/associated sxs/prior Treatment) Patient is a 63 y.o. male presenting with shortness of breath. The history is provided by the patient.  Shortness of Breath Severity:  Moderate Onset quality:  Sudden Duration:  2 hours Timing:  Constant Progression:  Unchanged Chronicity:  New Context comment:  Woke up this morning short of breath Relieved by:  Nothing Worsened by:  Activity and coughing Ineffective treatments:  Sitting up Associated symptoms: cough, sputum production and wheezing   Associated symptoms: no abdominal pain, no chest pain, no fever and no vomiting   Risk factors: tobacco use   Risk factors: no recent alcohol use, no hx of PE/DVT, no obesity, no prolonged immobilization and no recent surgery     Past Medical History  Diagnosis Date  . Diabetes mellitus without complication   . Hypertension   . Depression   . COPD (chronic obstructive pulmonary disease)   . Tubular adenoma of colon 12/1999  . Anxiety    Past Surgical History  Procedure Laterality Date  . Joint replacement Left   . Knee arthroscopy Left   . Cervical disc surgery    . Esophagogastroduodenoscopy N/A 12/28/2012    Procedure: ESOPHAGOGASTRODUODENOSCOPY (EGD);  Surgeon: Milus Banister, MD;  Location: [facility];  Service: Endoscopy;  Laterality: N/A;   Family History  Problem Relation Age of Onset  . Alzheimer's disease Mother   . Cancer Father     unknown type   History  Substance Use Topics  . Smoking status: Current Every Day Smoker -- 1.00 packs/day for 50 years    Types: Cigarettes  . Smokeless tobacco: Never Used  . Alcohol Use: No    Review of Systems  Constitutional: Negative for fever.  Respiratory: Positive for cough, sputum  production, shortness of breath and wheezing.   Cardiovascular: Negative for chest pain.  Gastrointestinal: Negative for vomiting and abdominal pain.  All other systems reviewed and are negative.     Allergies  Review of patient's allergies indicates no known allergies.  Home Medications   Prior to Admission medications   Medication Sig Start Date End Date Taking? Authorizing Provider  diazepam (VALIUM) 10 MG tablet Take 10 mg by mouth every 6 (six) hours as needed for anxiety.    Historical Provider, MD  insulin NPH-insulin regular (NOVOLIN 70/30) (70-30) 100 UNIT/ML injection Inject 24-34 Units into the skin 2 (two) times daily with a meal.     Historical Provider, MD  lisinopril (PRINIVIL,ZESTRIL) 10 MG tablet Take 10 mg by mouth daily.    Historical Provider, MD  oxyCODONE-acetaminophen (PERCOCET/ROXICET) 5-325 MG per tablet Take 1 tablet by mouth every 4 (four) hours as needed for pain.    Historical Provider, MD  pantoprazole (PROTONIX) 40 MG tablet Take 1 tablet (40 mg total) by mouth 2 (two) times daily. 02/09/13   Janett Billow D. Zehr, PA-C  senna-docusate (SENOKOT-S) 8.6-50 MG per tablet Take 1 tablet by mouth at bedtime as needed for constipation. 12/30/12   [name], MD   BP 119/66  Pulse 98  Temp(Src) 98.1 F (36.7 C) (Oral)  Resp 32  SpO2 92% Physical Exam  Nursing note and vitals reviewed. Constitutional: He is oriented to person, place, and time. He appears well-developed and well-nourished. No distress.  HENT:  Head: Normocephalic and atraumatic.  Mouth/Throat: Oropharynx is clear and moist.  Eyes: Conjunctivae and EOM are normal. Pupils are equal, round, and reactive to light.  Neck: Normal range of motion. Neck supple.  Cardiovascular: Regular rhythm and intact distal pulses.  Tachycardia present.   No murmur heard. Pulmonary/Chest: Effort normal. No respiratory distress. He has wheezes. He has no rales.  Abdominal: Soft. He exhibits no distension. There is no  tenderness. There is no rebound and no guarding.  Musculoskeletal: Normal range of motion. He exhibits no edema and no tenderness.  Neurological: He is alert and oriented to person, place, and time.  Skin: Skin is warm and dry. No rash noted. No erythema.  Psychiatric: He has a normal mood and affect. His behavior is normal.    ED Course  Procedures (including critical care time) Labs Review Labs Reviewed  CBC WITH DIFFERENTIAL - Abnormal; Notable for the following:    WBC 10.6 (*)    All other components within normal limits  I-STAT CHEM 8, ED - Abnormal; Notable for the following:    BUN 25 (*)    Creatinine, Ser 1.60 (*)    Glucose, Bld 265 (*)    All other components within normal limits    Imaging Review Dg Chest 2 View  08/26/2014   CLINICAL DATA:  Shortness of breath this morning.  Smoker.  EXAM: CHEST  2 VIEW  COMPARISON:  12/26/2012 and 12/25/2012  FINDINGS: Lungs are adequately inflated without consolidation or effusion. Cardiomediastinal silhouette and remainder the exam is unchanged.  IMPRESSION: No active cardiopulmonary disease.   Electronically Signed   By: Marin Olp M.D.   On: 08/26/2014 09:16     EKG Interpretation   Date/Time:  Saturday August 26 2014 08:15:50 EDT Ventricular Rate:  94 PR Interval:  161 QRS Duration: 88 QT Interval:  348 QTC Calculation: 435 R Axis:   73 Text Interpretation:  Sinus rhythm Low voltage, precordial leads No  significant change since last tracing Confirmed by Maryan Rued  MD, Charlyn Vialpando  [phone]) on 08/26/2014 8:23:28 AM      MDM   Final diagnoses:  COPD exacerbation   Pt with SOB with wheezing and hx of COPD and still smoking.  Does not currently use inhalers.  Denies CP, risk factors for PE and infectious sx.  Pt has no cardiac family hx but does have DM and HTN and uses tobacco.  Will do screening EKG.  No surgeries/immobilization or travel.  No unilateral leg pain.  Will treat for COPD exacerbation with  albuterol/atrovent and prednisone.  CBC, i-stat, EKG and CXR pending.  8:52 AM Labs stable and unchanged.  Pt wheezing more after first treatment and moving air better.  Will give second treatment.  10:27 AM Pt's wheezing now resolved.  He complains of feeling jittery after albuterol and mildly nauseated but has not eaten today.  Pt given a sandwich and will recheck blood pressure.  10:54 AM Pt is hypotensive here but assymptomatic.  He did take his BP meds prior to coming.  Will give IVF and recheck.   11:49 AM Pt is having some more wheezing and given third treatment.  1:28 PM BP improved after 1L.  Pt feeling better and requesting to go home.  Will d/c home with albuterol/prednisone and given strict return precautions.  Blanchie Dessert, MD 08/26/14 [place], MD 08/26/14 1330

## 2014-08-27 ENCOUNTER — Encounter (HOSPITAL_COMMUNITY): Payer: Self-pay | Admitting: Emergency Medicine

## 2014-08-27 ENCOUNTER — Inpatient Hospital Stay (HOSPITAL_COMMUNITY)
Admission: EM | Admit: 2014-08-27 | Discharge: 2014-08-29 | DRG: 192 | Disposition: A | Discharge status: Home / Self Care | Payer: Medicaid Other | Attending: Internal Medicine | Admitting: Internal Medicine

## 2014-08-27 DIAGNOSIS — D72829 Elevated white blood cell count, unspecified: Secondary | ICD-10-CM | POA: Diagnosis present

## 2014-08-27 DIAGNOSIS — F17210 Nicotine dependence, cigarettes, uncomplicated: Secondary | ICD-10-CM | POA: Diagnosis present

## 2014-08-27 DIAGNOSIS — F17200 Nicotine dependence, unspecified, uncomplicated: Secondary | ICD-10-CM

## 2014-08-27 DIAGNOSIS — G8929 Other chronic pain: Secondary | ICD-10-CM | POA: Diagnosis present

## 2014-08-27 DIAGNOSIS — Z96652 Presence of left artificial knee joint: Secondary | ICD-10-CM | POA: Diagnosis present

## 2014-08-27 DIAGNOSIS — E1122 Type 2 diabetes mellitus with diabetic chronic kidney disease: Secondary | ICD-10-CM | POA: Diagnosis present

## 2014-08-27 DIAGNOSIS — I129 Hypertensive chronic kidney disease with stage 1 through stage 4 chronic kidney disease, or unspecified chronic kidney disease: Secondary | ICD-10-CM | POA: Diagnosis present

## 2014-08-27 DIAGNOSIS — Z888 Allergy status to other drugs, medicaments and biological substances status: Secondary | ICD-10-CM

## 2014-08-27 DIAGNOSIS — N189 Chronic kidney disease, unspecified: Secondary | ICD-10-CM | POA: Diagnosis present

## 2014-08-27 DIAGNOSIS — M549 Dorsalgia, unspecified: Secondary | ICD-10-CM | POA: Diagnosis present

## 2014-08-27 DIAGNOSIS — Z6835 Body mass index (BMI) 35.0-35.9, adult: Secondary | ICD-10-CM

## 2014-08-27 DIAGNOSIS — F329 Major depressive disorder, single episode, unspecified: Secondary | ICD-10-CM | POA: Diagnosis present

## 2014-08-27 DIAGNOSIS — J441 Chronic obstructive pulmonary disease with (acute) exacerbation: Secondary | ICD-10-CM | POA: Diagnosis not present

## 2014-08-27 DIAGNOSIS — Z794 Long term (current) use of insulin: Secondary | ICD-10-CM

## 2014-08-27 DIAGNOSIS — E119 Type 2 diabetes mellitus without complications: Secondary | ICD-10-CM

## 2014-08-27 DIAGNOSIS — K2980 Duodenitis without bleeding: Secondary | ICD-10-CM

## 2014-08-27 DIAGNOSIS — I10 Essential (primary) hypertension: Secondary | ICD-10-CM

## 2014-08-27 DIAGNOSIS — F419 Anxiety disorder, unspecified: Secondary | ICD-10-CM | POA: Diagnosis present

## 2014-08-27 DIAGNOSIS — E663 Overweight: Secondary | ICD-10-CM | POA: Diagnosis present

## 2014-08-27 DIAGNOSIS — R0602 Shortness of breath: Secondary | ICD-10-CM | POA: Diagnosis not present

## 2014-08-27 LAB — BASIC METABOLIC PANEL
ANION GAP: 15 (ref 5–15)
BUN: 22 mg/dL (ref 6–23)
CALCIUM: 9.1 mg/dL (ref 8.4–10.5)
CO2: 21 mEq/L (ref 19–32)
Chloride: 100 mEq/L (ref 96–112)
Creatinine, Ser: 1.45 mg/dL — ABNORMAL HIGH (ref 0.50–1.35)
GFR calc Af Amer: 57 mL/min — ABNORMAL LOW (ref >90)
GFR calc non Af Amer: 49 mL/min — ABNORMAL LOW (ref >90)
GLUCOSE: 410 mg/dL — AB (ref 70–99)
Potassium: 4.8 mEq/L (ref 3.7–5.3)
SODIUM: 136 meq/L — AB (ref 137–147)

## 2014-08-27 LAB — CBC WITH DIFFERENTIAL/PLATELET
BASOS ABS: 0 10*3/uL (ref 0.0–0.1)
Basophils Relative: 0 % (ref 0–1)
EOS ABS: 0 10*3/uL (ref 0.0–0.7)
EOS PCT: 0 % (ref 0–5)
HCT: 43.1 % (ref 39.0–52.0)
Hemoglobin: 14.2 g/dL (ref 13.0–17.0)
Lymphocytes Relative: 9 % — ABNORMAL LOW (ref 12–46)
Lymphs Abs: 1 10*3/uL (ref 0.7–4.0)
MCH: 31.6 pg (ref 26.0–34.0)
MCHC: 32.9 g/dL (ref 30.0–36.0)
MCV: 96 fL (ref 78.0–100.0)
Monocytes Absolute: 0.2 10*3/uL (ref 0.1–1.0)
Monocytes Relative: 2 % — ABNORMAL LOW (ref 3–12)
Neutro Abs: 9.7 10*3/uL — ABNORMAL HIGH (ref 1.7–7.7)
Neutrophils Relative %: 89 % — ABNORMAL HIGH (ref 43–77)
Platelets: 324 10*3/uL (ref 150–400)
RBC: 4.49 MIL/uL (ref 4.22–5.81)
RDW: 13.1 % (ref 11.5–15.5)
WBC: 10.9 10*3/uL — ABNORMAL HIGH (ref 4.0–10.5)

## 2014-08-27 LAB — BLOOD GAS, ARTERIAL
ACID-BASE DEFICIT: 3.3 mmol/L — AB (ref 0.0–2.0)
Bicarbonate: 20.9 mEq/L (ref 20.0–24.0)
DRAWN BY: 11249
O2 CONTENT: 8 L/min
O2 Saturation: 96.6 %
PO2 ART: 155 mmHg — AB (ref 80.0–100.0)
Patient temperature: 98.3
TCO2: 18.4 mmol/L (ref 0–100)
pCO2 arterial: 36.4 mmHg (ref 35.0–45.0)
pH, Arterial: 7.376 (ref 7.350–7.450)

## 2014-08-27 MED ORDER — ALBUTEROL SULFATE (2.5 MG/3ML) 0.083% IN NEBU
5.0000 mg | INHALATION_SOLUTION | Freq: Once | RESPIRATORY_TRACT | Status: AC
  Administered 2014-08-27: 5 mg via RESPIRATORY_TRACT
  Filled 2014-08-27: qty 6

## 2014-08-27 MED ORDER — ALBUTEROL (5 MG/ML) CONTINUOUS INHALATION SOLN
10.0000 mg | INHALATION_SOLUTION | RESPIRATORY_TRACT | Status: AC
  Administered 2014-08-27: 10 mg via RESPIRATORY_TRACT
  Filled 2014-08-27: qty 20

## 2014-08-27 MED ORDER — METHYLPREDNISOLONE SODIUM SUCC 125 MG IJ SOLR
125.0000 mg | Freq: Once | INTRAMUSCULAR | Status: AC
  Administered 2014-08-27: 125 mg via INTRAVENOUS
  Filled 2014-08-27: qty 2

## 2014-08-27 NOTE — ED Provider Notes (Signed)
Patient presented to the ER with shortness of breath. Patient reports a previous history of COPD. Patient was seen yesterday with similar symptoms. He was initiated on prednisone. Patient reports that he was doing well earlier today, but then had sudden onset of difficulty breathing  Face to face Exam: HEENT - PERRLA Lungs - good air movement bilaterally, diffuse wheezing without rales or rhonchi Heart - RRR, no M/R/G Abd - S/NT/ND Neuro - alert, oriented x3  Plan: Initiate additional steroid and continuous nebulizer treatment. Recheck room air sats and sats after ambulation.   Orpah Greek, MD 08/27/14 2242

## 2014-08-27 NOTE — ED Notes (Signed)
Pt is requesting orange juice. Blood sugar was 410. Provided patient water due to blood sugar.

## 2014-08-27 NOTE — ED Notes (Signed)
Placed patient on 2L of oxygen via [place] due to O2 sats 90%.

## 2014-08-27 NOTE — ED Notes (Signed)
Notified Amy, RT of ABG order and neb treatments.

## 2014-08-27 NOTE — ED Notes (Addendum)
Pt states he has felt shortness of breath since 330PM today. Pt states he started having difficulty breathing ~30 minutes after taking prednisone. Pt was treated for same yesterday, states today's episode feels milder than yesterday.

## 2014-08-27 NOTE — H&P (Addendum)
Joseph Manning is an 63 y.o. male.   Chief Complaint: sob HPI: 63 yo male who has been feeling bad for three days.  He has had increasing sob.  By yesterday he was very sob.  He was treated in the ER 24 hrs ago and released.  However, he returns for more SOB.  He is coughing.  A little phlegm , white is produced. Sometimes the mucous is thick, other times it is runny.  No fever.  No nausea or vomiting. Oral intake has been normal.   He denies blood from above or below.  In the ER he is found to have a significant copd exacerbation and will need admission  Past Medical History  Diagnosis Date  . Diabetes mellitus without complication   . Hypertension   . Depression   . COPD (chronic obstructive pulmonary disease)   . Tubular adenoma of colon 12/1999  . Anxiety     Past Surgical History  Procedure Laterality Date  . Joint replacement Left   . Knee arthroscopy Left   . Cervical disc surgery    . Esophagogastroduodenoscopy N/A 12/28/2012    Procedure: ESOPHAGOGASTRODUODENOSCOPY (EGD);  Surgeon: Milus Banister, MD;  Location: [facility];  Service: Endoscopy;  Laterality: N/A;    Family History  Problem Relation Age of Onset  . Alzheimer's disease Mother   . Cancer Father     unknown type   Social History:  reports that he has been smoking Cigarettes.  He has a 50 pack-year smoking history. He has never used smokeless tobacco. He reports that he does not drink alcohol or use illicit drugs.  Allergies:  Allergies  Allergen Reactions  . Fentanyl     unknown  . Versed [Midazolam]     unknown    Home meds: Valium  10 mg  Tid Hydrocodone 5/325  He takes it tid  2 in a.m. , one at lunch and two at night. Lisinopril  10 mg daily Insulin 70/30  He does 40 in a.m. And 45 u in p.m. protonix 40 mg bid Goody's headache pills occas. Prednisone 20 mg given yesterday invokana 100 mg at lunch  Results for orders placed during the hospital encounter of 08/27/14 (from the past 48 hour(s))   CBC WITH DIFFERENTIAL     Status: Abnormal   Collection Time    08/27/14  9:55 PM      Result Value Ref Range   WBC 10.9 (*) 4.0 - 10.5 K/uL   RBC 4.49  4.22 - 5.81 MIL/uL   Hemoglobin 14.2  13.0 - 17.0 g/dL   HCT 43.1  39.0 - 52.0 %   MCV 96.0  78.0 - 100.0 fL   MCH 31.6  26.0 - 34.0 pg   MCHC 32.9  30.0 - 36.0 g/dL   RDW 13.1  11.5 - 15.5 %   Platelets 324  150 - 400 K/uL   Neutrophils Relative % 89 (*) 43 - 77 %   Neutro Abs 9.7 (*) 1.7 - 7.7 K/uL   Lymphocytes Relative 9 (*) 12 - 46 %   Lymphs Abs 1.0  0.7 - 4.0 K/uL   Monocytes Relative 2 (*) 3 - 12 %   Monocytes Absolute 0.2  0.1 - 1.0 K/uL   Eosinophils Relative 0  0 - 5 %   Eosinophils Absolute 0.0  0.0 - 0.7 K/uL   Basophils Relative 0  0 - 1 %   Basophils Absolute 0.0  0.0 - 0.1 K/uL  BASIC METABOLIC PANEL     Status: Abnormal   Collection Time    08/27/14  9:55 PM      Result Value Ref Range   Sodium 136 (*) 137 - 147 mEq/L   Potassium 4.8  3.7 - 5.3 mEq/L   Chloride 100  96 - 112 mEq/L   CO2 21  19 - 32 mEq/L   Glucose, Bld 410 (*) 70 - 99 mg/dL   BUN 22  6 - 23 mg/dL   Creatinine, Ser 1.45 (*) 0.50 - 1.35 mg/dL   Calcium 9.1  8.4 - 10.5 mg/dL   GFR calc non Af Amer 49 (*) >90 mL/min   GFR calc Af Amer 57 (*) >90 mL/min   Comment: (NOTE)     The eGFR has been calculated using the CKD EPI equation.     This calculation has not been validated in all clinical situations.     eGFR's persistently <90 mL/min signify possible Chronic Kidney     Disease.   Anion gap 15  5 - 15  BLOOD GAS, ARTERIAL     Status: Abnormal   Collection Time    08/27/14 10:12 PM      Result Value Ref Range   O2 Content 8.0     Delivery systems AEROSOL MASK     pH, Arterial 7.376  7.350 - 7.450   pCO2 arterial 36.4  35.0 - 45.0 mmHg   pO2, Arterial 155.0 (*) 80.0 - 100.0 mmHg   Bicarbonate 20.9  20.0 - 24.0 mEq/L   TCO2 18.4  0 - 100 mmol/L   Acid-base deficit 3.3 (*) 0.0 - 2.0 mmol/L   O2 Saturation 96.6     Patient temperature  98.3     Collection site RIGHT RADIAL     Drawn by [phone]     Sample type ARTERIAL DRAW     Allens test (pass/fail) PASS  PASS   Dg Chest 2 View  08/26/2014   CLINICAL DATA:  Shortness of breath this morning.  Smoker.  EXAM: CHEST  2 VIEW  COMPARISON:  12/26/2012 and 12/25/2012  FINDINGS: Lungs are adequately inflated without consolidation or effusion. Cardiomediastinal silhouette and remainder the exam is unchanged.  IMPRESSION: No active cardiopulmonary disease.   Electronically Signed   By: Marin Olp M.D.   On: 08/26/2014 09:16    ROS:as per hpi  Blood pressure 146/56, pulse 123, temperature 98.3 F (36.8 C), temperature source Oral, resp. rate 26, height '5\' 8"'  (1.727 m), weight 107.502 kg (237 lb), SpO2 95.00%.  age appropriate man, smells of cigarette smoke. hard of hearing.  alert and oriented times 4.  nad but work of breathing is increased.  He has very decreased air movement bilaterally on examination.  heart is tachy with distant s1 and s2, no m/r/g. abd soft, nt, nd, no mass or hsm. no sig edema. moe times 4  Assessment/Plan 63 yo man with significant Acute exacerbation of COPD. We will admit to tele bed.  We will treat with oxygen, solumedrol, nebulizer treatments and azithromycin.  His sugars may spike up and this will be followed while in house.  Continue other home medications.   Full code status.    Jerlyn Ly, MD 08/27/2014, 11:48 PM

## 2014-08-27 NOTE — ED Notes (Signed)
Ambulated patient without oxygen in the hallway. Pt's O2 sats dropped to 88%. Also, patient developed shortness of breath. Vicente Males, RN notified provider.

## 2014-08-28 ENCOUNTER — Encounter (HOSPITAL_COMMUNITY): Payer: Self-pay | Admitting: *Deleted

## 2014-08-28 LAB — CBC WITH DIFFERENTIAL/PLATELET
Basophils Absolute: 0 10*3/uL (ref 0.0–0.1)
Basophils Relative: 0 % (ref 0–1)
Eosinophils Absolute: 0 10*3/uL (ref 0.0–0.7)
Eosinophils Relative: 0 % (ref 0–5)
HCT: 40.2 % (ref 39.0–52.0)
Hemoglobin: 13.1 g/dL (ref 13.0–17.0)
LYMPHS ABS: 0.6 10*3/uL — AB (ref 0.7–4.0)
Lymphocytes Relative: 5 % — ABNORMAL LOW (ref 12–46)
MCH: 31.8 pg (ref 26.0–34.0)
MCHC: 32.6 g/dL (ref 30.0–36.0)
MCV: 97.6 fL (ref 78.0–100.0)
Monocytes Absolute: 0.1 10*3/uL (ref 0.1–1.0)
Monocytes Relative: 1 % — ABNORMAL LOW (ref 3–12)
Neutro Abs: 11.6 10*3/uL — ABNORMAL HIGH (ref 1.7–7.7)
Neutrophils Relative %: 94 % — ABNORMAL HIGH (ref 43–77)
PLATELETS: 324 10*3/uL (ref 150–400)
RBC: 4.12 MIL/uL — AB (ref 4.22–5.81)
RDW: 13.3 % (ref 11.5–15.5)
WBC: 12.3 10*3/uL — ABNORMAL HIGH (ref 4.0–10.5)

## 2014-08-28 LAB — COMPREHENSIVE METABOLIC PANEL
ALT: 12 U/L (ref 0–53)
AST: 13 U/L (ref 0–37)
Albumin: 3.6 g/dL (ref 3.5–5.2)
Alkaline Phosphatase: 59 U/L (ref 39–117)
Anion gap: 19 — ABNORMAL HIGH (ref 5–15)
BUN: 25 mg/dL — ABNORMAL HIGH (ref 6–23)
CHLORIDE: 100 meq/L (ref 96–112)
CO2: 20 meq/L (ref 19–32)
Calcium: 9.1 mg/dL (ref 8.4–10.5)
Creatinine, Ser: 1.28 mg/dL (ref 0.50–1.35)
GFR calc Af Amer: 67 mL/min — ABNORMAL LOW (ref >90)
GFR, EST NON AFRICAN AMERICAN: 58 mL/min — AB (ref >90)
Glucose, Bld: 374 mg/dL — ABNORMAL HIGH (ref 70–99)
POTASSIUM: 4.3 meq/L (ref 3.7–5.3)
SODIUM: 139 meq/L (ref 137–147)
Total Protein: 6.9 g/dL (ref 6.0–8.3)

## 2014-08-28 LAB — GLUCOSE, CAPILLARY
GLUCOSE-CAPILLARY: 277 mg/dL — AB (ref 70–99)
Glucose-Capillary: 396 mg/dL — ABNORMAL HIGH (ref 70–99)
Glucose-Capillary: 286 mg/dL — ABNORMAL HIGH (ref 70–99)
Glucose-Capillary: 327 mg/dL — ABNORMAL HIGH (ref 70–99)
Glucose-Capillary: 337 mg/dL — ABNORMAL HIGH (ref 70–99)

## 2014-08-28 LAB — HEMOGLOBIN A1C
HEMOGLOBIN A1C: 8.9 % — AB (ref <5.7)
Mean Plasma Glucose: 209 mg/dL — ABNORMAL HIGH (ref <117)

## 2014-08-28 MED ORDER — INSULIN ASPART 100 UNIT/ML ~~LOC~~ SOLN
3.0000 [IU] | Freq: Three times a day (TID) | SUBCUTANEOUS | Status: DC
  Administered 2014-08-28 – 2014-08-29 (×4): 3 [IU] via SUBCUTANEOUS

## 2014-08-28 MED ORDER — INSULIN GLARGINE 100 UNIT/ML ~~LOC~~ SOLN
50.0000 [IU] | Freq: Every day | SUBCUTANEOUS | Status: DC
  Administered 2014-08-28: 50 [IU] via SUBCUTANEOUS
  Filled 2014-08-28: qty 0.5

## 2014-08-28 MED ORDER — INSULIN GLARGINE 100 UNIT/ML ~~LOC~~ SOLN
40.0000 [IU] | Freq: Two times a day (BID) | SUBCUTANEOUS | Status: DC
  Administered 2014-08-28 (×2): 40 [IU] via SUBCUTANEOUS
  Filled 2014-08-28 (×3): qty 0.4

## 2014-08-28 MED ORDER — PANTOPRAZOLE SODIUM 40 MG PO TBEC
40.0000 mg | DELAYED_RELEASE_TABLET | Freq: Two times a day (BID) | ORAL | Status: DC
  Administered 2014-08-28 (×3): 40 mg via ORAL
  Filled 2014-08-28 (×4): qty 1

## 2014-08-28 MED ORDER — NICOTINE 21 MG/24HR TD PT24
21.0000 mg | MEDICATED_PATCH | Freq: Every day | TRANSDERMAL | Status: DC
  Filled 2014-08-28 (×2): qty 1

## 2014-08-28 MED ORDER — INSULIN ASPART 100 UNIT/ML ~~LOC~~ SOLN
0.0000 [IU] | Freq: Every day | SUBCUTANEOUS | Status: DC
  Administered 2014-08-28 (×2): 5 [IU] via SUBCUTANEOUS

## 2014-08-28 MED ORDER — ENOXAPARIN SODIUM 40 MG/0.4ML ~~LOC~~ SOLN
40.0000 mg | SUBCUTANEOUS | Status: DC
Start: 2014-08-28 — End: 2014-08-29
  Administered 2014-08-28: 40 mg via SUBCUTANEOUS
  Filled 2014-08-28 (×2): qty 0.4

## 2014-08-28 MED ORDER — LISINOPRIL 10 MG PO TABS
10.0000 mg | ORAL_TABLET | Freq: Every day | ORAL | Status: DC
  Administered 2014-08-28: 10 mg via ORAL
  Filled 2014-08-28 (×2): qty 1

## 2014-08-28 MED ORDER — ACETAMINOPHEN 650 MG RE SUPP: 650.0000 mg | Freq: Four times a day (QID) | RECTAL | Status: DC | PRN

## 2014-08-28 MED ORDER — IPRATROPIUM BROMIDE 0.02 % IN SOLN: 0.5000 mg | RESPIRATORY_TRACT | Status: DC | PRN

## 2014-08-28 MED ORDER — METHYLPREDNISOLONE SODIUM SUCC 125 MG IJ SOLR
125.0000 mg | Freq: Three times a day (TID) | INTRAMUSCULAR | Status: DC
  Administered 2014-08-28 – 2014-08-29 (×4): 125 mg via INTRAVENOUS
  Filled 2014-08-28 (×5): qty 2

## 2014-08-28 MED ORDER — ALBUTEROL SULFATE (2.5 MG/3ML) 0.083% IN NEBU: 2.5000 mg | INHALATION_SOLUTION | RESPIRATORY_TRACT | Status: DC | PRN

## 2014-08-28 MED ORDER — DIAZEPAM 5 MG PO TABS
10.0000 mg | ORAL_TABLET | Freq: Three times a day (TID) | ORAL | Status: DC | PRN
Start: 2014-08-28 — End: 2014-08-29
  Administered 2014-08-28 – 2014-08-29 (×3): 10 mg via ORAL
  Filled 2014-08-28 (×3): qty 2

## 2014-08-28 MED ORDER — DOCUSATE SODIUM 100 MG PO CAPS
100.0000 mg | ORAL_CAPSULE | Freq: Two times a day (BID) | ORAL | Status: DC
  Administered 2014-08-28: 100 mg via ORAL
  Filled 2014-08-28 (×4): qty 1

## 2014-08-28 MED ORDER — ONDANSETRON HCL 4 MG/2ML IJ SOLN: 4.0000 mg | Freq: Four times a day (QID) | INTRAMUSCULAR | Status: DC | PRN

## 2014-08-28 MED ORDER — ACETAMINOPHEN 325 MG PO TABS: 650.0000 mg | ORAL_TABLET | Freq: Four times a day (QID) | ORAL | Status: DC | PRN

## 2014-08-28 MED ORDER — AZITHROMYCIN 500 MG PO TABS
500.0000 mg | ORAL_TABLET | Freq: Every day | ORAL | Status: DC
  Administered 2014-08-28: 500 mg via ORAL
  Filled 2014-08-28 (×2): qty 1

## 2014-08-28 MED ORDER — TIOTROPIUM BROMIDE MONOHYDRATE 18 MCG IN CAPS
18.0000 ug | ORAL_CAPSULE | Freq: Every day | RESPIRATORY_TRACT | Status: DC
  Administered 2014-08-28 – 2014-08-29 (×2): 18 ug via RESPIRATORY_TRACT
  Filled 2014-08-28: qty 5

## 2014-08-28 MED ORDER — SODIUM CHLORIDE 0.9 % IV SOLN
INTRAVENOUS | Status: DC
  Administered 2014-08-28: 01:00:00 via INTRAVENOUS

## 2014-08-28 MED ORDER — ONDANSETRON HCL 4 MG PO TABS: 4.0000 mg | ORAL_TABLET | Freq: Four times a day (QID) | ORAL | Status: DC | PRN

## 2014-08-28 MED ORDER — INSULIN ASPART 100 UNIT/ML ~~LOC~~ SOLN
0.0000 [IU] | Freq: Three times a day (TID) | SUBCUTANEOUS | Status: DC
  Administered 2014-08-28 (×2): 8 [IU] via SUBCUTANEOUS
  Administered 2014-08-28: 11 [IU] via SUBCUTANEOUS
  Administered 2014-08-29: 8 [IU] via SUBCUTANEOUS

## 2014-08-28 MED ORDER — SACCHAROMYCES BOULARDII 250 MG PO CAPS
250.0000 mg | ORAL_CAPSULE | Freq: Two times a day (BID) | ORAL | Status: DC
  Administered 2014-08-28 (×3): 250 mg via ORAL
  Filled 2014-08-28 (×4): qty 1

## 2014-08-28 MED ORDER — HYDROCODONE-ACETAMINOPHEN 5-325 MG PO TABS
1.0000 | ORAL_TABLET | ORAL | Status: DC | PRN
  Administered 2014-08-28 – 2014-08-29 (×2): 2 via ORAL
  Filled 2014-08-28 (×2): qty 2

## 2014-08-28 NOTE — Progress Notes (Signed)
Subjective: His breathing is slightly better than yesterday, but still wheezing, and he slept poorly. He would prefer a regular diet.  Objective: Vital signs in last 24 hours: Temp:  [97.8 F (36.6 C)-98.4 F (36.9 C)] 98.4 F (36.9 C) (10/19 0442) Pulse Rate:  [98-123] 98 (10/19 0442) Resp:  [20-26] 20 (10/19 0442) BP: (127-148)/(52-76) 127/52 mmHg (10/19 0442) SpO2:  [92 %-96 %] 96 % (10/19 0442) Weight:  [105.9 kg (233 lb 7.5 oz)-107.502 kg (237 lb)] 105.9 kg (233 lb 7.5 oz) (10/19 0442) Weight change:    Intake/Output from previous day: 10/18 0701 - 10/19 0700 In: 60 [I.V.:60] Out: -    General appearance: alert, cooperative and no distress Resp: And scattered wheezing in both lungs and no use of accessory muscles of respiration Cardio: regular rate and rhythm, S1, S2 normal, no murmur, click, rub or gallop Extremities: extremities normal, atraumatic, no cyanosis or edema  Lab Results:  Recent Labs  08/27/14 2155 08/28/14 0420  WBC 10.9* 12.3*  HGB 14.2 13.1  HCT 43.1 40.2  PLT 324 324   BMET  Recent Labs  08/27/14 2155 08/28/14 0420  NA 136* 139  K 4.8 4.3  CL 100 100  CO2 21 20  GLUCOSE 410* 374*  BUN 22 25*  CREATININE 1.45* 1.28  CALCIUM 9.1 9.1   CMET CMP     Component Value Date/Time   NA 139 08/28/2014 0420   K 4.3 08/28/2014 0420   CL 100 08/28/2014 0420   CO2 20 08/28/2014 0420   GLUCOSE 374* 08/28/2014 0420   BUN 25* 08/28/2014 0420   CREATININE 1.28 08/28/2014 0420   CALCIUM 9.1 08/28/2014 0420   PROT 6.9 08/28/2014 0420   ALBUMIN 3.6 08/28/2014 0420   AST 13 08/28/2014 0420   ALT 12 08/28/2014 0420   ALKPHOS 59 08/28/2014 0420   BILITOT <0.2* 08/28/2014 0420   GFRNONAA 58* 08/28/2014 0420   GFRAA 67* 08/28/2014 0420    CBG (last 3)   Recent Labs  08/28/14 0210  GLUCAP 396*    INR RESULTS:   Lab Results  Component Value Date   INR 0.96 12/26/2012     Studies/Results: Dg Chest 2 View  08/26/2014   CLINICAL  DATA:  Shortness of breath this morning.  Smoker.  EXAM: CHEST  2 VIEW  COMPARISON:  12/26/2012 and 12/25/2012  FINDINGS: Lungs are adequately inflated without consolidation or effusion. Cardiomediastinal silhouette and remainder the exam is unchanged.  IMPRESSION: No active cardiopulmonary disease.   Electronically Signed   By: Marin Olp M.D.   On: 08/26/2014 09:16    Medications: I have reviewed the patient's current medications.  Assessment/Plan: #1 Dyspnea: His symptoms are from an exacerbation of COPD and improving with IV corticosteroids. We'll also add Spiriva 18 mcg inhalation once daily to his regimen. A nicotine patch will also be added. #2 Diabetes Mellitus, Type II: Blood sugars are quite high so we will increase his insulin dosing.  LOS: 1 day   Mansi Tokar G 08/28/2014, 7:48 AM

## 2014-08-28 NOTE — ED Notes (Signed)
Attempted to call report, primary nurse is currently on phone and will call back.

## 2014-08-28 NOTE — Care Management Note (Addendum)
    Page 1 of 1   08/29/2014     12:41:26 PM CARE MANAGEMENT NOTE 08/29/2014  Patient:  Joseph Manning, Joseph Manning   Account Number:  [id number]  Date Initiated:  08/28/2014  Documentation initiated by:  Dessa Phi  Subjective/Objective Assessment:   63 Y/O M ADMITTED W/COPD.FT:DDUK.     Action/Plan:   FROM  HOME.   Anticipated DC Date:  08/29/2014   Anticipated DC Plan:  [place]  CM consult      Choice offered to / List presented to:  C-1 Patient        [place] arranged  HH-1 RN      [place].   Status of service:  Completed, signed off Medicare Important Message given?   (If response is "NO", the following Medicare IM given date fields will be blank) Date Medicare IM given:   Medicare IM given by:   Date Additional Medicare IM given:   Additional Medicare IM given by:    Discharge Disposition:  [place]  Per UR Regulation:  Reviewed for med. necessity/level of care/duration of stay  If discussed at Long Length of Stay Meetings, dates discussed:    Comments:  08/29/14 Momoko Slezak RN,BSN NCM [phone] NOTED D/C HOME W/HHRN AFTER PATIENT HAD ALREADY D/C.TC PATIENT @ HOME [phone],PATIENT WAS UNAWARE THAT HHC WAS ORDERED.[facility] CHOSEN.TC KRISTEN REP AWARE OF D/C, & HHRN ORDER.UPON F/U-NURSE WAS UNAWARE OF HH ORDER,PATIENT WAS UNAWARE,CM NOTICED HHRN ORDER UPON REFRESH OF SCREEN,IMMEDIATELY TC TO NURSE EMILY.I HAVE LEFT VM W/DR. DANIEL PATTERSON TEL# AS A FRIENDLY REMINDER TO INFORM NSG OF D/C PLANS.  08/28/14 Signe Tackitt RN,BSN NCM [phone] NO ANTICIPATED D/C NEEDS.

## 2014-08-29 LAB — COMPREHENSIVE METABOLIC PANEL
ALBUMIN: 3.4 g/dL — AB (ref 3.5–5.2)
ALT: 12 U/L (ref 0–53)
AST: 9 U/L (ref 0–37)
Alkaline Phosphatase: 56 U/L (ref 39–117)
Anion gap: 13 (ref 5–15)
BILIRUBIN TOTAL: 0.2 mg/dL — AB (ref 0.3–1.2)
BUN: 27 mg/dL — ABNORMAL HIGH (ref 6–23)
CHLORIDE: 102 meq/L (ref 96–112)
CO2: 25 meq/L (ref 19–32)
Calcium: 9.1 mg/dL (ref 8.4–10.5)
Creatinine, Ser: 0.92 mg/dL (ref 0.50–1.35)
GFR calc Af Amer: >90 mL/min (ref >90)
GFR, EST NON AFRICAN AMERICAN: 87 mL/min — AB (ref >90)
Glucose, Bld: 291 mg/dL — ABNORMAL HIGH (ref 70–99)
Potassium: 4.8 mEq/L (ref 3.7–5.3)
Sodium: 140 mEq/L (ref 137–147)
Total Protein: 6.5 g/dL (ref 6.0–8.3)

## 2014-08-29 LAB — CBC WITH DIFFERENTIAL/PLATELET
BASOS ABS: 0 10*3/uL (ref 0.0–0.1)
BASOS PCT: 0 % (ref 0–1)
EOS ABS: 0 10*3/uL (ref 0.0–0.7)
EOS PCT: 0 % (ref 0–5)
HCT: 40.2 % (ref 39.0–52.0)
Hemoglobin: 13.5 g/dL (ref 13.0–17.0)
LYMPHS PCT: 6 % — AB (ref 12–46)
Lymphs Abs: 1 10*3/uL (ref 0.7–4.0)
MCH: 31.6 pg (ref 26.0–34.0)
MCHC: 33.6 g/dL (ref 30.0–36.0)
MCV: 94.1 fL (ref 78.0–100.0)
MONO ABS: 0.6 10*3/uL (ref 0.1–1.0)
Monocytes Relative: 3 % (ref 3–12)
Neutro Abs: 15.3 10*3/uL — ABNORMAL HIGH (ref 1.7–7.7)
Neutrophils Relative %: 91 % — ABNORMAL HIGH (ref 43–77)
PLATELETS: 323 10*3/uL (ref 150–400)
RBC: 4.27 MIL/uL (ref 4.22–5.81)
RDW: 12.7 % (ref 11.5–15.5)
WBC: 16.9 10*3/uL — ABNORMAL HIGH (ref 4.0–10.5)

## 2014-08-29 LAB — GLUCOSE, CAPILLARY: GLUCOSE-CAPILLARY: 299 mg/dL — AB (ref 70–99)

## 2014-08-29 MED ORDER — ALBUTEROL SULFATE HFA 108 (90 BASE) MCG/ACT IN AERS: 2.0000 | INHALATION_SPRAY | Freq: Four times a day (QID) | RESPIRATORY_TRACT | Status: DC | PRN

## 2014-08-29 MED ORDER — PREDNISONE 20 MG PO TABS: 20.0000 mg | ORAL_TABLET | Freq: Every day | ORAL | Status: DC

## 2014-08-29 MED ORDER — NICOTINE 21 MG/24HR TD PT24: 21.0000 mg | MEDICATED_PATCH | Freq: Every day | TRANSDERMAL | Status: DC

## 2014-08-29 MED ORDER — INSULIN NPH ISOPHANE & REGULAR (70-30) 100 UNIT/ML ~~LOC~~ SUSP: SUBCUTANEOUS | Status: DC

## 2014-08-29 MED ORDER — TIOTROPIUM BROMIDE MONOHYDRATE 18 MCG IN CAPS
18.0000 ug | ORAL_CAPSULE | Freq: Every day | RESPIRATORY_TRACT | Status: DC
Start: 2014-08-29 — End: 2015-01-03

## 2014-08-29 NOTE — Discharge Summary (Signed)
Physician Discharge Summary  Patient ID: Joseph Manning MRN: [id number] DOB/AGE: 16-Aug-1951 63 y.o.  Admit date: 08/27/2014 Discharge date: 08/29/2014   Discharge Diagnoses:  Principal Problem:   COPD exacerbation Active Problems:   Diabetes mellitus, type II   Leukocytosis   Chronic back pain   Discharged Condition: good  Hospital Course: HPI: 63 yo male who has been feeling bad for three days. He has had increasing sob. By yesterday he was very sob. He was treated in the ER 24 hrs ago and released. However, he returns for more SOB. He is coughing. A little phlegm , white is produced. Sometimes the mucous is thick, other times it is runny. No fever. No nausea or vomiting. Oral intake has been normal. He denies blood from above or below. In the ER he is found to have a significant copd exacerbation and will need admission  He was admitted and treated with frequent nebulizers and high dose steroids with a rapid improvement in wheezing and dyspnea. His blood sugars remained in the 300-400 range on steroids. A chest X-ray was normal. There were no procedures or complications during his hospitalization. He was also treated with azithromycin 500 mg daily.    Consults: None  Significant Diagnostic Studies:  No results found.  Labs: Lab Results  Component Value Date   WBC 16.9* 08/29/2014   HGB 13.5 08/29/2014   HCT 40.2 08/29/2014   MCV 94.1 08/29/2014   PLT 323 08/29/2014     Recent Labs Lab 08/29/14 0450  NA 140  K 4.8  CL 102  CO2 25  BUN 27*  CREATININE 0.92  CALCIUM 9.1  PROT 6.5  BILITOT 0.2*  ALKPHOS 56  ALT 12  AST 9  GLUCOSE 291*       Lab Results  Component Value Date   INR 0.96 12/26/2012     No results found for this or any previous visit (from the past 240 hour(s)).    Discharge Exam: Blood pressure 112/55, pulse 71, temperature 97.7 F (36.5 C), temperature source Oral, resp. rate 18, height 5\' 8"  (1.727 m), weight 106 kg (233 lb 11 oz), SpO2  96.00%.  Physical Exam: In general he is an overweight white man who was in no apparent distress. He has impaired hearing. HEENT exam was normal, Neck was without JVD, chest was clear to auscultation, heart had a regular rate and rhythm, abdomen was benign, and he had bilateral trace leg edema. He was alert and well oriented with no focal neurological deficits.  Disposition:  He will be discharged to home today. We have requested home health nursing for him to  Improve his understanding of diabetes mellitus and variable insulin dosing.   Discharge Instructions   Call MD for:    Complete by:  As directed   Call for difficulty breathing, chest pain, fever, or other concerning symptoms     Diet - low sodium heart healthy    Complete by:  As directed      Discharge instructions    Complete by:  As directed   Use nicotine patch to help you not restart smoking. If you restart smoking then do not continue the nicotine patches. Check blood sugars twice daily and call us with results every 2 days as sugars will likely be high while you are on prednisone. Drink plenty of fluids because you will likely be urinating more than usual.     Face-to-face encounter (required for Medicare/Medicaid patients)    Complete by:  As directed   I Charels Stambaugh G certify that this patient is under my care and that I, or a nurse practitioner or physician's assistant working with me, had a face-to-face encounter that meets the physician face-to-face encounter requirements with this patient on 08/29/2014. The encounter with the patient was in whole, or in part for the following medical condition(s) which is the primary reason for home health care (List medical condition): He has poorly controlled diabetes mellitus type 2 and dyslexia that makes it difficult for him to follow written instructions, also COPD exacerbation resulting in ER visits x 2 and hospitalization, on prednisone which will elevated blood sugars, needs  diabetes teaching to help him adjust insulin dosing  The encounter with the patient was in whole, or in part, for the following medical condition, which is the primary reason for home health care:  poorly controlled diabetes, COPD exacerbation  I certify that, based on my findings, the following services are medically necessary home health services:  Nursing  My clinical findings support the need for the above services:  Shortness of breath with activity  Further, I certify that my clinical findings support that this patient is homebound due to:  Shortness of Breath with activity  Reason for Medically Necessary Home Health Services:  Skilled Nursing- Changes in Medication/Medication Management     Home Health    Complete by:  As directed   To provide the following care/treatments:  RN     Increase activity slowly    Complete by:  As directed             Medication List         albuterol 108 (90 BASE) MCG/ACT inhaler  Commonly known as:  PROVENTIL HFA;VENTOLIN HFA  Inhale 2 puffs into the lungs every 6 (six) hours as needed for wheezing or shortness of breath.     diazepam 10 MG tablet  Commonly known as:  VALIUM  Take 10 mg by mouth every 8 (eight) hours as needed for anxiety.     GOODY HEADACHE PO  Take 1 packet by mouth as needed (for pain or headache).     insulin NPH-regular Human (70-30) 100 UNIT/ML injection  Commonly known as:  NOVOLIN 70/30  50 units in am and 50 units just before supper every day     lisinopril 10 MG tablet  Commonly known as:  PRINIVIL,ZESTRIL  Take 10 mg by mouth daily.     nicotine 21 mg/24hr patch  Commonly known as:  NICODERM CQ - dosed in mg/24 hours  Place 1 patch (21 mg total) onto the skin daily.     pantoprazole 40 MG tablet  Commonly known as:  PROTONIX  Take 1 tablet (40 mg total) by mouth 2 (two) times daily.     predniSONE 20 MG tablet  Commonly known as:  DELTASONE  Take 1 tablet (20 mg total) by mouth daily with breakfast.      tiotropium 18 MCG inhalation capsule  Commonly known as:  SPIRIVA  Place 1 capsule (18 mcg total) into inhaler and inhale daily.           Follow-up Information   Follow up with [name], MD. Schedule an appointment as soon as possible for a visit in 1 week.   Specialty:  Internal Medicine   Contact information:   [place] [address] [phone]       Signed: [name] 08/29/2014, 9:37 AM

## 2014-08-29 NOTE — Progress Notes (Signed)
Patient received pain medication this am at 0655.  Writer asked patient if he had called his ride to come and pick him up and patient states "No, my ride is in the parking lot outside the emergency room, I drove myself here and I am going to drive myself home."  Writer informs patient that since he received narcotic pain medication, he could not drive himself home, he needed to call someone to pick him up.  At that moment, patient's daughter entered room.  Writer informed patient's daughter that her father could not drive himself home because he received pain medication.  Daughter stated " Dad, you know you can't drive yourself home, I will drive you home in your car."  Writer transports patient in wheelchair to front entrance of emergency room.  Patient's daughter gets car keys from patient and gets patient's car and Probation officer helps patient into passenger seat of car.  Patient states "that is my new blazer I just got, its a '98 model."  Upon leaving entrance, patient in passenger seat of car and daughter driving.

## 2014-09-07 NOTE — ED Provider Notes (Addendum)
CSN: [id number]     Arrival date & time 08/27/14  2006 History   First MD Initiated Contact with Patient 08/27/14 2101     Chief Complaint  Patient presents with  . Shortness of Breath     (Consider location/radiation/quality/duration/timing/severity/associated sxs/prior Treatment) The history is provided by the patient. No language interpreter was used.  patient presents tot the Ed for SOB. He was seen yesterday for similar sxs. He was treated with albuterol and steroids and discharged with oral prednisone. He states that he was doing well up until this evening when he again had progressively worsening SOB unrelieved with home treatments.He denies fevers, cp, productive cough, nausea, hemoptysis, vomiting or diaphoresis.     Past Medical History  Diagnosis Date  . Diabetes mellitus without complication   . Hypertension   . Depression   . COPD (chronic obstructive pulmonary disease)   . Tubular adenoma of colon 12/1999  . Anxiety    Past Surgical History  Procedure Laterality Date  . Joint replacement Left   . Knee arthroscopy Left   . Cervical disc surgery    . Esophagogastroduodenoscopy N/A 12/28/2012    Procedure: ESOPHAGOGASTRODUODENOSCOPY (EGD);  Surgeon: Milus Banister, MD;  Location: [facility];  Service: Endoscopy;  Laterality: N/A;   Family History  Problem Relation Age of Onset  . Alzheimer's disease Mother   . Cancer Father     unknown type   History  Substance Use Topics  . Smoking status: Current Every Day Smoker -- 1.00 packs/day for 50 years    Types: Cigarettes  . Smokeless tobacco: Never Used  . Alcohol Use: No    Review of Systems  Ten systems reviewed and are negative for acute change, except as noted in the HPI.    Allergies  Fentanyl and Versed  Home Medications   Prior to Admission medications   Medication Sig Start Date End Date Taking? Authorizing Provider  Aspirin-Acetaminophen-Caffeine (GOODY HEADACHE PO) Take 1 packet by mouth as  needed (for pain or headache).   Yes Historical Provider, MD  diazepam (VALIUM) 10 MG tablet Take 10 mg by mouth every 8 (eight) hours as needed for anxiety.   Yes Historical Provider, MD  lisinopril (PRINIVIL,ZESTRIL) 10 MG tablet Take 10 mg by mouth daily.   Yes Historical Provider, MD  pantoprazole (PROTONIX) 40 MG tablet Take 1 tablet (40 mg total) by mouth 2 (two) times daily. 02/09/13  Yes Jessica D. Zehr, PA-C  albuterol (PROVENTIL HFA;VENTOLIN HFA) 108 (90 BASE) MCG/ACT inhaler Inhale 2 puffs into the lungs every 6 (six) hours as needed for wheezing or shortness of breath. 08/29/14   [name], MD  insulin NPH-regular Human (NOVOLIN 70/30) (70-30) 100 UNIT/ML injection 50 units in am and 50 units just before supper every day 08/29/14   [name], MD  nicotine (NICODERM CQ - DOSED IN MG/24 HOURS) 21 mg/24hr patch Place 1 patch (21 mg total) onto the skin daily. 08/29/14   [name], MD  predniSONE (DELTASONE) 20 MG tablet Take 1 tablet (20 mg total) by mouth daily with breakfast. 08/29/14   [name], MD  tiotropium (SPIRIVA) 18 MCG inhalation capsule Place 1 capsule (18 mcg total) into inhaler and inhale daily. 08/29/14   [name], MD   BP 112/55  Pulse 71  Temp(Src) 97.7 F (36.5 C) (Oral)  Resp 18  Ht 5\' 8"  (1.727 m)  Wt 233 lb 11 oz (106 kg)  BMI 35.54 kg/m2  SpO2 92% Physical Exam  Constitutional:  He appears well-developed and well-nourished. No distress.  HENT:  Head: Normocephalic and atraumatic.  Eyes: Conjunctivae are normal. No scleral icterus.  Neck: Normal range of motion. Neck supple.  Cardiovascular: Normal rate, regular rhythm and normal heart sounds.   Pulmonary/Chest: He has wheezes.  Abdominal: Soft. There is no tenderness.  Musculoskeletal: He exhibits no edema.  Neurological: He is alert.  Skin: Skin is warm and dry. He is not diaphoretic.  Psychiatric: His behavior is normal.  Nursing note and vitals reviewed.   ED Course   Procedures (including critical care time) Labs Review Labs Reviewed  CBC WITH DIFFERENTIAL - Abnormal; Notable for the following:    WBC 10.9 (*)    Neutrophils Relative % 89 (*)    Neutro Abs 9.7 (*)    Lymphocytes Relative 9 (*)    Monocytes Relative 2 (*)    All other components within normal limits  BASIC METABOLIC PANEL - Abnormal; Notable for the following:    Sodium 136 (*)    Glucose, Bld 410 (*)    Creatinine, Ser 1.45 (*)    GFR calc non Af Amer 49 (*)    GFR calc Af Amer 57 (*)    All other components within normal limits  BLOOD GAS, ARTERIAL - Abnormal; Notable for the following:    pO2, Arterial 155.0 (*)    Acid-base deficit 3.3 (*)    All other components within normal limits  COMPREHENSIVE METABOLIC PANEL - Abnormal; Notable for the following:    Glucose, Bld 374 (*)    BUN 25 (*)    Total Bilirubin <0.2 (*)    GFR calc non Af Amer 58 (*)    GFR calc Af Amer 67 (*)    Anion gap 19 (*)    All other components within normal limits  CBC WITH DIFFERENTIAL - Abnormal; Notable for the following:    WBC 12.3 (*)    RBC 4.12 (*)    Neutrophils Relative % 94 (*)    Neutro Abs 11.6 (*)    Lymphocytes Relative 5 (*)    Lymphs Abs 0.6 (*)    Monocytes Relative 1 (*)    All other components within normal limits  HEMOGLOBIN A1C - Abnormal; Notable for the following:    Hemoglobin A1C 8.9 (*)    Mean Plasma Glucose 209 (*)    All other components within normal limits  GLUCOSE, CAPILLARY - Abnormal; Notable for the following:    Glucose-Capillary 396 (*)    All other components within normal limits  GLUCOSE, CAPILLARY - Abnormal; Notable for the following:    Glucose-Capillary 277 (*)    All other components within normal limits  GLUCOSE, CAPILLARY - Abnormal; Notable for the following:    Glucose-Capillary 286 (*)    All other components within normal limits  COMPREHENSIVE METABOLIC PANEL - Abnormal; Notable for the following:    Glucose, Bld 291 (*)    BUN 27  (*)    Albumin 3.4 (*)    Total Bilirubin 0.2 (*)    GFR calc non Af Amer 87 (*)    All other components within normal limits  CBC WITH DIFFERENTIAL - Abnormal; Notable for the following:    WBC 16.9 (*)    Neutrophils Relative % 91 (*)    Neutro Abs 15.3 (*)    Lymphocytes Relative 6 (*)    All other components within normal limits  GLUCOSE, CAPILLARY - Abnormal; Notable for the following:    Glucose-Capillary  327 (*)    All other components within normal limits  GLUCOSE, CAPILLARY - Abnormal; Notable for the following:    Glucose-Capillary 337 (*)    All other components within normal limits  GLUCOSE, CAPILLARY - Abnormal; Notable for the following:    Glucose-Capillary 299 (*)    All other components within normal limits    Imaging Review No results found.   EKG Interpretation   Date/Time:  Sunday August 27 2014 21:33:15 EDT Ventricular Rate:  113 PR Interval:  140 QRS Duration: 92 QT Interval:  326 QTC Calculation: 447 R Axis:   71 Text Interpretation:  Sinus tachycardia Atrial premature complex Low  voltage, precordial leads No significant change since last tracing  Confirmed by POLLINA  MD, CHRISTOPHER [phone]) on 08/27/2014 10:07:03 PM      MDM   Final diagnoses:  Leukocytosis  Type 2 diabetes mellitus with diabetic chronic kidney disease  Chronic back pain  COPD exacerbation  Duodenitis  Smoker  Essential hypertension  Anxiety    Patient here with Acute COPD exacerbation. Increased work of breathing, speaking in 2-3 word sentences. Patient seen in shared visit with attending physician. CXR from yesterday is without evidence of pneumonia. creatinine appears to be at baseline.  elevated blood glucose at 410. EKG is unchanged.  He is begun on hour long neb. IV solumedrol. I have spoken with Dr. Selena Lesser who will admit the patient.    Margarita Mail, PA-C 09/10/14 [place], PA-C 09/12/14 [place], PA-C 09/12/14  [place], MD 09/14/14 [phone]

## 2015-01-01 ENCOUNTER — Emergency Department (HOSPITAL_COMMUNITY): Payer: Medicaid Other

## 2015-01-01 ENCOUNTER — Inpatient Hospital Stay (HOSPITAL_COMMUNITY)
Admission: EM | Admit: 2015-01-01 | Discharge: 2015-01-03 | DRG: 871 | Disposition: A | Discharge status: Home / Self Care | Payer: Medicaid Other | Attending: Internal Medicine | Admitting: Internal Medicine

## 2015-01-01 ENCOUNTER — Encounter (HOSPITAL_COMMUNITY): Payer: Self-pay

## 2015-01-01 ENCOUNTER — Inpatient Hospital Stay (HOSPITAL_COMMUNITY): Payer: Medicaid Other

## 2015-01-01 DIAGNOSIS — F17210 Nicotine dependence, cigarettes, uncomplicated: Secondary | ICD-10-CM | POA: Diagnosis present

## 2015-01-01 DIAGNOSIS — J441 Chronic obstructive pulmonary disease with (acute) exacerbation: Secondary | ICD-10-CM | POA: Diagnosis present

## 2015-01-01 DIAGNOSIS — J449 Chronic obstructive pulmonary disease, unspecified: Secondary | ICD-10-CM | POA: Diagnosis present

## 2015-01-01 DIAGNOSIS — N179 Acute kidney failure, unspecified: Secondary | ICD-10-CM | POA: Diagnosis present

## 2015-01-01 DIAGNOSIS — F419 Anxiety disorder, unspecified: Secondary | ICD-10-CM | POA: Diagnosis present

## 2015-01-01 DIAGNOSIS — I10 Essential (primary) hypertension: Secondary | ICD-10-CM | POA: Diagnosis present

## 2015-01-01 DIAGNOSIS — Z9889 Other specified postprocedural states: Secondary | ICD-10-CM

## 2015-01-01 DIAGNOSIS — F17200 Nicotine dependence, unspecified, uncomplicated: Secondary | ICD-10-CM | POA: Diagnosis present

## 2015-01-01 DIAGNOSIS — J90 Pleural effusion, not elsewhere classified: Secondary | ICD-10-CM | POA: Insufficient documentation

## 2015-01-01 DIAGNOSIS — E119 Type 2 diabetes mellitus without complications: Secondary | ICD-10-CM

## 2015-01-01 DIAGNOSIS — E872 Acidosis, unspecified: Secondary | ICD-10-CM | POA: Insufficient documentation

## 2015-01-01 DIAGNOSIS — Z888 Allergy status to other drugs, medicaments and biological substances status: Secondary | ICD-10-CM

## 2015-01-01 DIAGNOSIS — R1011 Right upper quadrant pain: Secondary | ICD-10-CM

## 2015-01-01 DIAGNOSIS — A419 Sepsis, unspecified organism: Secondary | ICD-10-CM | POA: Diagnosis present

## 2015-01-01 DIAGNOSIS — K2980 Duodenitis without bleeding: Secondary | ICD-10-CM | POA: Diagnosis present

## 2015-01-01 DIAGNOSIS — J189 Pneumonia, unspecified organism: Secondary | ICD-10-CM | POA: Diagnosis present

## 2015-01-01 DIAGNOSIS — R6521 Severe sepsis with septic shock: Secondary | ICD-10-CM | POA: Diagnosis present

## 2015-01-01 DIAGNOSIS — N289 Disorder of kidney and ureter, unspecified: Secondary | ICD-10-CM

## 2015-01-01 DIAGNOSIS — R0602 Shortness of breath: Secondary | ICD-10-CM | POA: Diagnosis present

## 2015-01-01 DIAGNOSIS — G894 Chronic pain syndrome: Secondary | ICD-10-CM | POA: Diagnosis present

## 2015-01-01 DIAGNOSIS — R6520 Severe sepsis without septic shock: Secondary | ICD-10-CM | POA: Diagnosis present

## 2015-01-01 DIAGNOSIS — Z794 Long term (current) use of insulin: Secondary | ICD-10-CM | POA: Diagnosis not present

## 2015-01-01 DIAGNOSIS — Z7952 Long term (current) use of systemic steroids: Secondary | ICD-10-CM | POA: Diagnosis not present

## 2015-01-01 DIAGNOSIS — I959 Hypotension, unspecified: Secondary | ICD-10-CM

## 2015-01-01 DIAGNOSIS — J869 Pyothorax without fistula: Secondary | ICD-10-CM

## 2015-01-01 DIAGNOSIS — F329 Major depressive disorder, single episode, unspecified: Secondary | ICD-10-CM | POA: Diagnosis present

## 2015-01-01 DIAGNOSIS — E875 Hyperkalemia: Secondary | ICD-10-CM | POA: Diagnosis present

## 2015-01-01 DIAGNOSIS — J948 Other specified pleural conditions: Secondary | ICD-10-CM

## 2015-01-01 HISTORY — DX: Unspecified osteoarthritis, unspecified site: M19.90

## 2015-01-01 LAB — BLOOD GAS, VENOUS
ACID-BASE EXCESS: 0.3 mmol/L (ref 0.0–2.0)
Bicarbonate: 25.3 mEq/L — ABNORMAL HIGH (ref 20.0–24.0)
FIO2: 0.21 %
O2 Saturation: 29.7 %
PATIENT TEMPERATURE: 98.6
PCO2 VEN: 44.5 mmHg — AB (ref 45.0–50.0)
TCO2: 22.9 mmol/L (ref 0–100)
pH, Ven: 7.374 — ABNORMAL HIGH (ref 7.250–7.300)

## 2015-01-01 LAB — URINALYSIS, ROUTINE W REFLEX MICROSCOPIC
BILIRUBIN URINE: NEGATIVE
Bilirubin Urine: NEGATIVE
Glucose, UA: 100 mg/dL — AB
Glucose, UA: >1000 mg/dL — AB
Hgb urine dipstick: NEGATIVE
Hgb urine dipstick: NEGATIVE
KETONES UR: NEGATIVE mg/dL
Ketones, ur: NEGATIVE mg/dL
Leukocytes, UA: NEGATIVE
Leukocytes, UA: NEGATIVE
Nitrite: NEGATIVE
Nitrite: NEGATIVE
PH: 6 (ref 5.0–8.0)
PROTEIN: NEGATIVE mg/dL
PROTEIN: NEGATIVE mg/dL
SPECIFIC GRAVITY, URINE: 1.024 (ref 1.005–1.030)
Specific Gravity, Urine: 1.018 (ref 1.005–1.030)
Urobilinogen, UA: 1 mg/dL (ref 0.0–1.0)
Urobilinogen, UA: 0.2 mg/dL (ref 0.0–1.0)
pH: 5 (ref 5.0–8.0)

## 2015-01-01 LAB — CBC WITH DIFFERENTIAL/PLATELET
Basophils Absolute: 0.2 10*3/uL — ABNORMAL HIGH (ref 0.0–0.1)
Basophils Relative: 1 % (ref 0–1)
Eosinophils Absolute: 0.2 10*3/uL (ref 0.0–0.7)
Eosinophils Relative: 1 % (ref 0–5)
HCT: 45.6 % (ref 39.0–52.0)
HEMOGLOBIN: 15.2 g/dL (ref 13.0–17.0)
LYMPHS PCT: 15 % (ref 12–46)
Lymphs Abs: 3.3 10*3/uL (ref 0.7–4.0)
MCH: 32.1 pg (ref 26.0–34.0)
MCHC: 33.3 g/dL (ref 30.0–36.0)
MCV: 96.2 fL (ref 78.0–100.0)
Monocytes Absolute: 1.3 10*3/uL — ABNORMAL HIGH (ref 0.1–1.0)
Monocytes Relative: 6 % (ref 3–12)
Neutro Abs: 16.9 10*3/uL — ABNORMAL HIGH (ref 1.7–7.7)
Neutrophils Relative %: 77 % (ref 43–77)
PLATELETS: 658 10*3/uL — AB (ref 150–400)
RBC: 4.74 MIL/uL (ref 4.22–5.81)
RDW: 12.9 % (ref 11.5–15.5)
WBC: 21.9 10*3/uL — ABNORMAL HIGH (ref 4.0–10.5)

## 2015-01-01 LAB — URINE MICROSCOPIC-ADD ON

## 2015-01-01 LAB — COMPREHENSIVE METABOLIC PANEL
ALBUMIN: 2.6 g/dL — AB (ref 3.5–5.2)
ALK PHOS: 102 U/L (ref 39–117)
ALT: 25 U/L (ref 0–53)
AST: 25 U/L (ref 0–37)
Anion gap: 13 (ref 5–15)
BUN: 44 mg/dL — ABNORMAL HIGH (ref 6–23)
CALCIUM: 8.6 mg/dL (ref 8.4–10.5)
CHLORIDE: 98 mmol/L (ref 96–112)
CO2: 24 mmol/L (ref 19–32)
Creatinine, Ser: 1.72 mg/dL — ABNORMAL HIGH (ref 0.50–1.35)
GFR calc Af Amer: 47 mL/min — ABNORMAL LOW (ref >90)
GFR, EST NON AFRICAN AMERICAN: 40 mL/min — AB (ref >90)
Glucose, Bld: 204 mg/dL — ABNORMAL HIGH (ref 70–99)
Potassium: 5.3 mmol/L — ABNORMAL HIGH (ref 3.5–5.1)
Sodium: 135 mmol/L (ref 135–145)
Total Bilirubin: 0.5 mg/dL (ref 0.3–1.2)
Total Protein: 7.6 g/dL (ref 6.0–8.3)

## 2015-01-01 LAB — GLUCOSE, CAPILLARY
GLUCOSE-CAPILLARY: 408 mg/dL — AB (ref 70–99)
Glucose-Capillary: 328 mg/dL — ABNORMAL HIGH (ref 70–99)
Glucose-Capillary: 219 mg/dL — ABNORMAL HIGH (ref 70–99)

## 2015-01-01 LAB — I-STAT CHEM 8, ED
BUN: 41 mg/dL — ABNORMAL HIGH (ref 6–23)
CHLORIDE: 101 mmol/L (ref 96–112)
CREATININE: 1.6 mg/dL — AB (ref 0.50–1.35)
Calcium, Ion: 1.02 mmol/L — ABNORMAL LOW (ref 1.13–1.30)
Glucose, Bld: 212 mg/dL — ABNORMAL HIGH (ref 70–99)
HCT: 50 % (ref 39.0–52.0)
Hemoglobin: 17 g/dL (ref 13.0–17.0)
POTASSIUM: 5.3 mmol/L — AB (ref 3.5–5.1)
SODIUM: 134 mmol/L — AB (ref 135–145)
TCO2: 21 mmol/L (ref 0–100)

## 2015-01-01 LAB — LACTATE DEHYDROGENASE, PLEURAL OR PERITONEAL FLUID: LD, Fluid: 485 U/L — ABNORMAL HIGH (ref 3–23)

## 2015-01-01 LAB — BODY FLUID CELL COUNT WITH DIFFERENTIAL
LYMPHS FL: 75 %
Monocyte-Macrophage-Serous Fluid: 19 % — ABNORMAL LOW (ref 50–90)
NEUTROPHIL FLUID: 6 % (ref 0–25)
Total Nucleated Cell Count, Fluid: 1221 cu mm — ABNORMAL HIGH (ref 0–1000)

## 2015-01-01 LAB — AMYLASE: Amylase: 57 U/L (ref 0–105)

## 2015-01-01 LAB — I-STAT CG4 LACTIC ACID, ED
Lactic Acid, Venous: 2.75 mmol/L (ref 0.5–2.0)
Lactic Acid, Venous: 0.87 mmol/L (ref 0.5–2.0)

## 2015-01-01 LAB — ALBUMIN: Albumin: 2.1 g/dL — ABNORMAL LOW (ref 3.5–5.2)

## 2015-01-01 LAB — BRAIN NATRIURETIC PEPTIDE: B Natriuretic Peptide: 74.5 pg/mL (ref 0.0–100.0)

## 2015-01-01 LAB — LIPASE, BLOOD: LIPASE: 40 U/L (ref 11–59)

## 2015-01-01 LAB — GLUCOSE, SEROUS FLUID: GLUCOSE FL: 219 mg/dL

## 2015-01-01 LAB — CREATININE, SERUM
Creatinine, Ser: 1.22 mg/dL (ref 0.50–1.35)
GFR calc Af Amer: 71 mL/min — ABNORMAL LOW (ref >90)
GFR calc non Af Amer: 61 mL/min — ABNORMAL LOW (ref >90)

## 2015-01-01 LAB — LACTATE DEHYDROGENASE: LDH: 163 U/L (ref 94–250)

## 2015-01-01 LAB — I-STAT TROPONIN, ED: Troponin i, poc: 0.02 ng/mL (ref 0.00–0.08)

## 2015-01-01 LAB — MRSA PCR SCREENING: MRSA by PCR: NEGATIVE

## 2015-01-01 LAB — TROPONIN I
Troponin I: <0.03 ng/mL (ref <0.031)
Troponin I: <0.03 ng/mL (ref <0.031)

## 2015-01-01 LAB — PROTEIN, BODY FLUID: Total protein, fluid: 4 g/dL

## 2015-01-01 MED ORDER — SODIUM CHLORIDE 0.9 % IJ SOLN
3.0000 mL | Freq: Two times a day (BID) | INTRAMUSCULAR | Status: DC
  Administered 2015-01-01: 3 mL via INTRAVENOUS

## 2015-01-01 MED ORDER — DEXTROSE 5 % IV SOLN
0.0000 ug/min | Freq: Once | INTRAVENOUS | Status: DC
Start: 2015-01-01 — End: 2015-01-01
  Filled 2015-01-01: qty 4

## 2015-01-01 MED ORDER — ALUM & MAG HYDROXIDE-SIMETH 200-200-20 MG/5ML PO SUSP: 30.0000 mL | Freq: Four times a day (QID) | ORAL | Status: DC | PRN

## 2015-01-01 MED ORDER — POTASSIUM CHLORIDE IN NACL 20-0.9 MEQ/L-% IV SOLN
INTRAVENOUS | Status: DC
  Filled 2015-01-01: qty 1000

## 2015-01-01 MED ORDER — PIPERACILLIN-TAZOBACTAM 3.375 G IVPB 30 MIN
3.3750 g | Freq: Four times a day (QID) | INTRAVENOUS | Status: DC
  Administered 2015-01-01: 3.375 g via INTRAVENOUS
  Filled 2015-01-01: qty 50

## 2015-01-01 MED ORDER — PIPERACILLIN-TAZOBACTAM 3.375 G IVPB
3.3750 g | Freq: Three times a day (TID) | INTRAVENOUS | Status: DC
  Administered 2015-01-01 – 2015-01-03 (×5): 3.375 g via INTRAVENOUS
  Filled 2015-01-01 (×8): qty 50

## 2015-01-01 MED ORDER — DEXTROSE 5 % IV SOLN
500.0000 mg | INTRAVENOUS | Status: DC
  Administered 2015-01-02 – 2015-01-03 (×2): 500 mg via INTRAVENOUS
  Filled 2015-01-01 (×2): qty 500

## 2015-01-01 MED ORDER — PREDNISONE 20 MG PO TABS
40.0000 mg | ORAL_TABLET | Freq: Every day | ORAL | Status: DC
Start: 2015-01-01 — End: 2015-01-03
  Administered 2015-01-01 – 2015-01-03 (×3): 40 mg via ORAL
  Filled 2015-01-01 (×4): qty 2

## 2015-01-01 MED ORDER — INSULIN ASPART 100 UNIT/ML ~~LOC~~ SOLN
0.0000 [IU] | Freq: Every day | SUBCUTANEOUS | Status: DC
Start: 2015-01-01 — End: 2015-01-03
  Administered 2015-01-02: 5 [IU] via SUBCUTANEOUS
  Administered 2015-01-02: 2 [IU] via SUBCUTANEOUS

## 2015-01-01 MED ORDER — ALBUTEROL SULFATE (2.5 MG/3ML) 0.083% IN NEBU
2.5000 mg | INHALATION_SOLUTION | RESPIRATORY_TRACT | Status: DC | PRN
  Administered 2015-01-01: 2.5 mg via RESPIRATORY_TRACT
  Filled 2015-01-01: qty 3

## 2015-01-01 MED ORDER — INSULIN ASPART 100 UNIT/ML ~~LOC~~ SOLN
0.0000 [IU] | Freq: Three times a day (TID) | SUBCUTANEOUS | Status: DC
  Administered 2015-01-01: 7 [IU] via SUBCUTANEOUS
  Administered 2015-01-01: 15 [IU] via SUBCUTANEOUS
  Administered 2015-01-02: 7 [IU] via SUBCUTANEOUS
  Administered 2015-01-02: 20 [IU] via SUBCUTANEOUS
  Administered 2015-01-02: 11 [IU] via SUBCUTANEOUS
  Administered 2015-01-03: 4 [IU] via SUBCUTANEOUS

## 2015-01-01 MED ORDER — ADULT MULTIVITAMIN W/MINERALS CH
1.0000 | ORAL_TABLET | Freq: Every day | ORAL | Status: DC
  Administered 2015-01-01 – 2015-01-02 (×2): 1 via ORAL
  Filled 2015-01-01 (×3): qty 1

## 2015-01-01 MED ORDER — ACETAMINOPHEN 325 MG PO TABS: 650.0000 mg | ORAL_TABLET | Freq: Four times a day (QID) | ORAL | Status: DC | PRN

## 2015-01-01 MED ORDER — SODIUM CHLORIDE 0.9 % IV BOLUS (SEPSIS)
500.0000 mL | INTRAVENOUS | Status: AC
  Administered 2015-01-01: 500 mL via INTRAVENOUS

## 2015-01-01 MED ORDER — INSULIN ASPART 100 UNIT/ML ~~LOC~~ SOLN
4.0000 [IU] | Freq: Three times a day (TID) | SUBCUTANEOUS | Status: DC
  Administered 2015-01-01 – 2015-01-02 (×5): 4 [IU] via SUBCUTANEOUS

## 2015-01-01 MED ORDER — ACETAMINOPHEN 650 MG RE SUPP: 650.0000 mg | Freq: Four times a day (QID) | RECTAL | Status: DC | PRN

## 2015-01-01 MED ORDER — SODIUM CHLORIDE 0.9 % IV SOLN
INTRAVENOUS | Status: DC
  Administered 2015-01-01: 11:00:00 via INTRAVENOUS

## 2015-01-01 MED ORDER — LEVOFLOXACIN IN D5W 750 MG/150ML IV SOLN
750.0000 mg | Freq: Once | INTRAVENOUS | Status: AC
  Administered 2015-01-01: 750 mg via INTRAVENOUS
  Filled 2015-01-01: qty 150

## 2015-01-01 MED ORDER — ONDANSETRON HCL 4 MG PO TABS: 4.0000 mg | ORAL_TABLET | Freq: Four times a day (QID) | ORAL | Status: DC | PRN

## 2015-01-01 MED ORDER — PIPERACILLIN-TAZOBACTAM 3.375 G IVPB
3.3750 g | Freq: Once | INTRAVENOUS | Status: AC
  Administered 2015-01-01: 3.375 g via INTRAVENOUS
  Filled 2015-01-01: qty 50

## 2015-01-01 MED ORDER — PANTOPRAZOLE SODIUM 40 MG IV SOLR
40.0000 mg | INTRAVENOUS | Status: DC
  Administered 2015-01-01: 40 mg via INTRAVENOUS
  Filled 2015-01-01: qty 40

## 2015-01-01 MED ORDER — OXYCODONE HCL 5 MG PO TABS
5.0000 mg | ORAL_TABLET | ORAL | Status: DC | PRN
  Administered 2015-01-01 – 2015-01-03 (×4): 5 mg via ORAL
  Filled 2015-01-01 (×4): qty 1

## 2015-01-01 MED ORDER — ALBUTEROL SULFATE (2.5 MG/3ML) 0.083% IN NEBU: 2.5000 mg | INHALATION_SOLUTION | RESPIRATORY_TRACT | Status: DC | PRN

## 2015-01-01 MED ORDER — ENOXAPARIN SODIUM 40 MG/0.4ML ~~LOC~~ SOLN
40.0000 mg | SUBCUTANEOUS | Status: DC
  Filled 2015-01-01: qty 0.4

## 2015-01-01 MED ORDER — VANCOMYCIN HCL 10 G IV SOLR
1250.0000 mg | INTRAVENOUS | Status: DC
  Administered 2015-01-02: 1250 mg via INTRAVENOUS
  Filled 2015-01-01: qty 1250

## 2015-01-01 MED ORDER — DEXTROSE 5 % IV SOLN: 500.0000 mg | INTRAVENOUS | Status: DC

## 2015-01-01 MED ORDER — VANCOMYCIN HCL 10 G IV SOLR
2000.0000 mg | INTRAVENOUS | Status: AC
  Administered 2015-01-01: 2000 mg via INTRAVENOUS
  Filled 2015-01-01: qty 2000

## 2015-01-01 MED ORDER — PANTOPRAZOLE SODIUM 40 MG IV SOLR
40.0000 mg | Freq: Every day | INTRAVENOUS | Status: DC
  Administered 2015-01-02: 40 mg via INTRAVENOUS
  Filled 2015-01-01 (×2): qty 40

## 2015-01-01 MED ORDER — SODIUM CHLORIDE 0.9 % IV BOLUS (SEPSIS): 1000.0000 mL | Freq: Once | INTRAVENOUS | Status: DC

## 2015-01-01 MED ORDER — IPRATROPIUM-ALBUTEROL 0.5-2.5 (3) MG/3ML IN SOLN
3.0000 mL | RESPIRATORY_TRACT | Status: DC
Start: 2015-01-01 — End: 2015-01-03
  Administered 2015-01-01 – 2015-01-03 (×9): 3 mL via RESPIRATORY_TRACT
  Filled 2015-01-01 (×10): qty 3

## 2015-01-01 MED ORDER — SORBITOL 70 % SOLN
30.0000 mL | Freq: Every day | Status: DC | PRN
  Filled 2015-01-01: qty 30

## 2015-01-01 MED ORDER — CETYLPYRIDINIUM CHLORIDE 0.05 % MT LIQD
7.0000 mL | Freq: Two times a day (BID) | OROMUCOSAL | Status: DC
  Administered 2015-01-01: 7 mL via OROMUCOSAL

## 2015-01-01 MED ORDER — BUDESONIDE 0.25 MG/2ML IN SUSP
0.2500 mg | Freq: Two times a day (BID) | RESPIRATORY_TRACT | Status: DC
  Administered 2015-01-01 – 2015-01-03 (×5): 0.25 mg via RESPIRATORY_TRACT
  Filled 2015-01-01 (×5): qty 2

## 2015-01-01 MED ORDER — ZOLPIDEM TARTRATE 5 MG PO TABS
5.0000 mg | ORAL_TABLET | Freq: Every evening | ORAL | Status: DC | PRN
  Administered 2015-01-01 – 2015-01-02 (×2): 5 mg via ORAL
  Filled 2015-01-01 (×2): qty 1

## 2015-01-01 MED ORDER — GUAIFENESIN ER 600 MG PO TB12
600.0000 mg | ORAL_TABLET | Freq: Two times a day (BID) | ORAL | Status: DC
  Administered 2015-01-01 – 2015-01-02 (×4): 600 mg via ORAL
  Filled 2015-01-01 (×6): qty 1

## 2015-01-01 MED ORDER — ONDANSETRON HCL 4 MG/2ML IJ SOLN: 4.0000 mg | Freq: Four times a day (QID) | INTRAMUSCULAR | Status: DC | PRN

## 2015-01-01 MED ORDER — SODIUM CHLORIDE 0.9 % IV BOLUS (SEPSIS)
1000.0000 mL | INTRAVENOUS | Status: AC
  Administered 2015-01-01 (×3): 1000 mL via INTRAVENOUS

## 2015-01-01 NOTE — ED Notes (Signed)
Patient also with c/o "chest tightness" x 7 days Patient noted to be SOB Patient noted to have hx of SOB, but denies that he has COPD

## 2015-01-01 NOTE — ED Provider Notes (Signed)
CSN: [id number]     Arrival date & time 01/01/15  9292 History   First MD Initiated Contact with Patient 01/01/15 [phone]     Chief Complaint  Patient presents with  . Abdominal Pain  . Emesis    (Consider location/radiation/quality/duration/timing/severity/associated sxs/prior Treatment) HPI Comments: 64 year old male with a history of diabetes mellitus, hypertension, and COPD who presents to the emergency department for further evaluation of right-sided abdominal pain. Patient states that he has been having intermittent pain in his right upper abdomen over the past week. He states that symptoms worsened over the last 24 hours and he developed associated diffuse chest tightness and shortness of breath this evening. Patient denies any radiation of the pain. He states, "it feels like someone has hit me with a sledgehammer when the pain is bad". Patient states that he has taken his prescribed tablets of narcotics at home for pain without relief. He states that he is prescribed these for his chronic back pain. Symptoms associated with vomiting. He reports that he has been unable to keep anything down today and feels very fatigued. Patient denies associated fever, hematemesis, hemoptysis, diarrhea, melena or hematochezia, and dysuria. Patient had a normal bowel movement yesterday morning. He denies a history of abdominal surgeries. Patient also denies any history of ACS, HLD, and CVA.  Patient is a 64 y.o. male presenting with abdominal pain and vomiting. The history is provided by the patient. No language interpreter was used.  Abdominal Pain Associated symptoms: chest pain, fatigue, shortness of breath and vomiting   Associated symptoms: no diarrhea, no fever and no nausea   Emesis Associated symptoms: abdominal pain   Associated symptoms: no diarrhea     Past Medical History  Diagnosis Date  . Diabetes mellitus without complication   . Hypertension   . Depression   . COPD (chronic obstructive  pulmonary disease)   . Tubular adenoma of colon 12/1999  . Anxiety    Past Surgical History  Procedure Laterality Date  . Joint replacement Left   . Knee arthroscopy Left   . Cervical disc surgery    . Esophagogastroduodenoscopy N/A 12/28/2012    Procedure: ESOPHAGOGASTRODUODENOSCOPY (EGD);  Surgeon: Milus Banister, MD;  Location: [facility];  Service: Endoscopy;  Laterality: N/A;   Family History  Problem Relation Age of Onset  . Alzheimer's disease Mother   . Cancer Father     unknown type   History  Substance Use Topics  . Smoking status: Current Every Day Smoker -- 1.00 packs/day for 50 years    Types: Cigarettes  . Smokeless tobacco: Never Used  . Alcohol Use: No    Review of Systems  Constitutional: Positive for fatigue. Negative for fever.  Respiratory: Positive for shortness of breath.   Cardiovascular: Positive for chest pain.  Gastrointestinal: Positive for vomiting and abdominal pain. Negative for nausea, diarrhea and blood in stool.  Neurological: Positive for weakness (generalized). Negative for syncope.  All other systems reviewed and are negative.   Allergies  Fentanyl and Versed  Home Medications   Prior to Admission medications   Medication Sig Start Date End Date Taking? Authorizing Provider  albuterol (PROVENTIL HFA;VENTOLIN HFA) 108 (90 BASE) MCG/ACT inhaler Inhale 2 puffs into the lungs every 6 (six) hours as needed for wheezing or shortness of breath. 08/29/14   [name], MD  Aspirin-Acetaminophen-Caffeine (GOODY HEADACHE PO) Take 1 packet by mouth as needed (for pain or headache).    Historical Provider, MD  diazepam (VALIUM) 10 MG tablet  Take 10 mg by mouth every 8 (eight) hours as needed for anxiety.    Historical Provider, MD  insulin NPH-regular Human (NOVOLIN 70/30) (70-30) 100 UNIT/ML injection 50 units in am and 50 units just before supper every day 08/29/14   [name], MD  lisinopril (PRINIVIL,ZESTRIL) 10 MG tablet Take 10 mg  by mouth daily.    Historical Provider, MD  nicotine (NICODERM CQ - DOSED IN MG/24 HOURS) 21 mg/24hr patch Place 1 patch (21 mg total) onto the skin daily. 08/29/14   [name], MD  pantoprazole (PROTONIX) 40 MG tablet Take 1 tablet (40 mg total) by mouth 2 (two) times daily. 02/09/13   Janett Billow D. Zehr, PA-C  predniSONE (DELTASONE) 20 MG tablet Take 1 tablet (20 mg total) by mouth daily with breakfast. 08/29/14   [name], MD  tiotropium (SPIRIVA) 18 MCG inhalation capsule Place 1 capsule (18 mcg total) into inhaler and inhale daily. 08/29/14   [name], MD   BP 106/61 mmHg  Pulse 93  Temp(Src) 98.6 F (37 C) (Oral)  Resp 33  Ht 5\' 10"  (1.778 m)  Wt 230 lb (104.327 kg)  BMI 33.00 kg/m2  SpO2 94%   Physical Exam  Constitutional: He is oriented to person, place, and time. He appears well-developed and well-nourished. No distress.  Patient appears fatigued and uncomfortable.  HENT:  Head: Normocephalic and atraumatic.  Eyes: Conjunctivae and EOM are normal. No scleral icterus.  Neck: Normal range of motion.  No JVD  Cardiovascular: Regular rhythm and intact distal pulses.   Mild tachycardia  Pulmonary/Chest: He has no wheezes. He has no rales.  Tachypnea and dyspnea noted. Chest expansion symmetric. Decreased breath sounds in the RLL. Mild expiratory wheezing noted diffusely. No rales appreciated.  Abdominal: Soft. There is tenderness. There is guarding. There is no rebound.  TTP in RUQ and epigastric region. +Murphy's sign. No peritoneal signs. There is voluntary guarding.  Musculoskeletal: Normal range of motion.  Neurological: He is alert and oriented to person, place, and time. He exhibits normal muscle tone. Coordination normal.  Skin: Skin is warm and dry. No rash noted. He is not diaphoretic. No erythema. No pallor.  Psychiatric: He has a normal mood and affect. His behavior is normal.  Nursing note and vitals reviewed.   ED Course  Procedures (including  critical care time) Labs Review Labs Reviewed  CBC WITH DIFFERENTIAL/PLATELET - Abnormal; Notable for the following:    WBC 21.9 (*)    Platelets 658 (*)    Neutro Abs 16.9 (*)    Monocytes Absolute 1.3 (*)    Basophils Absolute 0.2 (*)    All other components within normal limits  COMPREHENSIVE METABOLIC PANEL - Abnormal; Notable for the following:    Potassium 5.3 (*)    Glucose, Bld 204 (*)    BUN 44 (*)    Creatinine, Ser 1.72 (*)    Albumin 2.6 (*)    GFR calc non Af Amer 40 (*)    GFR calc Af Amer 47 (*)    All other components within normal limits  BLOOD GAS, VENOUS - Abnormal; Notable for the following:    pH, Ven 7.374 (*)    pCO2, Ven 44.5 (*)    Bicarbonate 25.3 (*)    All other components within normal limits  I-STAT CG4 LACTIC ACID, ED - Abnormal; Notable for the following:    Lactic Acid, Venous 2.75 (*)    All other components within normal limits  I-STAT CHEM 8,  ED - Abnormal; Notable for the following:    Sodium 134 (*)    Potassium 5.3 (*)    BUN 41 (*)    Creatinine, Ser 1.60 (*)    Glucose, Bld 212 (*)    Calcium, Ion 1.02 (*)    All other components within normal limits  CULTURE, BLOOD (ROUTINE X 2)  CULTURE, BLOOD (ROUTINE X 2)  URINE CULTURE  LIPASE, BLOOD  URINALYSIS, ROUTINE W REFLEX MICROSCOPIC  I-STAT TROPOININ, ED    Imaging Review Ct Abdomen Pelvis Wo Contrast  01/01/2015   CLINICAL DATA:  Right-sided abdominal pain. Elevated white blood cell count.  EXAM: CT ABDOMEN AND PELVIS WITHOUT CONTRAST  TECHNIQUE: Multidetector CT imaging of the abdomen and pelvis was performed following the standard protocol without IV contrast.  COMPARISON:  Contrast-enhanced exam 12/25/2012  FINDINGS: Moderate right pleural effusion, partially loculated, measures at least 4.4 cm in depth. There are ill-defined ground-glass nodular opacities in the medial right lower lobe, partially included. No left pleural effusion.  The unenhanced liver, spleen, gallbladder,  pancreas, and adrenal glands are normal. There is mild perinephric stranding, nonspecific in unchanged from prior exam. No hydronephrosis or renal stone.  Stomach is decompressed. The previous inflammatory change with the duodenum has resolved. There are no dilated or thickened bowel loops. Moderate volume of colonic stool. There are multiple colonic diverticula in the distal colon without diverticulitis. The appendix is normal. No free air, free fluid, or intra-abdominal fluid collection.  Abdominal aorta is normal in caliber with mild atherosclerosis. There is a fat containing umbilical hernia. No retroperitoneal adenopathy.  Within the pelvis the urinary bladder is physiologically distended. Prostate gland is normal in size. No pelvic free fluid. No pelvic adenopathy. No lytic or blastic osseous lesions. There is degenerative change in the lumbar spine.  IMPRESSION: 1. No acute abnormality in the abdomen/pelvis. 2. Partially loculated right pleural effusion. There are ground-glass and nodular opacities in the right lower lobe. These are nonspecific. This may be infectious or inflammatory, however neoplasm not excluded. This is only partially included in the field of view. Dedicated follow-up chest CT could be considered for complete evaluation. Continued radiographic follow-up is recommended. 3. Diverticulosis without diverticulitis.   Electronically Signed   By: Jeb Levering M.D.   On: 01/01/2015 06:13   Dg Chest Port 1 View  01/01/2015   CLINICAL DATA:  Shortness of breath.  Nausea and vomiting.  EXAM: PORTABLE CHEST - 1 VIEW  COMPARISON:  08/26/2014  FINDINGS: There is ill-defined patchy opacity at the right lung base with volume loss. There is a right pleural effusion and fluid in the right minor fissure. Left lung is clear. The heart size is normal. There is no pneumothorax. Pulmonary vasculature is normal. No acute osseous abnormality is seen.  IMPRESSION: Right basilar consolidation with volume  loss, may reflect pneumonia versus atelectasis. There is a right pleural effusion with fluid in the right minor fissure.   Electronically Signed   By: Jeb Levering M.D.   On: 01/01/2015 05:57     EKG Interpretation   Date/Time:  Monday January 01 2015 04:50:22 EST Ventricular Rate:  101 PR Interval:  128 QRS Duration: 95 QT Interval:  345 QTC Calculation: 447 R Axis:   61 Text Interpretation:  Sinus tachycardia Atrial premature complex No  significant change since last tracing Confirmed by Kathrynn Humble, MD, Thelma Comp  [phone]) on 01/01/2015 4:58:15 AM      CRITICAL CARE Performed by: Antonietta Breach   Total critical  care time: 40  Critical care time was exclusive of separately billable procedures and treating other patients.  Critical care was necessary to treat or prevent imminent or life-threatening deterioration.  Critical care was time spent personally by me on the following activities: development of treatment plan with patient and/or surrogate as well as nursing, discussions with consultants, evaluation of patient's response to treatment, examination of patient, obtaining history from patient or surrogate, ordering and performing treatments and interventions, ordering and review of laboratory studies, ordering and review of radiographic studies, pulse oximetry and re-evaluation of patient's condition.  MDM   Final diagnoses:  RUQ abdominal pain  AKI (acute kidney injury)  Severe sepsis  Lactic acidosis  Hypotensive episode  CAP (community acquired pneumonia)  Hyperkalemia    Patient presents to the ED for worsening R sided abdominal pain with associated chest tightness and SOB. Patient noted to be mildly tachycardic as well as hypertensive on arrival. He has tachypnea with dyspnea and decreased BSs in the RLL. He has a leukocytosis of 21.9 with a mild left shift. Labs also reveal AKI with creatinine of 1.72. There is a mild hyperkalemia of 5.3. Lactate 2.75.  CXR shows  right basilar consolidation with a R pleural effusion. This is also noted on abdominal CT. Question underlying malignancy vs PNA vs possible empyema. No other acute intraabdominal findings noted. RUQ U/S pending at this time. No stones or gallbladder wall thickening noted on beside U/S.  Patient given Zosyn and Levaquin for sepsis and atypical PNA coverage. He has also been given 3L IVF; hypotension has greatly improved with this. Norepinephrine held. Dr. Kathrynn Humble has consulted with Critical Care who will evaluate the patient in the ED for admission; if not admitted to the ICU, patient likely to be admitted to Step Down. Patient is followed by Dr. Philip Aspen of [facility]. Patient will continue to be followed by Dr. Kathrynn Humble.    Filed Vitals:   01/01/15 0610 01/01/15 0615 01/01/15 0620 01/01/15 0625  BP: 113/60 101/59 102/64 106/61  Pulse: 90   93  Temp:      TempSrc:      Resp: 27 21 26  33  Height:      Weight:      SpO2: 96%   94%     Antonietta Breach, PA-C 01/01/15 0093  Varney Biles, MD 01/01/15 [phone]

## 2015-01-01 NOTE — ED Notes (Signed)
Bedside US completed

## 2015-01-01 NOTE — ED Notes (Signed)
Patient reminded that urine specimen needed Urinal at bedside

## 2015-01-01 NOTE — ED Notes (Signed)
Per Dr. Kathrynn Humble, [place] staff to be made aware that testing needs to be completed Will call us

## 2015-01-01 NOTE — ED Notes (Signed)
Patient back from CT.

## 2015-01-01 NOTE — ED Notes (Signed)
Levophed infusion currently on hold due to order parameters not met Dr. Kathrynn Humble at bedside and aware

## 2015-01-01 NOTE — ED Notes (Signed)
Dr. Kathrynn Humble at bedside VS reviewed with MD MD in agreement that parameters for Levophed infusion have not been met--continue to hold

## 2015-01-01 NOTE — ED Notes (Signed)
BP continues to improve 3rd NS bolus given, see MAR Patient without episodes of N/V since arriving to ED Unable to obtain current oral temp due to patient eating ice chips--EDP aware Patient again reminded of need for urine specimen--urinal remains at bedside

## 2015-01-01 NOTE — ED Notes (Signed)
[place] for abdomen not completed as of yet Dr. Kathrynn Humble aware MD to reassess abdomen

## 2015-01-01 NOTE — ED Notes (Signed)
Portable CXR completed Patient transported to CT dept by Cecille Rubin, [place]  Patient stable upon leaving room for testing

## 2015-01-01 NOTE — Progress Notes (Signed)
CARE MANAGEMENT NOTE 01/01/2015  Patient:  Joseph Manning, Joseph Manning   Account Number:  [id number]  Date Initiated:  01/01/2015  Documentation initiated by:  Hiren Peplinski  Subjective/Objective Assessment:   sepsis     Action/Plan:   home when stable   Anticipated DC Date:  01/04/2015   Anticipated DC Plan:  HOME/SELF CARE  In-house referral  NA      DC Planning Services  CM consult      PAC Choice  NA   Choice offered to / List presented to:  NA   DME arranged  NA      DME agency  NA     [place] arranged  NA      [place] agency  NA   Status of service:  In process, will continue to follow Medicare Important Message given?   (If response is "NO", the following Medicare IM given date fields will be blank) Date Medicare IM given:   Medicare IM given by:   Date Additional Medicare IM given:   Additional Medicare IM given by:    Discharge Disposition:    Per UR Regulation:  Reviewed for med. necessity/level of care/duration of stay  If discussed at [place] of Stay Meetings, dates discussed:    Comments:  Feb. 22 2016/Liesl Simons L. Rosana Hoes, RN, BSN, CCM. Case Management [place] [phone] No discharge needs present of time of review.

## 2015-01-01 NOTE — ED Notes (Signed)
Dr. Kathrynn Humble continues to be at bedside  MD aware the initial bolus completed MD aware of VS

## 2015-01-01 NOTE — Consult Note (Addendum)
PULMONARY / CRITICAL CARE MEDICINE   Name: Joseph Manning MRN: [id number] DOB: 08-09-1951    ADMISSION DATE:  01/01/2015 CONSULTATION DATE:  01/01/2015   REFERRING MD :  Dr Kathrynn Humble of ER  CHIEF COMPLAINT:  Sepsis with lactic acidosis and right pleural effusion  HISTORY OF PRESENT ILLNESS:   64 year old male, smoker,hxof duodenitis and copd nos. Disabled due to knee and back issues.  Presented to [facility] ER with 6 day hx of new cough with white sputum,subjective fevers and right upper quadrant pain. Symptoms are progressive and severe. They're associated with intermittent chills and dry ears but no subjective fever per se. There is no hemoptysis or colored sputum. There is no associated chest pain or edema. There is no specific aggravating or relieving factors. In the emergency department he was monitorred and found to have mild lactic acidosis associated with hypotension that relieved with fluids associated with acute kidney insufficiency that also improved with fluids. The lactic acidosis resolved with fluid therapy. Investigation revealed a right-sided loculated pleural effusion on the CT scan abdomen lung cut. Internal medicine is admitting patient but pulmonary has been asked to evaluate for the effusion   PAST MEDICAL HISTORY :   has a past medical history of Diabetes mellitus without complication; Hypertension; Depression; COPD (chronic obstructive pulmonary disease); Tubular adenoma of colon (12/1999); Anxiety; and Arthritis.  has past surgical history that includes Joint replacement (Left); Knee arthroscopy (Left); Cervical disc surgery; and Esophagogastroduodenoscopy (N/A, 12/28/2012). Prior to Admission medications   Medication Sig Start Date End Date Taking? Authorizing Provider  albuterol (PROVENTIL HFA;VENTOLIN HFA) 108 (90 BASE) MCG/ACT inhaler Inhale 2 puffs into the lungs every 6 (six) hours as needed for wheezing or shortness of breath. 08/29/14  Yes [name], MD   Aspirin-Acetaminophen-Caffeine (GOODY HEADACHE PO) Take 1 packet by mouth every 6 (six) hours as needed (for pain or headache).    Yes Historical Provider, MD  diazepam (VALIUM) 10 MG tablet Take 10 mg by mouth 3 (three) times daily.    Yes Historical Provider, MD  HYDROcodone-acetaminophen (NORCO/VICODIN) 5-325 MG per tablet Take 1 tablet by mouth every 8 (eight) hours as needed (For pain.).  12/06/14  Yes Historical Provider, MD  insulin NPH-regular Human (NOVOLIN 70/30) (70-30) 100 UNIT/ML injection 50 units in am and 50 units just before supper every day Patient taking differently: 40-45 Units 2 (two) times daily with a meal. He takes 40 units in the morning and 45 units at night. 08/29/14  Yes [name], MD  lisinopril (PRINIVIL,ZESTRIL) 10 MG tablet Take 10 mg by mouth daily.   Yes Historical Provider, MD  nicotine (NICODERM CQ - DOSED IN MG/24 HOURS) 21 mg/24hr patch Place 1 patch (21 mg total) onto the skin daily. Patient taking differently: Place 21 mg onto the skin daily as needed (For smoking cessation.).  08/29/14  Yes [name], MD  pantoprazole (PROTONIX) 40 MG tablet Take 1 tablet (40 mg total) by mouth 2 (two) times daily. 02/09/13  Yes Jessica D. Zehr, PA-C  SYMBICORT 160-4.5 MCG/ACT inhaler Inhale 1 puff into the lungs daily.  12/20/14  Yes Historical Provider, MD  predniSONE (DELTASONE) 20 MG tablet Take 1 tablet (20 mg total) by mouth daily with breakfast. Patient not taking: Reported on 01/01/2015 08/29/14   [name], MD  tiotropium (SPIRIVA) 18 MCG inhalation capsule Place 1 capsule (18 mcg total) into inhaler and inhale daily. Patient not taking: Reported on 01/01/2015 08/29/14   [name], MD  Allergies  Allergen Reactions  . Fentanyl Other (See Comments)    unknown  . Versed [Midazolam] Other (See Comments)    unknown    FAMILY HISTORY:  has no family status information on file.  SOCIAL HISTORY:  reports that he has been smoking Cigarettes.  He  has a 50 pack-year smoking history. He has never used smokeless tobacco. He reports that he does not drink alcohol or use illicit drugs.  REVIEW OF SYSTEMS:  As per history of present illness. Otherwise 11 point review of systems is negative    VITAL SIGNS: Temp:  [98.6 F (37 C)] 98.6 F (37 C) (02/22 0438) Pulse Rate:  [86-119] 86 (02/22 0900) Resp:  [21-35] 22 (02/22 0900) BP: (70-130)/(36-78) 107/60 mmHg (02/22 0900) SpO2:  [89 %-98 %] 96 % (02/22 0900) Weight:  [104.327 kg (230 lb)] 104.327 kg (230 lb) (02/22 0514) HEMODYNAMICS:   VENTILATOR SETTINGS:   INTAKE / OUTPUT: No intake or output data in the 24 hours ending 01/01/15 0911  PHYSICAL EXAMINATION: General:  Obese male, still lying in bed, poor historian, mildly disheveled, in no distress Neuro:  Alert and oriented 3. Appears to be hard of hearing HEENT:  Supple neck, but a class III Cardiovascular:  Normotensive. Normal cardiac. Normal heart sounds Lungs:  Barrel chested, mild expiratory wheeze present bilaterally. No respiratory distress Abdomen:  Obese soft and tender with rebound in the right upper quadrant Musculoskeletal:  No cyanosis no clubbing no edema Skin:  Intact anteriorly  LABS:  PULMONARY  Recent Labs Lab 01/01/15 0508 01/01/15 0510  HCO3  --  25.3*  TCO2 21 22.9  O2SAT  --  29.7    CBC  Recent Labs Lab 01/01/15 0453 01/01/15 0508  HGB 15.2 17.0  HCT 45.6 50.0  WBC 21.9*  --   PLT 658*  --     COAGULATION No results for input(s): INR in the last 168 hours.  CARDIAC  No results for input(s): TROPONINI in the last 168 hours. No results for input(s): PROBNP in the last 168 hours.   CHEMISTRY  Recent Labs Lab 01/01/15 0453 01/01/15 0508  NA 135 134*  K 5.3* 5.3*  CL 98 101  CO2 24  --   GLUCOSE 204* 212*  BUN 44* 41*  CREATININE 1.72* 1.60*  CALCIUM 8.6  --    Estimated Creatinine Clearance: 56.4 mL/min (by C-G formula based on Cr of 1.6).   LIVER  Recent  Labs Lab 01/01/15 0453  AST 25  ALT 25  ALKPHOS 102  BILITOT 0.5  PROT 7.6  ALBUMIN 2.6*     INFECTIOUS  Recent Labs Lab 01/01/15 0515 01/01/15 0820  LATICACIDVEN 2.75* 0.87     ENDOCRINE CBG (last 3)  No results for input(s): GLUCAP in the last 72 hours.       IMAGING x48h Ct Abdomen Pelvis Wo Contrast  01/01/2015   CLINICAL DATA:  Right-sided abdominal pain. Elevated white blood cell count.  EXAM: CT ABDOMEN AND PELVIS WITHOUT CONTRAST  TECHNIQUE: Multidetector CT imaging of the abdomen and pelvis was performed following the standard protocol without IV contrast.  COMPARISON:  Contrast-enhanced exam 12/25/2012  FINDINGS: Moderate right pleural effusion, partially loculated, measures at least 4.4 cm in depth. There are ill-defined ground-glass nodular opacities in the medial right lower lobe, partially included. No left pleural effusion.  The unenhanced liver, spleen, gallbladder, pancreas, and adrenal glands are normal. There is mild perinephric stranding, nonspecific in unchanged from prior exam. No hydronephrosis or  renal stone.  Stomach is decompressed. The previous inflammatory change with the duodenum has resolved. There are no dilated or thickened bowel loops. Moderate volume of colonic stool. There are multiple colonic diverticula in the distal colon without diverticulitis. The appendix is normal. No free air, free fluid, or intra-abdominal fluid collection.  Abdominal aorta is normal in caliber with mild atherosclerosis. There is a fat containing umbilical hernia. No retroperitoneal adenopathy.  Within the pelvis the urinary bladder is physiologically distended. Prostate gland is normal in size. No pelvic free fluid. No pelvic adenopathy. No lytic or blastic osseous lesions. There is degenerative change in the lumbar spine.  IMPRESSION: 1. No acute abnormality in the abdomen/pelvis. 2. Partially loculated right pleural effusion. There are ground-glass and nodular opacities  in the right lower lobe. These are nonspecific. This may be infectious or inflammatory, however neoplasm not excluded. This is only partially included in the field of view. Dedicated follow-up chest CT could be considered for complete evaluation. Continued radiographic follow-up is recommended. 3. Diverticulosis without diverticulitis.   Electronically Signed   By: Jeb Levering M.D.   On: 01/01/2015 06:13   US Abdomen Limited  01/01/2015   CLINICAL DATA:  Right upper quadrant pain for 2 weeks.  EXAM: US ABDOMEN LIMITED - RIGHT UPPER QUADRANT  COMPARISON:  CT abdomen and pelvis earlier this same day.  FINDINGS: Gallbladder:  No gallstones or wall thickening visualized. No sonographic Murphy sign noted.  Common bile duct:  Diameter: 0.7 cm  Liver:  There is increased echogenicity consistent with diffuse fatty infiltration. No focal liver lesion or intrahepatic biliary ductal dilatation. Right pleural effusion is partially visualized.  IMPRESSION: Negative for gallstones or acute abnormality.  Diffuse fatty infiltration of the liver.  Right pleural effusion.   Electronically Signed   By: Inge Rise M.D.   On: 01/01/2015 07:15   Dg Chest Port 1 View  01/01/2015   CLINICAL DATA:  Shortness of breath.  Nausea and vomiting.  EXAM: PORTABLE CHEST - 1 VIEW  COMPARISON:  08/26/2014  FINDINGS: There is ill-defined patchy opacity at the right lung base with volume loss. There is a right pleural effusion and fluid in the right minor fissure. Left lung is clear. The heart size is normal. There is no pneumothorax. Pulmonary vasculature is normal. No acute osseous abnormality is seen.  IMPRESSION: Right basilar consolidation with volume loss, may reflect pneumonia versus atelectasis. There is a right pleural effusion with fluid in the right minor fissure.   Electronically Signed   By: Jeb Levering M.D.   On: 01/01/2015 05:57        ASSESSMENT / PLAN:  PULMONARY  A:  Baseline  - Smoker with COPD not  otherwise specified  Current  - Possibly an acute exacerbation because he is wheezing - Appears to have right-sided loculated effusion as a result of possibly community acquired pneumonia  P:   Infectious diseases management per ID section for the pneumonia Aggressive bronchodilator therapy scheduled; if this does not help start IV steroids Ultrasound-guided thoracentesis of the right pleural effusion; evaluate for exudate and depending on LDH results and glucose results might need VATS CT scan of the chest postthoracentesis DC lovenox in anticipation of thora; SCDs till then  CARDIOVASCULAR CVL  A: Occult septic shock due to high lactic acid and hypotension that resolved with fluid  P:  Increase fluid rate to 125 ML/h Cycle enzymes   RENAL A:  Acute kidney insufficiency improving with fluids due to sepsis  P:   Monitor with fluid therapy   INFECTIOUS A:  Right loculated pleural effusion versus empyema P:   Broad-spectrum antibiotics per primary service back in [place] Check urine Legionella, Streptococcus,  Nasal swab respiratory virus panel Add IV azithromycin for atypicals   FAMILY  - Updates: Updated patient at the bedside in the emergency department    Dr. Brand Males, M.D., [facility].C.P Pulmonary and Critical Care Medicine Staff Physician [facility] Pager: [phone], If no answer or between  15:00h - 7:00h: call [phone]  01/01/2015 9:24 AM

## 2015-01-01 NOTE — ED Notes (Signed)
CXR at bedside

## 2015-01-01 NOTE — H&P (Signed)
Joseph Manning is an 64 y.o. male.   Chief Complaint: It hurts on my right side when I breathe in deeply HPI:  The patient is a 64 year old Caucasian man with multiple medical problems who presented to the emergency room with shortness of breath and right-sided pleuritic chest pain over the past few days.  His also had a fairly dry cough without fever or chills.  He is having shortness breath with any activity.  He has a long history of moderate tobacco abuse and has diabetes mellitus, type II for which she intermittently does not take his insulin, also has a history of chronic pain syndrome for which she is on moderate dose narcotics regularly.  On December 26, 2014 he called our office with complaints of shortness breath and right-sided chest pain as well as dizziness.  He was advised to come to our office or go to the emergency room for evaluation.  His past medical history is otherwise significant for severe duodenitis confirmed by endoscopy and treated with high-dose proton pump inhibitor treatment.  Besides the cough and right-sided pleuritic chest pain is also had some right upper quadrant abdominal pain.  He has been able to eat and drink well and he has not had problems with constipation or diarrhea.  He also has a history of a moderate elevation of PSA level in the 15 range for which he has declined recheck of the PSA level or urologist evaluation despite discussions that this could represent prostate cancer. Past Medical History  Diagnosis Date  . Diabetes mellitus without complication   . Hypertension   . Depression   . COPD (chronic obstructive pulmonary disease)   . Tubular adenoma of colon 12/1999  . Anxiety   . Arthritis     Medications Prior to Admission  Medication Sig Dispense Refill  . albuterol (PROVENTIL HFA;VENTOLIN HFA) 108 (90 BASE) MCG/ACT inhaler Inhale 2 puffs into the lungs every 6 (six) hours as needed for wheezing or shortness of breath. 1 Inhaler 2  .  Aspirin-Acetaminophen-Caffeine (GOODY HEADACHE PO) Take 1 packet by mouth every 6 (six) hours as needed (for pain or headache).     . diazepam (VALIUM) 10 MG tablet Take 10 mg by mouth 3 (three) times daily.     Marland Kitchen HYDROcodone-acetaminophen (NORCO/VICODIN) 5-325 MG per tablet Take 1-2 tablets by mouth 3 (three) times daily. He takes two tablets in the morning and evening and one tablet midday.  0  . insulin NPH-regular Human (NOVOLIN 70/30) (70-30) 100 UNIT/ML injection 50 units in am and 50 units just before supper every day (Patient taking differently: 40-45 Units 2 (two) times daily with a meal. He takes 40 units in the morning and 45 units at night.) 20 mL 11  . lisinopril (PRINIVIL,ZESTRIL) 10 MG tablet Take 10 mg by mouth daily.    . nicotine (NICODERM CQ - DOSED IN MG/24 HOURS) 21 mg/24hr patch Place 1 patch (21 mg total) onto the skin daily. (Patient taking differently: Place 21 mg onto the skin daily as needed (For smoking cessation.). ) 28 patch 0  . pantoprazole (PROTONIX) 40 MG tablet Take 1 tablet (40 mg total) by mouth 2 (two) times daily. 60 tablet 3  . SYMBICORT 160-4.5 MCG/ACT inhaler Inhale 1 puff into the lungs daily.   3  . predniSONE (DELTASONE) 20 MG tablet Take 1 tablet (20 mg total) by mouth daily with breakfast. (Patient not taking: Reported on 01/01/2015) 10 tablet 0  . tiotropium (SPIRIVA) 18 MCG inhalation capsule  Place 1 capsule (18 mcg total) into inhaler and inhale daily. (Patient not taking: Reported on 01/01/2015) 30 capsule 12    ADDITIONAL HOME MEDICATIONS: no additional medications  PHYSICIANS INVOLVED IN CARE: Owens Loffler (GI), [name] (primary care)  Past Surgical History  Procedure Laterality Date  . Joint replacement Left   . Knee arthroscopy Left   . Cervical disc surgery    . Esophagogastroduodenoscopy N/A 12/28/2012    Procedure: ESOPHAGOGASTRODUODENOSCOPY (EGD);  Surgeon: Milus Banister, MD;  Location: [facility];  Service: Endoscopy;   Laterality: N/A;    Family History  Problem Relation Age of Onset  . Alzheimer's disease Mother   . Cancer Father     unknown type     Social History:  reports that he has been smoking Cigarettes.  He has a 50 pack-year smoking history. He has never used smokeless tobacco. He reports that he does not drink alcohol or use illicit drugs.  Allergies:  Allergies  Allergen Reactions  . Fentanyl Other (See Comments)    unknown  . Versed [Midazolam] Other (See Comments)    unknown     ROS: arthritis, diabetes, emphysema, high blood pressure, tobacco use and ulcers  PHYSICAL EXAM: Blood pressure 92/55, pulse 95, temperature 97.8 F (36.6 C), temperature source Oral, resp. rate 26, height '5\' 8"'  (1.727 m), weight 102.286 kg (225 lb 8 oz), SpO2 93 %. In general, he is a overweight white man who had mild right-sided pleuritic chest pain, but no shortness of breath on his cannula oxygen.  He has moderate bilateral hearing loss.  HEENT exam was within normal limits, neck was supple without jugular venous distention or carotid bruit, chest had bilateral scattered wheezing that was minimal and decreased breath sounds in the right lung base.  Heart had a regular rate and rhythm without significant murmur or gallop, abdomen had normal bowel sounds and no hepatosplenomegaly or tenderness, extremities were without cyanosis, clubbing, or edema.  He was alert and well oriented with normal affect and could move all extremities well.  Results for orders placed or performed during the hospital encounter of 01/01/15 (from the past 48 hour(s))  CBC with Differential     Status: Abnormal   Collection Time: 01/01/15  4:53 AM  Result Value Ref Range   WBC 21.9 (H) 4.0 - 10.5 K/uL   RBC 4.74 4.22 - 5.81 MIL/uL   Hemoglobin 15.2 13.0 - 17.0 g/dL   HCT 45.6 39.0 - 52.0 %   MCV 96.2 78.0 - 100.0 fL   MCH 32.1 26.0 - 34.0 pg   MCHC 33.3 30.0 - 36.0 g/dL   RDW 12.9 11.5 - 15.5 %   Platelets 658 (H) 150 - 400  K/uL   Neutrophils Relative % 77 43 - 77 %   Lymphocytes Relative 15 12 - 46 %   Monocytes Relative 6 3 - 12 %   Eosinophils Relative 1 0 - 5 %   Basophils Relative 1 0 - 1 %   Neutro Abs 16.9 (H) 1.7 - 7.7 K/uL   Lymphs Abs 3.3 0.7 - 4.0 K/uL   Monocytes Absolute 1.3 (H) 0.1 - 1.0 K/uL   Eosinophils Absolute 0.2 0.0 - 0.7 K/uL   Basophils Absolute 0.2 (H) 0.0 - 0.1 K/uL   WBC Morphology MILD LEFT SHIFT (1-5% METAS, OCC MYELO, OCC BANDS)   Comprehensive metabolic panel     Status: Abnormal   Collection Time: 01/01/15  4:53 AM  Result Value Ref Range  Sodium 135 135 - 145 mmol/L   Potassium 5.3 (H) 3.5 - 5.1 mmol/L   Chloride 98 96 - 112 mmol/L   CO2 24 19 - 32 mmol/L   Glucose, Bld 204 (H) 70 - 99 mg/dL   BUN 44 (H) 6 - 23 mg/dL   Creatinine, Ser 1.72 (H) 0.50 - 1.35 mg/dL   Calcium 8.6 8.4 - 10.5 mg/dL   Total Protein 7.6 6.0 - 8.3 g/dL   Albumin 2.6 (L) 3.5 - 5.2 g/dL   AST 25 0 - 37 U/L   ALT 25 0 - 53 U/L   Alkaline Phosphatase 102 39 - 117 U/L   Total Bilirubin 0.5 0.3 - 1.2 mg/dL   GFR calc non Af Amer 40 (L) >90 mL/min   GFR calc Af Amer 47 (L) >90 mL/min    Comment: (NOTE) The eGFR has been calculated using the CKD EPI equation. This calculation has not been validated in all clinical situations. eGFR's persistently <90 mL/min signify possible Chronic Kidney Disease.    Anion gap 13 5 - 15  Lipase, blood     Status: None   Collection Time: 01/01/15  4:53 AM  Result Value Ref Range   Lipase 40 11 - 59 U/L  I-Stat Troponin, ED (not at [facility])     Status: None   Collection Time: 01/01/15  5:06 AM  Result Value Ref Range   Troponin i, poc 0.02 0.00 - 0.08 ng/mL   Comment 3            Comment: Due to the release kinetics of cTnI, a negative result within the first hours of the onset of symptoms does not rule out myocardial infarction with certainty. If myocardial infarction is still suspected, repeat the test at appropriate intervals.   I-stat chem 8, ed      Status: Abnormal   Collection Time: 01/01/15  5:08 AM  Result Value Ref Range   Sodium 134 (L) 135 - 145 mmol/L   Potassium 5.3 (H) 3.5 - 5.1 mmol/L   Chloride 101 96 - 112 mmol/L   BUN 41 (H) 6 - 23 mg/dL   Creatinine, Ser 1.60 (H) 0.50 - 1.35 mg/dL   Glucose, Bld 212 (H) 70 - 99 mg/dL   Calcium, Ion 1.02 (L) 1.13 - 1.30 mmol/L   TCO2 21 0 - 100 mmol/L   Hemoglobin 17.0 13.0 - 17.0 g/dL   HCT 50.0 39.0 - 52.0 %  Blood gas, venous     Status: Abnormal   Collection Time: 01/01/15  5:10 AM  Result Value Ref Range   FIO2 0.21 %   pH, Ven 7.374 (H) 7.250 - 7.300   pCO2, Ven 44.5 (L) 45.0 - 50.0 mmHg   pO2, Ven BELOW REPORTABLE RANGE. 30.0 - 45.0 mmHg    Comment: CRITICAL RESULT CALLED TO, READ BACK BY AND VERIFIED WITH: ANKIT NANVATI,MD AT 0520 ON 01/01/15 BY KIMBERLY Jarquin,RRT,RCP    Bicarbonate 25.3 (H) 20.0 - 24.0 mEq/L   TCO2 22.9 0 - 100 mmol/L   Acid-Base Excess 0.3 0.0 - 2.0 mmol/L   O2 Saturation 29.7 %   Patient temperature 98.6    Collection site VEIN    Drawn by COLLECTED BY LABORATORY    Sample type VENOUS   I-Stat CG4 Lactic Acid, ED     Status: Abnormal   Collection Time: 01/01/15  5:15 AM  Result Value Ref Range   Lactic Acid, Venous 2.75 (HH) 0.5 - 2.0 mmol/L   Comment NOTIFIED  PHYSICIAN   Urinalysis, Routine w reflex microscopic     Status: Abnormal   Collection Time: 01/01/15  7:58 AM  Result Value Ref Range   Color, Urine YELLOW YELLOW   APPearance CLEAR CLEAR   Specific Gravity, Urine 1.018 1.005 - 1.030   pH 6.0 5.0 - 8.0   Glucose, UA 100 (A) NEGATIVE mg/dL   Hgb urine dipstick NEGATIVE NEGATIVE   Bilirubin Urine NEGATIVE NEGATIVE   Ketones, ur NEGATIVE NEGATIVE mg/dL   Protein, ur NEGATIVE NEGATIVE mg/dL   Urobilinogen, UA 1.0 0.0 - 1.0 mg/dL   Nitrite NEGATIVE NEGATIVE   Leukocytes, UA NEGATIVE NEGATIVE    Comment: MICROSCOPIC NOT DONE ON URINES WITH NEGATIVE PROTEIN, BLOOD, LEUKOCYTES, NITRITE, OR GLUCOSE <1000 mg/dL.  I-Stat CG4 Lactic Acid, ED      Status: None   Collection Time: 01/01/15  8:20 AM  Result Value Ref Range   Lactic Acid, Venous 0.87 0.5 - 2.0 mmol/L  Creatinine, serum     Status: Abnormal   Collection Time: 01/01/15  9:44 AM  Result Value Ref Range   Creatinine, Ser 1.22 0.50 - 1.35 mg/dL   GFR calc non Af Amer 61 (L) >90 mL/min   GFR calc Af Amer 71 (L) >90 mL/min    Comment: (NOTE) The eGFR has been calculated using the CKD EPI equation. This calculation has not been validated in all clinical situations. eGFR's persistently <90 mL/min signify possible Chronic Kidney Disease.   Lactate dehydrogenase     Status: None   Collection Time: 01/01/15  9:44 AM  Result Value Ref Range   LDH 163 94 - 250 U/L  Albumin     Status: Abnormal   Collection Time: 01/01/15  9:44 AM  Result Value Ref Range   Albumin 2.1 (L) 3.5 - 5.2 g/dL  Troponin I     Status: None   Collection Time: 01/01/15  9:44 AM  Result Value Ref Range   Troponin I <0.03 <0.031 ng/mL    Comment:        NO INDICATION OF MYOCARDIAL INJURY.   Brain natriuretic peptide     Status: None   Collection Time: 01/01/15  9:44 AM  Result Value Ref Range   B Natriuretic Peptide 74.5 0.0 - 100.0 pg/mL  Amylase     Status: None   Collection Time: 01/01/15  9:44 AM  Result Value Ref Range   Amylase 57 0 - 105 U/L  MRSA PCR Screening     Status: None   Collection Time: 01/01/15  9:56 AM  Result Value Ref Range   MRSA by PCR NEGATIVE NEGATIVE    Comment:        The GeneXpert MRSA Assay (FDA approved for NASAL specimens only), is one component of a comprehensive MRSA colonization surveillance program. It is not intended to diagnose MRSA infection nor to guide or monitor treatment for MRSA infections.   Glucose, capillary     Status: Abnormal   Collection Time: 01/01/15 11:54 AM  Result Value Ref Range   Glucose-Capillary 328 (H) 70 - 99 mg/dL   Comment 1 Notify RN    Comment 2 Document in Chart   Body fluid cell count with differential      Status: Abnormal   Collection Time: 01/01/15  3:51 PM  Result Value Ref Range   Fluid Type-FCT PLEURAL     Comment: CORRECTED ON 02/22 AT 1551: PREVIOUSLY REPORTED AS Pleural R   Color, Fluid AMBER (A) YELLOW   Appearance,  Fluid TURBID (A) CLEAR   WBC, Fluid 1221 (H) 0 - 1000 cu mm   Neutrophil Count, Fluid 6 0 - 25 %   Lymphs, Fluid 75 %   Monocyte-Macrophage-Serous Fluid 19 (L) 50 - 90 %   Other Cells, Fluid CORRELATE WITH CYTOLOGY. %  Glucose, capillary     Status: Abnormal   Collection Time: 01/01/15  4:48 PM  Result Value Ref Range   Glucose-Capillary 219 (H) 70 - 99 mg/dL   Ct Abdomen Pelvis Wo Contrast  01/01/2015   CLINICAL DATA:  Right-sided abdominal pain. Elevated white blood cell count.  EXAM: CT ABDOMEN AND PELVIS WITHOUT CONTRAST  TECHNIQUE: Multidetector CT imaging of the abdomen and pelvis was performed following the standard protocol without IV contrast.  COMPARISON:  Contrast-enhanced exam 12/25/2012  FINDINGS: Moderate right pleural effusion, partially loculated, measures at least 4.4 cm in depth. There are ill-defined ground-glass nodular opacities in the medial right lower lobe, partially included. No left pleural effusion.  The unenhanced liver, spleen, gallbladder, pancreas, and adrenal glands are normal. There is mild perinephric stranding, nonspecific in unchanged from prior exam. No hydronephrosis or renal stone.  Stomach is decompressed. The previous inflammatory change with the duodenum has resolved. There are no dilated or thickened bowel loops. Moderate volume of colonic stool. There are multiple colonic diverticula in the distal colon without diverticulitis. The appendix is normal. No free air, free fluid, or intra-abdominal fluid collection.  Abdominal aorta is normal in caliber with mild atherosclerosis. There is a fat containing umbilical hernia. No retroperitoneal adenopathy.  Within the pelvis the urinary bladder is physiologically distended. Prostate gland is  normal in size. No pelvic free fluid. No pelvic adenopathy. No lytic or blastic osseous lesions. There is degenerative change in the lumbar spine.  IMPRESSION: 1. No acute abnormality in the abdomen/pelvis. 2. Partially loculated right pleural effusion. There are ground-glass and nodular opacities in the right lower lobe. These are nonspecific. This may be infectious or inflammatory, however neoplasm not excluded. This is only partially included in the field of view. Dedicated follow-up chest CT could be considered for complete evaluation. Continued radiographic follow-up is recommended. 3. Diverticulosis without diverticulitis.   Electronically Signed   By: Jeb Levering M.D.   On: 01/01/2015 06:13   Dg Chest 1 View  01/01/2015   CLINICAL DATA:  Status post right-sided thoracentesis  EXAM: CHEST  1 VIEW  COMPARISON:  Study obtained earlier in the day  FINDINGS: There is no demonstrable pneumothorax. Right effusion is smaller post thoracentesis. There is still a small right effusion with patchy consolidation in the right base. There is atelectatic change in the right mid lung. Left lung is clear. Heart size and pulmonary vascularity are normal. No adenopathy.  IMPRESSION: Right effusion smaller post thoracentesis. No pneumothorax. Patchy consolidation right base. Mild atelectasis right midlung. Left lung clear.   Electronically Signed   By: Lowella Grip III M.D.   On: 01/01/2015 16:21   US Abdomen Limited  01/01/2015   CLINICAL DATA:  Right upper quadrant pain for 2 weeks.  EXAM: US ABDOMEN LIMITED - RIGHT UPPER QUADRANT  COMPARISON:  CT abdomen and pelvis earlier this same day.  FINDINGS: Gallbladder:  No gallstones or wall thickening visualized. No sonographic Murphy sign noted.  Common bile duct:  Diameter: 0.7 cm  Liver:  There is increased echogenicity consistent with diffuse fatty infiltration. No focal liver lesion or intrahepatic biliary ductal dilatation. Right pleural effusion is partially  visualized.  IMPRESSION: Negative  for gallstones or acute abnormality.  Diffuse fatty infiltration of the liver.  Right pleural effusion.   Electronically Signed   By: Inge Rise M.D.   On: 01/01/2015 07:15   Dg Chest Port 1 View  01/01/2015   CLINICAL DATA:  Shortness of breath.  Nausea and vomiting.  EXAM: PORTABLE CHEST - 1 VIEW  COMPARISON:  08/26/2014  FINDINGS: There is ill-defined patchy opacity at the right lung base with volume loss. There is a right pleural effusion and fluid in the right minor fissure. Left lung is clear. The heart size is normal. There is no pneumothorax. Pulmonary vasculature is normal. No acute osseous abnormality is seen.  IMPRESSION: Right basilar consolidation with volume loss, may reflect pneumonia versus atelectasis. There is a right pleural effusion with fluid in the right minor fissure.   Electronically Signed   By: Jeb Levering M.D.   On: 01/01/2015 05:57   US Thoracentesis Asp Pleural Space W/img Guide  01/01/2015   INDICATION: Symptomatic right sided pleural effusion  EXAM: US THORACENTESIS ASP PLEURAL SPACE W/IMG GUIDE  COMPARISON:  None.  MEDICATIONS: None  COMPLICATIONS: None immediate  TECHNIQUE: Informed written consent was obtained from the patient after a discussion of the risks, benefits and alternatives to treatment. A timeout was performed prior to the initiation of the procedure.  Initial ultrasound scanning demonstrates a right pleural effusion. The lower chest was prepped and draped in the usual sterile fashion. 1% lidocaine was used for local anesthesia.  Under direct ultrasound guidance, a 19 gauge, 10-cm, Yueh catheter was introduced. An ultrasound image was saved for documentation purposes. The thoracentesis was performed. The catheter was removed and a dressing was applied. The patient tolerated the procedure well without immediate post procedural complication. The patient was escorted to have an upright chest radiograph.  FINDINGS: A total  of approximately 358m of serous fluid was removed. Requested samples were sent to the laboratory.  IMPRESSION: Successful ultrasound-guided right sided thoracentesis yielding 3637mof pleural fluid.  Read By:  KoTsosie BillingA-C   Electronically Signed   By: D Lucrezia Europe.D.   On: 01/01/2015 15:51     Assessment/Plan #1 Community-Acquired Pneumonia: he initially presented with hypotension and lactic acidosis.  His symptoms and blood pressure have improved with IV fluids and IV antibiotics.  We will continue broad-spectrum antibiotics with Zosyn and vancomycin for severe community-acquired pneumonia.  We will recheck a CBC and complete metabolic panel tomorrow.  We will provide oxygen support as needed. #2 COPD: severe and from chronic heavy dose cigarette smoking.  We will treat this with regular nebulizers as well as short term prednisone treatment. #3 Right Upper Quadrant Abdominal Pain: Possibly due to recurrent duodenal inflammation given nonacute findings on right upper quadrant ultrasound exam, so we will continue treatment with high-dose proton pump inhibitor treatment intravenously. #4 Diabetes Mellitus, Type II: He has fair control of diabetes in general, because he is not diligent with taking his insulin regularly.  We will treat him in the hospital with resistance scale sliding scale insulin with a low threshold for starting a basal insulin. #5 Sepsis Syndrome: Improved with IV fluids and IV antibiotics  Haeley Fordham G 01/01/2015, 6:10 PM

## 2015-01-01 NOTE — ED Notes (Signed)
Patient reports that he is not able to provide urine specimen at this time Patient is adamantly refusing In and Out cath Dr. Kathrynn Humble made aware

## 2015-01-01 NOTE — ED Notes (Signed)
[place] called and made aware that EDP wants testing completed  US staff state that ETA 10 minutes Dr. Kathrynn Humble made aware

## 2015-01-01 NOTE — Progress Notes (Signed)
Patient transferring to 1516.  Report called to Simona Huh, Therapist, sports.  Patient to travel by wheelchair.  Will continue to monitor.

## 2015-01-01 NOTE — ED Notes (Signed)
[place] tech at bedside for testing

## 2015-01-01 NOTE — ED Notes (Signed)
RT called and made aware of order for VBG

## 2015-01-01 NOTE — Progress Notes (Signed)
Inpatient Diabetes Program Recommendations  AACE/ADA: New Consensus Statement on Inpatient Glycemic Control (2013)  Target Ranges:  Prepandial:   less than 140 mg/dL      Peak postprandial:   less than 180 mg/dL (1-2 hours)      Critically ill patients:  140 - 180 mg/dL   Reason for Visit: Hyperglycemia  Diabetes history: DM2 Outpatient Diabetes medications: 70/30 40 units in am and 45 ac Dinner Current orders for Inpatient glycemic control: Novolog resistant tidwc and hs + 4 units tidwc.  Inpatient Diabetes Program Recommendations Insulin - Basal: Add 70/30 20 units in am and 22 units ac dinner HgbA1C: Pending Diet: Add CHO mod med  Note: Will follow. Thank you. Lorenda Peck, RD, LDN, CDE Inpatient Diabetes Coordinator [phone]

## 2015-01-01 NOTE — ED Notes (Signed)
Kelly, PA at bedside. 

## 2015-01-01 NOTE — Progress Notes (Addendum)
ANTIBIOTIC CONSULT NOTE - INITIAL  Pharmacy Consult for vancomycin (zosyn per MD) Indication: pneumonia  Allergies  Allergen Reactions  . Fentanyl Other (See Comments)    unknown  . Versed [Midazolam] Other (See Comments)    unknown    Patient Measurements: Height: 5\' 10"  (177.8 cm) Weight: 230 lb (104.327 kg) IBW/kg (Calculated) : 73 Adjusted Body Weight:   Vital Signs: Temp: 98.6 F (37 C) (02/22 0438) Temp Source: Oral (02/22 0438) BP: 106/73 mmHg (02/22 0730) Pulse Rate: 94 (02/22 0730) Intake/Output from previous day:   Intake/Output from this shift:    Labs:  Recent Labs  01/01/15 0453 01/01/15 0508  WBC 21.9*  --   HGB 15.2 17.0  PLT 658*  --   CREATININE 1.72* 1.60*   Estimated Creatinine Clearance: 56.4 mL/min (by C-G formula based on Cr of 1.6). No results for input(s): VANCOTROUGH, VANCOPEAK, VANCORANDOM, GENTTROUGH, GENTPEAK, GENTRANDOM, TOBRATROUGH, TOBRAPEAK, TOBRARND, AMIKACINPEAK, AMIKACINTROU, AMIKACIN in the last 72 hours.   Microbiology: No results found for this or any previous visit (from the past 720 hour(s)).  Medical History: Past Medical History  Diagnosis Date  . Diabetes mellitus without complication   . Hypertension   . Depression   . COPD (chronic obstructive pulmonary disease)   . Tubular adenoma of colon 12/1999  . Anxiety   . Arthritis    Assessment: 1 YOM presents abdominal pain, chest pain, and shortness of breath.  At admission his labs reveal leukocytosis, elevated lactic acid (2.75), and AKI with hyperkalemia.  CXR reveals RLL consolidation with pleural effusion (? PNA vs empyema vs underlying malignancy).    2/22 >> levofloxacin x1  >> 2/22 >> pip/tazo (MD)  >>   2/22 >> vancomycin >>  Tmax: afeb WBCs: elevated Renal: SCr = 1.6 for normalized CrCL = 28ml/min  2/22 blood: IP 2/22 urine:  IP  Goal of Therapy:  Vancomycin trough level 15-20 mcg/ml  Plan:   Vancomycin 2gm IV x 1 then 1250mg  IV  q24h  Monitor renal function and adjust dose/check levels as appropriate  Change zosyn to 3.375gm IV q8h over 4h infusion  Doreene Eland, PharmD, BCPS.   Pager: [phone]  01/01/2015,8:33 AM

## 2015-01-01 NOTE — ED Notes (Signed)
Pt presents with c/o right side abdominal pain for approx one week. Pt reports that he has been unable to keep anything down and has been vomiting with the pain as well. Pt reports the pain moves around to his back.

## 2015-01-02 ENCOUNTER — Inpatient Hospital Stay (HOSPITAL_COMMUNITY): Payer: Medicaid Other

## 2015-01-02 DIAGNOSIS — J441 Chronic obstructive pulmonary disease with (acute) exacerbation: Secondary | ICD-10-CM

## 2015-01-02 LAB — GLUCOSE, CAPILLARY
GLUCOSE-CAPILLARY: 203 mg/dL — AB (ref 70–99)
GLUCOSE-CAPILLARY: 327 mg/dL — AB (ref 70–99)
Glucose-Capillary: 258 mg/dL — ABNORMAL HIGH (ref 70–99)
Glucose-Capillary: 428 mg/dL — ABNORMAL HIGH (ref 70–99)
Glucose-Capillary: 238 mg/dL — ABNORMAL HIGH (ref 70–99)

## 2015-01-02 LAB — COMPREHENSIVE METABOLIC PANEL
ALBUMIN: 2.2 g/dL — AB (ref 3.5–5.2)
ALT: 18 U/L (ref 0–53)
AST: 14 U/L (ref 0–37)
Alkaline Phosphatase: 79 U/L (ref 39–117)
Anion gap: 4 — ABNORMAL LOW (ref 5–15)
BUN: 25 mg/dL — AB (ref 6–23)
CHLORIDE: 106 mmol/L (ref 96–112)
CO2: 24 mmol/L (ref 19–32)
Calcium: 7.7 mg/dL — ABNORMAL LOW (ref 8.4–10.5)
Creatinine, Ser: 1.23 mg/dL (ref 0.50–1.35)
GFR calc non Af Amer: 60 mL/min — ABNORMAL LOW (ref >90)
GFR, EST AFRICAN AMERICAN: 70 mL/min — AB (ref >90)
Glucose, Bld: 318 mg/dL — ABNORMAL HIGH (ref 70–99)
POTASSIUM: 4.9 mmol/L (ref 3.5–5.1)
Sodium: 134 mmol/L — ABNORMAL LOW (ref 135–145)
TOTAL PROTEIN: 6.1 g/dL (ref 6.0–8.3)
Total Bilirubin: 0.4 mg/dL (ref 0.3–1.2)

## 2015-01-02 LAB — PHOSPHORUS: Phosphorus: 3.2 mg/dL (ref 2.3–4.6)

## 2015-01-02 LAB — CBC
HCT: 38.9 % — ABNORMAL LOW (ref 39.0–52.0)
Hemoglobin: 12.2 g/dL — ABNORMAL LOW (ref 13.0–17.0)
MCH: 30.9 pg (ref 26.0–34.0)
MCHC: 31.4 g/dL (ref 30.0–36.0)
MCV: 98.5 fL (ref 78.0–100.0)
Platelets: 623 10*3/uL — ABNORMAL HIGH (ref 150–400)
RBC: 3.95 MIL/uL — ABNORMAL LOW (ref 4.22–5.81)
RDW: 13.2 % (ref 11.5–15.5)
WBC: 17.1 10*3/uL — ABNORMAL HIGH (ref 4.0–10.5)

## 2015-01-02 LAB — URINE CULTURE
COLONY COUNT: NO GROWTH
Culture: NO GROWTH

## 2015-01-02 LAB — MAGNESIUM: Magnesium: 2.3 mg/dL (ref 1.5–2.5)

## 2015-01-02 LAB — LEGIONELLA ANTIGEN, URINE

## 2015-01-02 LAB — STREP PNEUMONIAE URINARY ANTIGEN: STREP PNEUMO URINARY ANTIGEN: NEGATIVE

## 2015-01-02 LAB — RESPIRATORY VIRUS PANEL
ADENOVIRUS: NEGATIVE
INFLUENZA B 1: NEGATIVE
Influenza A: NEGATIVE
Metapneumovirus: NEGATIVE
Parainfluenza 1: NEGATIVE
Parainfluenza 2: NEGATIVE
Parainfluenza 3: NEGATIVE
Respiratory Syncytial Virus A: NEGATIVE
Respiratory Syncytial Virus B: NEGATIVE
Rhinovirus: POSITIVE — AB

## 2015-01-02 LAB — PH, BODY FLUID: pH, Fluid: 8

## 2015-01-02 LAB — PROCALCITONIN: Procalcitonin: 0.13 ng/mL

## 2015-01-02 LAB — TROPONIN I: Troponin I: <0.03 ng/mL (ref <0.031)

## 2015-01-02 LAB — GLUCOSE, RANDOM: Glucose, Bld: 395 mg/dL — ABNORMAL HIGH (ref 70–99)

## 2015-01-02 LAB — AMYLASE, PLEURAL FLUID: Amylase, Pleural Fluid: 23 U/L

## 2015-01-02 MED ORDER — ENOXAPARIN SODIUM 30 MG/0.3ML ~~LOC~~ SOLN
30.0000 mg | SUBCUTANEOUS | Status: DC
  Administered 2015-01-02: 30 mg via SUBCUTANEOUS
  Filled 2015-01-02 (×2): qty 0.3

## 2015-01-02 MED ORDER — INSULIN ASPART 100 UNIT/ML ~~LOC~~ SOLN
6.0000 [IU] | Freq: Three times a day (TID) | SUBCUTANEOUS | Status: DC
  Administered 2015-01-03: 6 [IU] via SUBCUTANEOUS

## 2015-01-02 MED ORDER — INSULIN GLARGINE 100 UNIT/ML ~~LOC~~ SOLN
20.0000 [IU] | Freq: Every day | SUBCUTANEOUS | Status: DC
  Filled 2015-01-02: qty 0.2

## 2015-01-02 MED ORDER — INSULIN GLARGINE 100 UNIT/ML ~~LOC~~ SOLN
30.0000 [IU] | Freq: Every day | SUBCUTANEOUS | Status: DC
  Administered 2015-01-02: 30 [IU] via SUBCUTANEOUS
  Filled 2015-01-02: qty 0.3

## 2015-01-02 MED ORDER — DIAZEPAM 5 MG PO TABS
10.0000 mg | ORAL_TABLET | Freq: Three times a day (TID) | ORAL | Status: DC
  Administered 2015-01-02 – 2015-01-03 (×3): 10 mg via ORAL
  Filled 2015-01-02 (×3): qty 2

## 2015-01-02 NOTE — Progress Notes (Signed)
Subjective: Breathing is improved today, appetite is excellent  Objective: Vital signs in last 24 hours: Temp:  [97.7 F (36.5 C)-98.8 F (37.1 C)] 97.9 F (36.6 C) (02/23 0650) Pulse Rate:  [52-95] 85 (02/23 0650) Resp:  [18-33] 28 (02/23 0650) BP: (92-133)/(45-82) 126/71 mmHg (02/23 0556) SpO2:  [91 %-98 %] 92 % (02/23 0835) Weight:  [102.286 kg (225 lb 8 oz)-102.9 kg (226 lb 13.7 oz)] 102.286 kg (225 lb 8 oz) (02/22 1215) Weight change: -1.427 kg (-3 lb 2.4 oz)   Intake/Output from previous day: 02/22 0701 - 02/23 0700 In: 2490 [P.O.:240; I.V.:1750; IV Piggyback:500] Out: 400 [Urine:400]   General appearance: alert, cooperative and no distress Resp: clear to auscultation bilaterally and anteriorly Cardio: regular rate and rhythm, S1, S2 normal, no murmur, click, rub or gallop GI: soft, non-tender; bowel sounds normal; no masses,  no organomegaly Extremities: extremities normal, atraumatic, no cyanosis or edema  Lab Results:  Recent Labs  01/01/15 0453 01/01/15 0508 01/02/15 0135  WBC 21.9*  --  17.1*  HGB 15.2 17.0 12.2*  HCT 45.6 50.0 38.9*  PLT 658*  --  623*   BMET  Recent Labs  01/01/15 0453 01/01/15 0508 01/01/15 0944 01/01/15 2320 01/02/15 0135  NA 135 134*  --   --  134*  K 5.3* 5.3*  --   --  4.9  CL 98 101  --   --  106  CO2 24  --   --   --  24  GLUCOSE 204* 212*  --  395* 318*  BUN 44* 41*  --   --  25*  CREATININE 1.72* 1.60* 1.22  --  1.23  CALCIUM 8.6  --   --   --  7.7*   CMET CMP     Component Value Date/Time   NA 134* 01/02/2015 0135   K 4.9 01/02/2015 0135   CL 106 01/02/2015 0135   CO2 24 01/02/2015 0135   GLUCOSE 318* 01/02/2015 0135   BUN 25* 01/02/2015 0135   CREATININE 1.23 01/02/2015 0135   CALCIUM 7.7* 01/02/2015 0135   PROT 6.1 01/02/2015 0135   ALBUMIN 2.2* 01/02/2015 0135   AST 14 01/02/2015 0135   ALT 18 01/02/2015 0135   ALKPHOS 79 01/02/2015 0135   BILITOT 0.4 01/02/2015 0135   GFRNONAA 60* 01/02/2015 0135    GFRAA 70* 01/02/2015 0135    CBG (last 3)   Recent Labs  01/01/15 1648 01/01/15 2217 01/02/15 0752  GLUCAP 219* 408* 203*    INR RESULTS:   Lab Results  Component Value Date   INR 0.96 12/26/2012     Studies/Results: Ct Abdomen Pelvis Wo Contrast  01/01/2015   CLINICAL DATA:  Right-sided abdominal pain. Elevated white blood cell count.  EXAM: CT ABDOMEN AND PELVIS WITHOUT CONTRAST  TECHNIQUE: Multidetector CT imaging of the abdomen and pelvis was performed following the standard protocol without IV contrast.  COMPARISON:  Contrast-enhanced exam 12/25/2012  FINDINGS: Moderate right pleural effusion, partially loculated, measures at least 4.4 cm in depth. There are ill-defined ground-glass nodular opacities in the medial right lower lobe, partially included. No left pleural effusion.  The unenhanced liver, spleen, gallbladder, pancreas, and adrenal glands are normal. There is mild perinephric stranding, nonspecific in unchanged from prior exam. No hydronephrosis or renal stone.  Stomach is decompressed. The previous inflammatory change with the duodenum has resolved. There are no dilated or thickened bowel loops. Moderate volume of colonic stool. There are multiple colonic diverticula in the distal  colon without diverticulitis. The appendix is normal. No free air, free fluid, or intra-abdominal fluid collection.  Abdominal aorta is normal in caliber with mild atherosclerosis. There is a fat containing umbilical hernia. No retroperitoneal adenopathy.  Within the pelvis the urinary bladder is physiologically distended. Prostate gland is normal in size. No pelvic free fluid. No pelvic adenopathy. No lytic or blastic osseous lesions. There is degenerative change in the lumbar spine.  IMPRESSION: 1. No acute abnormality in the abdomen/pelvis. 2. Partially loculated right pleural effusion. There are ground-glass and nodular opacities in the right lower lobe. These are nonspecific. This may be  infectious or inflammatory, however neoplasm not excluded. This is only partially included in the field of view. Dedicated follow-up chest CT could be considered for complete evaluation. Continued radiographic follow-up is recommended. 3. Diverticulosis without diverticulitis.   Electronically Signed   By: Jeb Levering M.D.   On: 01/01/2015 06:13   Dg Chest 1 View  01/01/2015   CLINICAL DATA:  Status post right-sided thoracentesis  EXAM: CHEST  1 VIEW  COMPARISON:  Study obtained earlier in the day  FINDINGS: There is no demonstrable pneumothorax. Right effusion is smaller post thoracentesis. There is still a small right effusion with patchy consolidation in the right base. There is atelectatic change in the right mid lung. Left lung is clear. Heart size and pulmonary vascularity are normal. No adenopathy.  IMPRESSION: Right effusion smaller post thoracentesis. No pneumothorax. Patchy consolidation right base. Mild atelectasis right midlung. Left lung clear.   Electronically Signed   By: Lowella Grip III M.D.   On: 01/01/2015 16:21   US Abdomen Limited  01/01/2015   CLINICAL DATA:  Right upper quadrant pain for 2 weeks.  EXAM: US ABDOMEN LIMITED - RIGHT UPPER QUADRANT  COMPARISON:  CT abdomen and pelvis earlier this same day.  FINDINGS: Gallbladder:  No gallstones or wall thickening visualized. No sonographic Murphy sign noted.  Common bile duct:  Diameter: 0.7 cm  Liver:  There is increased echogenicity consistent with diffuse fatty infiltration. No focal liver lesion or intrahepatic biliary ductal dilatation. Right pleural effusion is partially visualized.  IMPRESSION: Negative for gallstones or acute abnormality.  Diffuse fatty infiltration of the liver.  Right pleural effusion.   Electronically Signed   By: Inge Rise M.D.   On: 01/01/2015 07:15   Dg Chest Port 1 View  01/01/2015   CLINICAL DATA:  Shortness of breath.  Nausea and vomiting.  EXAM: PORTABLE CHEST - 1 VIEW  COMPARISON:   08/26/2014  FINDINGS: There is ill-defined patchy opacity at the right lung base with volume loss. There is a right pleural effusion and fluid in the right minor fissure. Left lung is clear. The heart size is normal. There is no pneumothorax. Pulmonary vasculature is normal. No acute osseous abnormality is seen.  IMPRESSION: Right basilar consolidation with volume loss, may reflect pneumonia versus atelectasis. There is a right pleural effusion with fluid in the right minor fissure.   Electronically Signed   By: Jeb Levering M.D.   On: 01/01/2015 05:57   US Thoracentesis Asp Pleural Space W/img Guide  01/01/2015   INDICATION: Symptomatic right sided pleural effusion  EXAM: US THORACENTESIS ASP PLEURAL SPACE W/IMG GUIDE  COMPARISON:  None.  MEDICATIONS: None  COMPLICATIONS: None immediate  TECHNIQUE: Informed written consent was obtained from the patient after a discussion of the risks, benefits and alternatives to treatment. A timeout was performed prior to the initiation of the procedure.  Initial ultrasound  scanning demonstrates a right pleural effusion. The lower chest was prepped and draped in the usual sterile fashion. 1% lidocaine was used for local anesthesia.  Under direct ultrasound guidance, a 19 gauge, 10-cm, Yueh catheter was introduced. An ultrasound image was saved for documentation purposes. The thoracentesis was performed. The catheter was removed and a dressing was applied. The patient tolerated the procedure well without immediate post procedural complication. The patient was escorted to have an upright chest radiograph.  FINDINGS: A total of approximately 353ml of serous fluid was removed. Requested samples were sent to the laboratory.  IMPRESSION: Successful ultrasound-guided right sided thoracentesis yielding 362ml of pleural fluid.  Read By:  Tsosie Billing PA-C   Electronically Signed   By: Lucrezia Europe M.D.   On: 01/01/2015 15:51    Medications: I have reviewed the patient's current  medications.  Assessment/Plan: #1 Pneumonia: improved sxs and WBC count, despite a dose of prednisone due to severe COPD, Will recheck labs tomorrow, possible discharge in 1-2 days #2 COPD: improved with prednisone and nebs #3 DM2: sugars high and will increase insulin rx  LOS: 1 day   Milferd Ansell G 01/02/2015, 8:40 AM

## 2015-01-02 NOTE — Progress Notes (Signed)
Patient refusing cont pulse ox. Will spot check O2 sats with vitals, as ordered.

## 2015-01-02 NOTE — Progress Notes (Signed)
CRITICAL VALUE ALERT  Critical value received:  CBG 428   Date of notification:  01/02/15  Time of notification:  5051  Critical value read back:yes   Nurse who received alert:  Erich Montane, RN  MD notified (1st page):  Dr. Brigitte Pulse   Time of first page:  1720   MD notified (2nd page):  Time of second page:  Responding MD:  Dr. Brigitte Pulse   Time MD responded:  1726   Pt aysmptomatic. Pt given total of 24 unit SQ Novolog per orders. Will recheck cbg in one hour. MD is aware. Will continue to monitor the pt.

## 2015-01-02 NOTE — Progress Notes (Addendum)
PULMONARY / CRITICAL CARE MEDICINE   Name: Joseph Manning MRN: [id number] DOB: [birth year]    ADMISSION DATE:  01/01/2015 CONSULTATION DATE:  01/01/2015   REFERRING MD :  Dr Kathrynn Humble of ER  CHIEF COMPLAINT:  Sepsis with lactic acidosis and right pleural effusion  BRIEF 64 year old male, smoker,hxof duodenitis and copd nos. Disabled due to knee and back issues.  Presented to [facility] ER with 6 day hx of new cough with white sputum,subjective fevers and right upper quadrant pain. Symptoms are progressive and severe. They're associated with intermittent chills and dry ears but no subjective fever per se. There is no hemoptysis or colored sputum. There is no associated chest pain or edema. There is no specific aggravating or relieving factors. In the emergency department he was monitorred and found to have mild lactic acidosis associated with hypotension that relieved with fluids associated with acute kidney insufficiency that also improved with fluids. The lactic acidosis resolved with fluid therapy. Investigation revealed a right-sided loculated pleural effusion on the CT scan abdomen lung cut. Internal medicine is admitting patient but pulmonary has been asked to evaluate for the effusion   EVENTS 01/01/2015 - 360cc serous fluid thoracentesis on right, exudate with LDH 485 but ok glucose 219 and lymphocytic 75%   SUBJECTIVE/OVERNIGHT/INTERVAL HX 01/02/2015: exudative lymphocytic effusion  + on thora yesterday. Cytology pending Afebrile but with improving WBC. Resolved renal failure, Urine strep negative   VITAL SIGNS: Temp:  [97.7 F (36.5 C)-98.8 F (37.1 C)] 97.9 F (36.6 C) (02/23 0650) Pulse Rate:  [52-95] 85 (02/23 0650) Resp:  [18-33] 28 (02/23 0650) BP: (92-133)/(45-82) 126/71 mmHg (02/23 0556) SpO2:  [91 %-98 %] 92 % (02/23 0809) Weight:  [225 lb 8 oz (102.286 kg)-226 lb 13.7 oz (102.9 kg)] 225 lb 8 oz (102.286 kg) (02/22 1215) HEMODYNAMICS:   VENTILATOR SETTINGS:   INTAKE  / OUTPUT:  Intake/Output Summary (Last 24 hours) at 01/02/15 0160 Last data filed at 01/02/15 0700  Gross per 24 hour  Intake   2490 ml  Output    400 ml  Net   2090 ml    PHYSICAL EXAMINATION: General:  Obese male, still lying in bed, poor historian, mildly disheveled, in no distress Neuro:  Alert and oriented 3.  hard of hearing HEENT:  Supple neck, but a class III Cardiovascular:  Normotensive. Normal cardiac. Normal heart sounds Lungs:  Barrel chested, NO  expiratory wheeze present bilaterally. No respiratory distress Abdomen:  Obese soft and tender  Musculoskeletal:  No cyanosis no clubbing no edema Skin:  Intact anteriorly  LABS:  PULMONARY  Recent Labs Lab 01/01/15 0508 01/01/15 0510  HCO3  --  25.3*  TCO2 21 22.9  O2SAT  --  29.7    CBC  Recent Labs Lab 01/01/15 0453 01/01/15 0508 01/02/15 0135  HGB 15.2 17.0 12.2*  HCT 45.6 50.0 38.9*  WBC 21.9*  --  17.1*  PLT 658*  --  623*    COAGULATION No results for input(s): INR in the last 168 hours.  CARDIAC    Recent Labs Lab 01/01/15 0944 01/01/15 1740 01/02/15 0135  TROPONINI <0.03 <0.03 <0.03   No results for input(s): PROBNP in the last 168 hours.   CHEMISTRY  Recent Labs Lab 01/01/15 0453 01/01/15 0508 01/01/15 0944 01/01/15 2320 01/02/15 0135  NA 135 134*  --   --  134*  K 5.3* 5.3*  --   --  4.9  CL 98 101  --   --  106  CO2 24  --   --   --  24  GLUCOSE 204* 212*  --  395* 318*  BUN 44* 41*  --   --  25*  CREATININE 1.72* 1.60* 1.22  --  1.23  CALCIUM 8.6  --   --   --  7.7*  MG  --   --   --   --  2.3  PHOS  --   --   --   --  3.2   Estimated Creatinine Clearance: 70.4 mL/min (by C-G formula based on Cr of 1.23).   LIVER  Recent Labs Lab 01/01/15 0453 01/01/15 0944 01/02/15 0135  AST 25  --  14  ALT 25  --  18  ALKPHOS 102  --  79  BILITOT 0.5  --  0.4  PROT 7.6  --  6.1  ALBUMIN 2.6* 2.1* 2.2*     INFECTIOUS  Recent Labs Lab 01/01/15 0515  01/01/15 0820  LATICACIDVEN 2.75* 0.87   No results for input(s): PROCALCITON in the last 168 hours.  ENDOCRINE CBG (last 3)   Recent Labs  01/01/15 1648 01/01/15 2217 01/02/15 0752  GLUCAP 219* 408* 203*         IMAGING x48h Ct Abdomen Pelvis Wo Contrast  01/01/2015   CLINICAL DATA:  Right-sided abdominal pain. Elevated white blood cell count.  EXAM: CT ABDOMEN AND PELVIS WITHOUT CONTRAST  TECHNIQUE: Multidetector CT imaging of the abdomen and pelvis was performed following the standard protocol without IV contrast.  COMPARISON:  Contrast-enhanced exam 12/25/2012  FINDINGS: Moderate right pleural effusion, partially loculated, measures at least 4.4 cm in depth. There are ill-defined ground-glass nodular opacities in the medial right lower lobe, partially included. No left pleural effusion.  The unenhanced liver, spleen, gallbladder, pancreas, and adrenal glands are normal. There is mild perinephric stranding, nonspecific in unchanged from prior exam. No hydronephrosis or renal stone.  Stomach is decompressed. The previous inflammatory change with the duodenum has resolved. There are no dilated or thickened bowel loops. Moderate volume of colonic stool. There are multiple colonic diverticula in the distal colon without diverticulitis. The appendix is normal. No free air, free fluid, or intra-abdominal fluid collection.  Abdominal aorta is normal in caliber with mild atherosclerosis. There is a fat containing umbilical hernia. No retroperitoneal adenopathy.  Within the pelvis the urinary bladder is physiologically distended. Prostate gland is normal in size. No pelvic free fluid. No pelvic adenopathy. No lytic or blastic osseous lesions. There is degenerative change in the lumbar spine.  IMPRESSION: 1. No acute abnormality in the abdomen/pelvis. 2. Partially loculated right pleural effusion. There are ground-glass and nodular opacities in the right lower lobe. These are nonspecific. This may  be infectious or inflammatory, however neoplasm not excluded. This is only partially included in the field of view. Dedicated follow-up chest CT could be considered for complete evaluation. Continued radiographic follow-up is recommended. 3. Diverticulosis without diverticulitis.   Electronically Signed   By: Jeb Levering M.D.   On: 01/01/2015 06:13   Dg Chest 1 View  01/01/2015   CLINICAL DATA:  Status post right-sided thoracentesis  EXAM: CHEST  1 VIEW  COMPARISON:  Study obtained earlier in the day  FINDINGS: There is no demonstrable pneumothorax. Right effusion is smaller post thoracentesis. There is still a small right effusion with patchy consolidation in the right base. There is atelectatic change in the right mid lung. Left lung is clear. Heart size and pulmonary vascularity are normal.  No adenopathy.  IMPRESSION: Right effusion smaller post thoracentesis. No pneumothorax. Patchy consolidation right base. Mild atelectasis right midlung. Left lung clear.   Electronically Signed   By: Lowella Grip III M.D.   On: 01/01/2015 16:21   US Abdomen Limited  01/01/2015   CLINICAL DATA:  Right upper quadrant pain for 2 weeks.  EXAM: US ABDOMEN LIMITED - RIGHT UPPER QUADRANT  COMPARISON:  CT abdomen and pelvis earlier this same day.  FINDINGS: Gallbladder:  No gallstones or wall thickening visualized. No sonographic Murphy sign noted.  Common bile duct:  Diameter: 0.7 cm  Liver:  There is increased echogenicity consistent with diffuse fatty infiltration. No focal liver lesion or intrahepatic biliary ductal dilatation. Right pleural effusion is partially visualized.  IMPRESSION: Negative for gallstones or acute abnormality.  Diffuse fatty infiltration of the liver.  Right pleural effusion.   Electronically Signed   By: Inge Rise M.D.   On: 01/01/2015 07:15   Dg Chest Port 1 View  01/01/2015   CLINICAL DATA:  Shortness of breath.  Nausea and vomiting.  EXAM: PORTABLE CHEST - 1 VIEW  COMPARISON:   08/26/2014  FINDINGS: There is ill-defined patchy opacity at the right lung base with volume loss. There is a right pleural effusion and fluid in the right minor fissure. Left lung is clear. The heart size is normal. There is no pneumothorax. Pulmonary vasculature is normal. No acute osseous abnormality is seen.  IMPRESSION: Right basilar consolidation with volume loss, may reflect pneumonia versus atelectasis. There is a right pleural effusion with fluid in the right minor fissure.   Electronically Signed   By: Jeb Levering M.D.   On: 01/01/2015 05:57   US Thoracentesis Asp Pleural Space W/img Guide  01/01/2015   INDICATION: Symptomatic right sided pleural effusion  EXAM: US THORACENTESIS ASP PLEURAL SPACE W/IMG GUIDE  COMPARISON:  None.  MEDICATIONS: None  COMPLICATIONS: None immediate  TECHNIQUE: Informed written consent was obtained from the patient after a discussion of the risks, benefits and alternatives to treatment. A timeout was performed prior to the initiation of the procedure.  Initial ultrasound scanning demonstrates a right pleural effusion. The lower chest was prepped and draped in the usual sterile fashion. 1% lidocaine was used for local anesthesia.  Under direct ultrasound guidance, a 19 gauge, 10-cm, Yueh catheter was introduced. An ultrasound image was saved for documentation purposes. The thoracentesis was performed. The catheter was removed and a dressing was applied. The patient tolerated the procedure well without immediate post procedural complication. The patient was escorted to have an upright chest radiograph.  FINDINGS: A total of approximately 332ml of serous fluid was removed. Requested samples were sent to the laboratory.  IMPRESSION: Successful ultrasound-guided right sided thoracentesis yielding 351ml of pleural fluid.  Read By:  Tsosie Billing PA-C   Electronically Signed   By: Lucrezia Europe M.D.   On: 01/01/2015 15:51        ASSESSMENT / PLAN:  PULMONARY  A:   Baseline  - Smoker with COPD not otherwise specified  Current  - Acute exacerbation because he is wheezing - Rright-sided loculated effusion as a result of possibly community acquired pneumonia - urine strep negative. C/w Parapneumonic effusion and not complicated  P:   Infectious diseases management per ID section for the pneumonia Aggressive bronchodilator therapy scheduled PO steroid starting 01/01/2015 - taper over 10-14 days Repeat CXr port Get CT scan of the chest postthoracentesis; will do at followup in a few weeks Resstart lovenox  OPD fu Dr Theda Sers pccm - CXR needed on day of followup - appt given to see 01/16/15 at 10.15am  CARDIOVASCULAR CVL  A: Occult septic shock due to high lactic acid and hypotension that resolved with fluid - resolved lactic acidosis. No MI  P:  Decrease fluid rate to kvp    RENAL A:  Acute kidney insufficiency improving with fluids due to sepsis; resolved 01/02/2015  P:   Monitor w   INFECTIOUS A:  Right loculated pleural effusion - not empyema, parapneumonic effusion  P:   Broad-spectrum antibiotics  Await urine Legionella,Nasal swab respiratory virus panel DC vanc Check PCT and depending on results taper to PO on 01/03/15    FAMILY  - Updates: Updated patient at the bedside - he is very hard of hearing. D/w Dr Sharlett Iles at bedside. PCCM will sign off     Dr. Brand Males, M.D., [facility]C.P Pulmonary and Critical Care Medicine Staff Physician [place] [facility] Pager: [phone], If no answer or between  15:00h - 7:00h: call [phone]  01/02/2015 8:28 AM   Future Appointments Date Time Provider [place]  01/16/2015 10:15 AM Tammy Jeralene Huff, NP [facility] None

## 2015-01-03 LAB — GLUCOSE, CAPILLARY
Glucose-Capillary: 176 mg/dL — ABNORMAL HIGH (ref 70–99)
Glucose-Capillary: 255 mg/dL — ABNORMAL HIGH (ref 70–99)

## 2015-01-03 LAB — HEMOGLOBIN A1C
HEMOGLOBIN A1C: 9.8 % — AB (ref 4.8–5.6)
Mean Plasma Glucose: 235 mg/dL

## 2015-01-03 LAB — MAGNESIUM: Magnesium: 2.2 mg/dL (ref 1.5–2.5)

## 2015-01-03 LAB — PHOSPHORUS: PHOSPHORUS: 3.3 mg/dL (ref 2.3–4.6)

## 2015-01-03 MED ORDER — IPRATROPIUM-ALBUTEROL 0.5-2.5 (3) MG/3ML IN SOLN: 3.0000 mL | RESPIRATORY_TRACT | Status: DC | PRN

## 2015-01-03 MED ORDER — DOXYCYCLINE HYCLATE 100 MG PO CAPS: 100.0000 mg | ORAL_CAPSULE | Freq: Two times a day (BID) | ORAL | Status: DC

## 2015-01-03 MED ORDER — LEVOFLOXACIN 500 MG PO TABS: 500.0000 mg | ORAL_TABLET | Freq: Every day | ORAL | Status: DC

## 2015-01-03 MED ORDER — GUAIFENESIN ER 600 MG PO TB12: 600.0000 mg | ORAL_TABLET | Freq: Two times a day (BID) | ORAL | Status: DC

## 2015-01-03 NOTE — Discharge Summary (Signed)
Physician Discharge Summary  Patient ID: Joseph Manning MRN: [id number] DOB/AGE: 1951/05/01 64 y.o.  Admit date: 01/01/2015 Discharge date: 01/03/2015   Discharge Diagnoses:  Principal Problem:   Community acquired pneumonia Active Problems:   Diabetes mellitus, type II   Right upper quadrant pain   COPD exacerbation   Smoker   Pneumonia   Pleural effusion, right   Sepsis   Acute kidney insufficiency   AKI (acute kidney injury)   CAP (community acquired pneumonia)   Hypotensive episode   Lactic acidosis   Loculated pleural effusion   Discharged Condition: good  Hospital Course: The patient is a 64 year old Caucasian man with multiple medical problems who presented to the emergency room with shortness of breath and right-sided pleuritic chest pain over the past few days. His also had a fairly dry cough without fever or chills. He is having shortness breath with any activity. He has a long history of moderate tobacco abuse and has diabetes mellitus, type II for which she intermittently does not take his insulin, also has a history of chronic pain syndrome for which she is on moderate dose narcotics regularly. On December 26, 2014 he called our office with complaints of shortness breath and right-sided chest pain as well as dizziness. He was advised to come to our office or go to the emergency room for evaluation. His past medical history is otherwise significant for severe duodenitis confirmed by endoscopy and treated with high-dose proton pump inhibitor treatment. Besides the cough and right-sided pleuritic chest pain is also had some right upper quadrant abdominal pain. He has been able to eat and drink well and he has not had problems with constipation or diarrhea. He also has a history of a moderate elevation of PSA level in the 15 range for which he has declined recheck of the PSA level or urologist evaluation despite discussions that this could represent prostate cancer.  He  was admitted to a medical bed with telemetry and did well showing no arrhythmia. He was followed closely by a pulmonary specialist who arrange for an ultrasound-guided right-sided thoracentesis. The pleural fluid was an exudate, but chest tube was not recommended as he was doing clinically well. He continued to improve with IV antibiotics, IV fluids, frequent nebulizers, and prednisone. By the day of discharge he was not having a productive cough or dyspnea on room air was eating and drinking well. His blood glucose levels had risen somewhat associated with prednisone treatment. He will be discharged to home today to complete a course of antibiotics by mouth (Levaquin and doxycycline) and duo nebs have been prescribed for him. He will have a follow-up visit in our office within 1 week of discharge his subsequent follow-up visit with his pulmonary specialist (Dr. Chase Caller). Procedures during his hospitalization included a CT scan of the abdomen and pelvis and an ultrasound-guided right-sided thoracentesis. There were no complications associated with this hospitalization.  Consults: pulmonary/intensive care  Significant Diagnostic Studies:  Dg Chest 1 View  01/01/2015   CLINICAL DATA:  Status post right-sided thoracentesis  EXAM: CHEST  1 VIEW  COMPARISON:  Study obtained earlier in the day  FINDINGS: There is no demonstrable pneumothorax. Right effusion is smaller post thoracentesis. There is still a small right effusion with patchy consolidation in the right base. There is atelectatic change in the right mid lung. Left lung is clear. Heart size and pulmonary vascularity are normal. No adenopathy.  IMPRESSION: Right effusion smaller post thoracentesis. No pneumothorax. Patchy consolidation right base. Mild  atelectasis right midlung. Left lung clear.   Electronically Signed   By: Lowella Grip III M.D.   On: 01/01/2015 16:21   Dg Chest Port 1 View  01/02/2015   CLINICAL DATA:  Right pleural effusion. Hx  of diabetes, hypertension, COPD, arthritis, anxiety. Current everyday smoker.  EXAM: PORTABLE CHEST - 1 VIEW  COMPARISON:  01/01/2015  FINDINGS: Stable right pleural effusion and patchy atelectasis or consolidation in the right lower lung. Left lung remains clear. Heart size upper limits normal for technique. No pneumothorax Visualized skeletal structures are unremarkable.  IMPRESSION: 1. Stable right pleural effusion and right lower lung consolidation/atelectasis.   Electronically Signed   By: Lucrezia Europe M.D.   On: 01/02/2015 10:09   US Thoracentesis Asp Pleural Space W/img Guide  01/01/2015   INDICATION: Symptomatic right sided pleural effusion  EXAM: US THORACENTESIS ASP PLEURAL SPACE W/IMG GUIDE  COMPARISON:  None.  MEDICATIONS: None  COMPLICATIONS: None immediate  TECHNIQUE: Informed written consent was obtained from the patient after a discussion of the risks, benefits and alternatives to treatment. A timeout was performed prior to the initiation of the procedure.  Initial ultrasound scanning demonstrates a right pleural effusion. The lower chest was prepped and draped in the usual sterile fashion. 1% lidocaine was used for local anesthesia.  Under direct ultrasound guidance, a 19 gauge, 10-cm, Yueh catheter was introduced. An ultrasound image was saved for documentation purposes. The thoracentesis was performed. The catheter was removed and a dressing was applied. The patient tolerated the procedure well without immediate post procedural complication. The patient was escorted to have an upright chest radiograph.  FINDINGS: A total of approximately 322ml of serous fluid was removed. Requested samples were sent to the laboratory.  IMPRESSION: Successful ultrasound-guided right sided thoracentesis yielding 363ml of pleural fluid.  Read By:  Tsosie Billing PA-C   Electronically Signed   By: Lucrezia Europe M.D.   On: 01/01/2015 15:51    Labs: Lab Results  Component Value Date   WBC 17.1* 01/02/2015   HGB 12.2*  01/02/2015   HCT 38.9* 01/02/2015   MCV 98.5 01/02/2015   PLT 623* 01/02/2015     Recent Labs Lab 01/02/15 0135  NA 134*  K 4.9  CL 106  CO2 24  BUN 25*  CREATININE 1.23  CALCIUM 7.7*  PROT 6.1  BILITOT 0.4  ALKPHOS 79  ALT 18  AST 14  GLUCOSE 318*       Lab Results  Component Value Date   INR 0.96 12/26/2012     Recent Results (from the past 240 hour(s))  Blood Culture (routine x 2)     Status: None (Preliminary result)   Collection Time: 01/01/15  5:16 AM  Result Value Ref Range Status   Specimen Description BLOOD LEFT AXILLA  Final   Special Requests BOTTLES DRAWN AEROBIC AND ANAEROBIC 4CC  Final   Culture   Final           BLOOD CULTURE RECEIVED NO GROWTH TO DATE CULTURE WILL BE HELD FOR 5 DAYS BEFORE ISSUING A FINAL NEGATIVE REPORT Note: Culture results may be compromised due to an inadequate volume of blood received in culture bottles. Performed at [facility]    Report Status PENDING  Incomplete  Blood Culture (routine x 2)     Status: None (Preliminary result)   Collection Time: 01/01/15  5:16 AM  Result Value Ref Range Status   Specimen Description BLOOD LEFT ANTECUBITAL  Final   Special Requests  BOTTLES DRAWN AEROBIC AND ANAEROBIC 4CC  Final   Culture   Final           BLOOD CULTURE RECEIVED NO GROWTH TO DATE CULTURE WILL BE HELD FOR 5 DAYS BEFORE ISSUING A FINAL NEGATIVE REPORT Performed at [facility]    Report Status PENDING  Incomplete  Urine culture     Status: None   Collection Time: 01/01/15  7:58 AM  Result Value Ref Range Status   Specimen Description URINE, CLEAN CATCH  Final   Special Requests NONE  Final   Colony Count NO GROWTH Performed at [facility]   Final   Culture NO GROWTH Performed at [facility]   Final   Report Status 01/02/2015 FINAL  Final  MRSA PCR Screening     Status: None   Collection Time: 01/01/15  9:56 AM  Result Value Ref Range Status   MRSA by PCR NEGATIVE NEGATIVE Final     Comment:        The GeneXpert MRSA Assay (FDA approved for NASAL specimens only), is one component of a comprehensive MRSA colonization surveillance program. It is not intended to diagnose MRSA infection nor to guide or monitor treatment for MRSA infections.   Respiratory virus panel (routine influenza)     Status: Abnormal   Collection Time: 01/01/15 10:05 AM  Result Value Ref Range Status   Source - RVPAN NASOPHARYNGEAL SWAB  Corrected   Respiratory Syncytial Virus A Negative Negative Final   Respiratory Syncytial Virus B Negative Negative Final   Influenza A Negative Negative Final   Influenza B Negative Negative Final   Parainfluenza 1 Negative Negative Final   Parainfluenza 2 Negative Negative Final   Parainfluenza 3 Negative Negative Final   Metapneumovirus Negative Negative Final   Rhinovirus Positive (A) Negative Final   Adenovirus Negative Negative Final    Comment: (NOTE) Performed At: [facility] [place], [place] [id number] Lindon Romp MD JQ:[id number]   Body fluid culture     Status: None (Preliminary result)   Collection Time: 01/01/15  3:51 PM  Result Value Ref Range Status   Specimen Description PLEURAL  Final   Special Requests NONE  Final   Gram Stain   Final    RARE WBC PRESENT,BOTH PMN AND MONONUCLEAR NO ORGANISMS SEEN Performed at [facility]    Culture   Final    NO GROWTH 1 DAY Performed at [facility]    Report Status PENDING  Incomplete      Discharge Exam: Blood pressure 131/64, pulse 56, temperature 98.4 F (36.9 C), temperature source Oral, resp. rate 18, height 5\' 8"  (1.727 m), weight 102.286 kg (225 lb 8 oz), SpO2 93 %.  Physical Exam: In general, he is an overweight white man who was in no apparent distress while sitting partially upright in bed. HEENT exam was normal. Chest had decreased breath sounds in the right base and no significant or rhonchi. Heart had a regular rate and  rhythm without significant murmur or gallop, abdomen had normal bowel sounds no tenderness, extremities were without cyanosis, clubbing, or edema. He was alert and well oriented with moderately decreased hearing. There were no focal neurologic deficits.  Disposition: He will be discharged from the hospital today to return home.  Discharge Instructions    Call MD for:    Complete by:  As directed   Call for fever, chills, worsening breathing, blood sugars consistently over 350, or any other  concerning symptoms     Diet - low sodium heart healthy    Complete by:  As directed      Discharge instructions    Complete by:  As directed   Remember to take the antibiotics prescribed. Stop all smoking, can use nicotine patches to stop the urge to smoke     Increase activity slowly    Complete by:  As directed             Medication List    STOP taking these medications        nicotine 21 mg/24hr patch  Commonly known as:  NICODERM CQ - dosed in mg/24 hours     predniSONE 20 MG tablet  Commonly known as:  DELTASONE     tiotropium 18 MCG inhalation capsule  Commonly known as:  SPIRIVA      TAKE these medications        albuterol 108 (90 BASE) MCG/ACT inhaler  Commonly known as:  PROVENTIL HFA;VENTOLIN HFA  Inhale 2 puffs into the lungs every 6 (six) hours as needed for wheezing or shortness of breath.     diazepam 10 MG tablet  Commonly known as:  VALIUM  Take 10 mg by mouth 3 (three) times daily.     doxycycline 100 MG capsule  Commonly known as:  VIBRAMYCIN  Take 1 capsule (100 mg total) by mouth 2 (two) times daily.     GOODY HEADACHE PO  Take 1 packet by mouth every 6 (six) hours as needed (for pain or headache).     guaiFENesin 600 MG 12 hr tablet  Commonly known as:  MUCINEX  Take 1 tablet (600 mg total) by mouth 2 (two) times daily.     HYDROcodone-acetaminophen 5-325 MG per tablet  Commonly known as:  NORCO/VICODIN  Take 1-2 tablets by mouth 3 (three) times daily.  He takes two tablets in the morning and evening and one tablet midday.     insulin NPH-regular Human (70-30) 100 UNIT/ML injection  Commonly known as:  NOVOLIN 70/30  50 units in am and 50 units just before supper every day     ipratropium-albuterol 0.5-2.5 (3) MG/3ML Soln  Commonly known as:  DUONEB  Take 3 mLs by nebulization every 4 (four) hours as needed.     levofloxacin 500 MG tablet  Commonly known as:  LEVAQUIN  Take 1 tablet (500 mg total) by mouth daily.     lisinopril 10 MG tablet  Commonly known as:  PRINIVIL,ZESTRIL  Take 10 mg by mouth daily.     pantoprazole 40 MG tablet  Commonly known as:  PROTONIX  Take 1 tablet (40 mg total) by mouth 2 (two) times daily.     SYMBICORT 160-4.5 MCG/ACT inhaler  Generic drug:  budesonide-formoterol  Inhale 1 puff into the lungs daily.           Follow-up Information    Follow up with [name], MD. Schedule an appointment as soon as possible for a visit in 1 week.   Specialty:  Internal Medicine   Contact information:   [place] [address] [phone]       Signed: [name] 01/03/2015, 8:10 AM

## 2015-01-03 NOTE — Progress Notes (Signed)
Unable to print discharge paperwork for patient.  Patient states he is ready to go home and will come back tomorrow for his discharge paperwork. Medications reviewed with patient and he verbalizes understanding.  Patient discharged home.

## 2015-01-05 LAB — BODY FLUID CULTURE: Culture: NO GROWTH

## 2015-01-07 LAB — CULTURE, BLOOD (ROUTINE X 2)
CULTURE: NO GROWTH
Culture: NO GROWTH

## 2015-01-10 LAB — OTHER BODY FLUID CHEMISTRY: Miscellaneous Test Results: 74

## 2015-01-12 LAB — OTHER BODY FLUID CHEMISTRY: Miscellaneous Test Results: 110

## 2015-01-16 ENCOUNTER — Ambulatory Visit (INDEPENDENT_AMBULATORY_CARE_PROVIDER_SITE_OTHER)
Admission: RE | Admit: 2015-01-16 | Discharge: 2015-01-16 | Disposition: A | Discharge status: Home / Self Care | Payer: Medicaid Other | Source: Ambulatory Visit | Attending: Adult Health | Admitting: Adult Health

## 2015-01-16 ENCOUNTER — Encounter: Payer: Self-pay | Admitting: Adult Health

## 2015-01-16 ENCOUNTER — Ambulatory Visit (INDEPENDENT_AMBULATORY_CARE_PROVIDER_SITE_OTHER): Payer: Medicaid Other | Admitting: Adult Health

## 2015-01-16 VITALS — BP 104/74 | HR 106 | Temp 97.6°F | Ht 68.0 in | Wt 220.0 lb

## 2015-01-16 DIAGNOSIS — J948 Other specified pleural conditions: Secondary | ICD-10-CM

## 2015-01-16 DIAGNOSIS — J90 Pleural effusion, not elsewhere classified: Secondary | ICD-10-CM

## 2015-01-16 DIAGNOSIS — J189 Pneumonia, unspecified organism: Secondary | ICD-10-CM

## 2015-01-16 DIAGNOSIS — J441 Chronic obstructive pulmonary disease with (acute) exacerbation: Secondary | ICD-10-CM

## 2015-01-16 NOTE — Patient Instructions (Signed)
Continue on DuoNeb 3 times daily Great job on cutting back on smoking. Continue to work on not smoking at all. We are setting, you up for a CT chest to follow-up pneumonia in 6 weeks right before your office visit with Dr. Chase Caller. Follow-up in 6 weeks with Dr. Chase Caller with a pulmonary function test Please contact office for sooner follow up if symptoms do not improve or worsen or seek emergency care

## 2015-01-16 NOTE — Progress Notes (Signed)
   Subjective:    Patient ID: Joseph Manning, male    DOB: 19-Jan-1951, 64 y.o.   MRN: [id number]  HPI  /01/16/2015 [facility] follow up  Patient returns for post hospital follow-up Patient was admitted February 22 2 February 24 for community-acquired pneumonia, COPD exacerbation, right pleural effusion, sepsis. Patient was seen for pulmonary consultation. CT chest showed a partially loculated right pleural effusion. Right lower lobe consolidation Patient was treated with aggressive IV antibiotics, nebulizers and steroids. He underwent a thoracentesis with 360 cc of fluid removed Cytology returned with reactive mesothelial cells, and lymphocytosis No malignant cells noted Chest x-ray today shows improved right lower lobe infiltrate and small right pleural effusion He is feeling better . No hemopytsis .  Taking Duoneb Three times a day  . Did not get Symbicort ? Afford medicine.  Recently taken off ACE d/t low b/p.    Review of Systems Constitutional:   No  weight loss, night sweats,  Fevers, chills,  +fatigue, or  lassitude.  HEENT:   No headaches,  Difficulty swallowing,  Tooth/dental problems, or  Sore throat,                No sneezing, itching, ear ache, nasal congestion, post nasal drip,   CV:  No chest pain,  Orthopnea, PND, swelling in lower extremities, anasarca, dizziness, palpitations, syncope.   GI  No heartburn, indigestion, abdominal pain, nausea, vomiting, diarrhea, change in bowel habits, loss of appetite, bloody stools.   Resp:  No chest wall deformity  Skin: no rash or lesions.  GU: no dysuria, change in color of urine, no urgency or frequency.  No flank pain, no hematuria   MS:  No joint pain or swelling.  No decreased range of motion.  No back pain.  Psych:  No change in mood or affect. No depression or anxiety.  No memory loss.         Objective:   Physical Exam GEN: A/Ox3; pleasant , NAD   HEENT:  [place]/AT,  EACs-clear, TMs-wnl, NOSE-clear,  THROAT-clear, no lesions, no postnasal drip or exudate noted. , poor dentition   NECK:  Supple w/ fair ROM; no JVD; normal carotid impulses w/o bruits; no thyromegaly or nodules palpated; no lymphadenopathy.  RESP  Decreased BS ,  w/o, wheezes/ rales/ or rhonchi.no accessory muscle use, no dullness to percussion  CARD:  RRR, no m/r/g  , no peripheral edema, pulses intact, no cyanosis or clubbing.  GI:   Soft & nt; nml bowel sounds; no organomegaly or masses detected.  Musco: Warm bil, no deformities or joint swelling noted.   Neuro: alert, no focal deficits noted.    Skin: Warm, no lesions or rashes   CXR 01/16/2015 improved Right sided infiltrates       Assessment & Plan:

## 2015-01-16 NOTE — Addendum Note (Signed)
Addended by: Levander Campion on: 01/16/2015 11:15 AM   Modules accepted: Orders, Medications

## 2015-01-16 NOTE — Assessment & Plan Note (Signed)
RLL CAP w/ pleural effusion s/p thoracenteisis, cytology neg for malignant cells  Clinically improved CXR improved , will need CT chest in 6 weeks   Plan  Continue on DuoNeb 3 times daily Great job on cutting back on smoking. Continue to work on not smoking at all. We are setting, you up for a CT chest to follow-up pneumonia in 6 weeks right before your office visit with Dr. Chase Caller. Follow-up in 6 weeks with Dr. Chase Caller with a pulmonary function test Please contact office for sooner follow up if symptoms do not improve or worsen or seek emergency care

## 2015-01-16 NOTE — Assessment & Plan Note (Signed)
Former heavy smoker, needs PFT  For now stay on Duoneb Cost will be an issue   Plan  Continue on DuoNeb 3 times daily Great job on cutting back on smoking. Continue to work on not smoking at all. We are setting, you up for a CT chest to follow-up pneumonia in 6 weeks right before your office visit with Dr. Chase Caller. Follow-up in 6 weeks with Dr. Chase Caller with a pulmonary function test Please contact office for sooner follow up if symptoms do not improve or worsen or seek emergency care

## 2015-03-01 ENCOUNTER — Ambulatory Visit (INDEPENDENT_AMBULATORY_CARE_PROVIDER_SITE_OTHER)
Admission: RE | Admit: 2015-03-01 | Discharge: 2015-03-01 | Disposition: A | Discharge status: Home / Self Care | Payer: Medicaid Other | Source: Ambulatory Visit | Attending: Internal Medicine | Admitting: Internal Medicine

## 2015-03-01 DIAGNOSIS — J441 Chronic obstructive pulmonary disease with (acute) exacerbation: Secondary | ICD-10-CM | POA: Diagnosis not present

## 2015-03-01 DIAGNOSIS — J948 Other specified pleural conditions: Secondary | ICD-10-CM | POA: Diagnosis not present

## 2015-03-01 DIAGNOSIS — J90 Pleural effusion, not elsewhere classified: Secondary | ICD-10-CM

## 2015-03-05 ENCOUNTER — Ambulatory Visit: Payer: Medicaid Other | Admitting: Internal Medicine

## 2015-05-01 ENCOUNTER — Ambulatory Visit (INDEPENDENT_AMBULATORY_CARE_PROVIDER_SITE_OTHER): Payer: Medicaid Other | Admitting: Internal Medicine

## 2015-05-01 ENCOUNTER — Encounter: Payer: Self-pay | Admitting: Internal Medicine

## 2015-05-01 VITALS — BP 108/60 | HR 106 | Ht 69.5 in | Wt 240.0 lb

## 2015-05-01 DIAGNOSIS — J189 Pneumonia, unspecified organism: Secondary | ICD-10-CM | POA: Diagnosis not present

## 2015-05-01 DIAGNOSIS — Z720 Tobacco use: Secondary | ICD-10-CM

## 2015-05-01 DIAGNOSIS — J449 Chronic obstructive pulmonary disease, unspecified: Secondary | ICD-10-CM

## 2015-05-01 DIAGNOSIS — F17200 Nicotine dependence, unspecified, uncomplicated: Secondary | ICD-10-CM | POA: Insufficient documentation

## 2015-05-01 DIAGNOSIS — IMO0001 Reserved for inherently not codable concepts without codable children: Secondary | ICD-10-CM | POA: Insufficient documentation

## 2015-05-01 DIAGNOSIS — J948 Other specified pleural conditions: Secondary | ICD-10-CM | POA: Diagnosis not present

## 2015-05-01 DIAGNOSIS — J90 Pleural effusion, not elsewhere classified: Secondary | ICD-10-CM

## 2015-05-01 DIAGNOSIS — J441 Chronic obstructive pulmonary disease with (acute) exacerbation: Secondary | ICD-10-CM

## 2015-05-01 LAB — PULMONARY FUNCTION TEST
DL/VA % PRED: 114 %
DL/VA: 5.23 ml/min/mmHg/L
DLCO unc % pred: 89 %
DLCO unc: 28.28 ml/min/mmHg
FEF 25-75 PRE: 1.29 L/s
FEF 25-75 Post: 1.45 L/sec
FEF2575-%Change-Post: 12 %
FEF2575-%PRED-POST: 54 %
FEF2575-%Pred-Pre: 48 %
FEV1-%CHANGE-POST: 6 %
FEV1-%Pred-Post: 59 %
FEV1-%Pred-Pre: 55 %
FEV1-Post: 2 L
FEV1-Pre: 1.88 L
FEV1FVC-%Change-Post: 5 %
FEV1FVC-%PRED-PRE: 91 %
FEV6-%CHANGE-POST: 1 %
FEV6-%Pred-Post: 64 %
FEV6-%Pred-Pre: 63 %
FEV6-Post: 2.76 L
FEV6-Pre: 2.73 L
FEV6FVC-%CHANGE-POST: 0 %
FEV6FVC-%PRED-PRE: 104 %
FEV6FVC-%Pred-Post: 105 %
FVC-%Change-Post: 0 %
FVC-%PRED-PRE: 60 %
FVC-%Pred-Post: 61 %
FVC-Post: 2.76 L
FVC-Pre: 2.74 L
POST FEV6/FVC RATIO: 100 %
Post FEV1/FVC ratio: 72 %
Pre FEV1/FVC ratio: 69 %
Pre FEV6/FVC Ratio: 100 %

## 2015-05-01 MED ORDER — ALBUTEROL SULFATE HFA 108 (90 BASE) MCG/ACT IN AERS: 2.0000 | INHALATION_SPRAY | Freq: Four times a day (QID) | RESPIRATORY_TRACT | Status: AC | PRN

## 2015-05-01 NOTE — Patient Instructions (Addendum)
ICD-9-CM ICD-10-CM   1. COPD, moderate 496 J44.9   2. CAP (community acquired pneumonia) 63 J18.9   3. Smoking 305.1 Z72.0    Copd - stable Pneumonia - cleared up Smoking - still active byut improved significantly  PLAN Continue on DuoNeb 3 times daily Do albuterol 2 puff as needed - nurse will do refill Great job on cutting back on smoking. Continue to work on not smoking at all.  visit with Dr. Chase Caller. Flu shot in fall  Followup  6 months or sooner if needed

## 2015-05-01 NOTE — Progress Notes (Signed)
PFT done today. 

## 2015-05-01 NOTE — Progress Notes (Signed)
Subjective:    Patient ID: Joseph Manning, male    DOB: 02/17/1951, 64 y.o.   MRN: [id number]  HPI    /01/16/2015 [facility] follow up  Patient returns for post hospital follow-up Patient was admitted February 22 2 February 24 for community-acquired pneumonia, COPD exacerbation, right pleural effusion, sepsis. Patient was seen for pulmonary consultation. CT chest showed a partially loculated right pleural effusion. Right lower lobe consolidation Patient was treated with aggressive IV antibiotics, nebulizers and steroids. He underwent a thoracentesis with 360 cc of fluid removed Cytology returned with reactive mesothelial cells, and lymphocytosis No malignant cells noted Chest x-ray today shows improved right lower lobe infiltrate and small right pleural effusion He is feeling better . No hemopytsis .  Taking Duoneb Three times a day  . Did not get Symbicort ? Afford medicine.  Recently taken off ACE d/t low b/p.    OV 05/01/2015  Chief Complaint  Patient presents with  . Follow-up    Here to discuss PFT results. States that breathing is doing well. Denies any breating problems during visit     Here for followup  Smoking : he has cut down a lot  COPD:  Pulmonary function test today 05/01/2015 postbronchodilator FEV1 2.0 L/59%, ratio 72 and consistent with obstruction. DLCO of 28.3/89%. No broncho-dilator response. This is gold stage 2 copd. He feels good on duoneb. Does not want to change this regimen. Not interested in talking about vaccines; wants to leave  Recent pneunomonia: had followup CT. Personally visualized image and agree with report beow CT chst 03/01/15 IMPRESSION: No acute pulmonary findings, worrisome pulmonary lesions or pleural Effusion. No mediastinal or hilar mass or adenopathy.  Electronically Signed  By: Marijo Sanes M.D.  On: 03/01/2015 16:30   reports that he has been smoking Cigarettes.  He has a 50 pack-year smoking history. He has never  used smokeless tobacco.    Current outpatient prescriptions:  .  albuterol (PROVENTIL HFA;VENTOLIN HFA) 108 (90 BASE) MCG/ACT inhaler, Inhale 2 puffs into the lungs every 6 (six) hours as needed for wheezing or shortness of breath., Disp: 1 Inhaler, Rfl: 2 .  Aspirin-Acetaminophen-Caffeine (GOODY HEADACHE PO), Take 1 packet by mouth every 6 (six) hours as needed (for pain or headache). , Disp: , Rfl:  .  diazepam (VALIUM) 10 MG tablet, Take 10 mg by mouth 3 (three) times daily. , Disp: , Rfl:  .  guaiFENesin (MUCINEX) 600 MG 12 hr tablet, Take 1 tablet (600 mg total) by mouth 2 (two) times daily., Disp: 60 tablet, Rfl: 1 .  HYDROcodone-acetaminophen (NORCO/VICODIN) 5-325 MG per tablet, Take 1-2 tablets by mouth 3 (three) times daily. He takes two tablets in the morning and evening and one tablet midday., Disp: , Rfl: 0 .  insulin NPH-regular Human (NOVOLIN 70/30) (70-30) 100 UNIT/ML injection, 50 units in am and 50 units just before supper every day (Patient taking differently: 40-45 Units 2 (two) times daily with a meal. He takes 40 units in the morning and 45 units at night.), Disp: 20 mL, Rfl: 11 .  ipratropium-albuterol (DUONEB) 0.5-2.5 (3) MG/3ML SOLN, Take 3 mLs by nebulization 3 (three) times daily., Disp: , Rfl:  .  pantoprazole (PROTONIX) 40 MG tablet, Take 1 tablet (40 mg total) by mouth 2 (two) times daily., Disp: 60 tablet, Rfl: 3   Review of Systems  Constitutional: Negative for fever, chills, activity change, appetite change and unexpected weight change.  HENT: Negative for congestion, dental problem, postnasal drip, rhinorrhea,  sneezing, sore throat, trouble swallowing and voice change.   Eyes: Negative for visual disturbance.  Respiratory: Negative for cough, choking and shortness of breath.   Cardiovascular: Negative for chest pain and leg swelling.  Gastrointestinal: Negative for nausea, vomiting and abdominal pain.  Genitourinary: Negative for difficulty urinating.    Musculoskeletal: Negative for arthralgias.  Skin: Negative for rash.  Psychiatric/Behavioral: Negative for behavioral problems and confusion.       Objective:   Physical Exam  Constitutional: He is oriented to person, place, and time. He appears well-developed and well-nourished. No distress.  Body mass index is 34.95 kg/(m^2).   HENT:  Head: Normocephalic and atraumatic.  Right Ear: External ear normal.  Left Ear: External ear normal.  Mouth/Throat: Oropharynx is clear and moist. No oropharyngeal exudate.  HOH Smells of tobacco  Eyes: Conjunctivae and EOM are normal. Pupils are equal, round, and reactive to light. Right eye exhibits no discharge. Left eye exhibits no discharge. No scleral icterus.  Neck: Normal range of motion. Neck supple. No JVD present. No tracheal deviation present. No thyromegaly present.  Cardiovascular: Normal rate, regular rhythm and intact distal pulses.  Exam reveals no gallop and no friction rub.   No murmur heard. Pulmonary/Chest: Effort normal and breath sounds normal. No respiratory distress. He has no wheezes. He has no rales. He exhibits no tenderness.  barel chest  Abdominal: Soft. Bowel sounds are normal. He exhibits no distension and no mass. There is no tenderness. There is no rebound and no guarding.  Musculoskeletal: Normal range of motion. He exhibits no edema or tenderness.  Lymphadenopathy:    He has no cervical adenopathy.  Neurological: He is alert and oriented to person, place, and time. He has normal reflexes. No cranial nerve deficit. Coordination normal.  Skin: Skin is warm and dry. No rash noted. He is not diaphoretic. No erythema. No pallor.  Psychiatric: He has a normal mood and affect. His behavior is normal. Judgment and thought content normal.  Nursing note and vitals reviewed.   Filed Vitals:   05/01/15 1633  BP: 108/60  Pulse: 106  Height: 5' 9.5" (1.765 m)  Weight: 240 lb (108.863 kg)  SpO2: 93%          Assessment & Plan:     ICD-9-CM ICD-10-CM   1. COPD, moderate 496 J44.9   2. CAP (community acquired pneumonia) 75 J18.9   3. Smoking 305.1 Z72.0     Copd - stable Pneumonia - cleared up Smoking - still active byut improved significantly  PLAN Continue on DuoNeb 3 times daily Do albuterol 2 puff as needed - nurse will do refill Great job on cutting back on smoking. Continue to work on not smoking at all.  visit with Dr. Chase Caller. Flu shot in fall  Followup  6 months or sooner if needed   Dr. Brand Males, M.D., [facility].C.P Pulmonary and Critical Care Medicine Staff Physician [place] [facility] Pager: [phone], If no answer or between  15:00h - 7:00h: call [phone]  05/03/2015 5:00 AM

## 2015-08-02 ENCOUNTER — Ambulatory Visit: Payer: Medicaid Other | Admitting: Pulmonary Disease

## 2015-09-07 DIAGNOSIS — E785 Hyperlipidemia, unspecified: Secondary | ICD-10-CM | POA: Diagnosis not present

## 2015-09-07 DIAGNOSIS — Z1389 Encounter for screening for other disorder: Secondary | ICD-10-CM | POA: Diagnosis not present

## 2015-09-07 DIAGNOSIS — J449 Chronic obstructive pulmonary disease, unspecified: Secondary | ICD-10-CM | POA: Diagnosis not present

## 2015-09-07 DIAGNOSIS — Z6833 Body mass index (BMI) 33.0-33.9, adult: Secondary | ICD-10-CM | POA: Diagnosis not present

## 2015-09-07 DIAGNOSIS — R972 Elevated prostate specific antigen [PSA]: Secondary | ICD-10-CM | POA: Diagnosis not present

## 2015-09-07 DIAGNOSIS — E1129 Type 2 diabetes mellitus with other diabetic kidney complication: Secondary | ICD-10-CM | POA: Diagnosis not present

## 2015-09-07 DIAGNOSIS — M542 Cervicalgia: Secondary | ICD-10-CM | POA: Diagnosis not present

## 2015-12-17 ENCOUNTER — Ambulatory Visit: Payer: Medicaid Other | Admitting: Internal Medicine

## 2015-12-27 ENCOUNTER — Ambulatory Visit (INDEPENDENT_AMBULATORY_CARE_PROVIDER_SITE_OTHER): Payer: Medicare Other | Admitting: Internal Medicine

## 2015-12-27 ENCOUNTER — Encounter: Payer: Self-pay | Admitting: Internal Medicine

## 2015-12-27 ENCOUNTER — Other Ambulatory Visit: Payer: Medicare Other

## 2015-12-27 VITALS — BP 114/70 | HR 104 | Ht 69.5 in | Wt 228.0 lb

## 2015-12-27 DIAGNOSIS — F17200 Nicotine dependence, unspecified, uncomplicated: Secondary | ICD-10-CM

## 2015-12-27 DIAGNOSIS — J449 Chronic obstructive pulmonary disease, unspecified: Secondary | ICD-10-CM

## 2015-12-27 DIAGNOSIS — Z720 Tobacco use: Secondary | ICD-10-CM | POA: Diagnosis not present

## 2015-12-27 NOTE — Patient Instructions (Addendum)
     ICD-9-CM ICD-10-CM   1. COPD, moderate ([place]) 496 J44.9   2. Smoking 305.1 Z72.0     Copd - stable Smoking - still active  PLAN Continue on DuoNeb 3 times daily Do albuterol 2 puff as needed - nurse will do refill Quit smoking Respect your decision to refuse flu and pneumonia shot Check alpha 1 blood genetic test for copd  Followup 12 months or sooner if needed

## 2015-12-27 NOTE — Progress Notes (Signed)
Subjective:     Patient ID: Joseph Manning, male   DOB: 11-28-1950, 65 y.o.   MRN: [id number]  HPI  /01/16/2015 [facility] follow up  Patient returns for post hospital follow-up Patient was admitted February 22 2 February 24 for community-acquired pneumonia, COPD exacerbation, right pleural effusion, sepsis. Patient was seen for pulmonary consultation. CT chest showed a partially loculated right pleural effusion. Right lower lobe consolidation Patient was treated with aggressive IV antibiotics, nebulizers and steroids. He underwent a thoracentesis with 360 cc of fluid removed Cytology returned with reactive mesothelial cells, and lymphocytosis No malignant cells noted Chest x-ray today shows improved right lower lobe infiltrate and small right pleural effusion He is feeling better . No hemopytsis .  Taking Duoneb Three times a day  . Did not get Symbicort ? Afford medicine.  Recently taken off ACE d/t low b/p.    OV 05/01/2015  Chief Complaint  Patient presents with  . Follow-up    Here to discuss PFT results. States that breathing is doing well. Denies any breating problems during visit     Here for followup  Smoking : he has cut down a lot  COPD:  Pulmonary function test today 05/01/2015 postbronchodilator FEV1 2.0 L/59%, ratio 72 and consistent with obstruction. DLCO of 28.3/89%. No broncho-dilator response. This is gold stage 2 copd. He feels good on duoneb. Does not want to change this regimen. Not interested in talking about vaccines; wants to leave  Recent pneunomonia: had followup CT. Personally visualized image and agree with report beow CT chst 03/01/15 IMPRESSION: No acute pulmonary findings, worrisome pulmonary lesions or pleural Effusion. No mediastinal or hilar mass or adenopathy.  Electronically Signed  By: Marijo Sanes M.D.  On: 03/01/2015 16:30   OV 12/27/2015  Chief Complaint  Patient presents with  . Follow-up    Pt states his breathing is  unchanged since last OV. Pt c/o dry cough and occassional right sided  pain under right breast.       Gold stage II COPD follow-up: This is six-month follow-up. Overall he is stable. No interim exacerbations. No change in symptoms. He uses DuoNeb as needed which is 2 times a day. He does not want to change this.  He refuse flu shot and pneumonia shot. He agreed to have alpha-1 antitrypsin checked today.  e smoking: He continues to smoke. He will not quit.   has a past medical history of Diabetes mellitus without complication ([place]); Hypertension; Depression; COPD (chronic obstructive pulmonary disease) ([place]); Tubular adenoma of colon (12/1999); Anxiety; and Arthritis.   reports that he has been smoking Cigarettes.  He has a 50 pack-year smoking history. He has never used smokeless tobacco.  Past Surgical History  Procedure Laterality Date  . Joint replacement Left   . Knee arthroscopy Left   . Cervical disc surgery    . Esophagogastroduodenoscopy N/A 12/28/2012    Procedure: ESOPHAGOGASTRODUODENOSCOPY (EGD);  Surgeon: Milus Banister, MD;  Location: [facility];  Service: Endoscopy;  Laterality: N/A;    Allergies  Allergen Reactions  . Fentanyl Other (See Comments)    unknown  . Versed [Midazolam] Other (See Comments)    unknown     There is no immunization history on file for this patient.  Family History  Problem Relation Age of Onset  . Alzheimer's disease Mother   . Cancer Father     unknown type     Current outpatient prescriptions:  .  albuterol (PROVENTIL HFA;VENTOLIN HFA) 108 (90 BASE)  MCG/ACT inhaler, Inhale 2 puffs into the lungs every 6 (six) hours as needed for wheezing or shortness of breath., Disp: 1 Inhaler, Rfl: 2 .  Aspirin-Acetaminophen-Caffeine (GOODY HEADACHE PO), Take 1 packet by mouth every 6 (six) hours as needed (for pain or headache). , Disp: , Rfl:  .  diazepam (VALIUM) 10 MG tablet, Take 10 mg by mouth 3 (three) times daily. , Disp: , Rfl:  .   guaiFENesin (MUCINEX) 600 MG 12 hr tablet, Take 1 tablet (600 mg total) by mouth 2 (two) times daily., Disp: 60 tablet, Rfl: 1 .  HYDROcodone-acetaminophen (NORCO/VICODIN) 5-325 MG per tablet, Take 1-2 tablets by mouth 3 (three) times daily. He takes two tablets in the morning and evening and one tablet midday., Disp: , Rfl: 0 .  insulin NPH-regular Human (NOVOLIN 70/30) (70-30) 100 UNIT/ML injection, 50 units in am and 50 units just before supper every day (Patient taking differently: 40-45 Units 2 (two) times daily with a meal. He takes 50 units in the morning and 50 units at night.), Disp: 20 mL, Rfl: 11 .  ipratropium-albuterol (DUONEB) 0.5-2.5 (3) MG/3ML SOLN, Take 3 mLs by nebulization 3 (three) times daily., Disp: , Rfl:  .  pantoprazole (PROTONIX) 40 MG tablet, Take 1 tablet (40 mg total) by mouth 2 (two) times daily., Disp: 60 tablet, Rfl: 3      Review of Systems  according to history of present illness    Objective:   Physical Exam  Filed Vitals:   12/27/15 1622  BP: 114/70  Pulse: 104  Height: 5' 9.5" (1.765 m)  Weight: 228 lb (103.42 kg)  SpO2: 96%     Gen. Exam: disheveled male. Smells of tobacco  HEENT: no thrush. Mild postnasal drip. Mallampati class II. No neck nodes. No elevated JVP.  Cardiovascular : Normal heart sounds. Regular rate and rhythm Respiratory clear to auscultation bilaterally. No wheeze. No respiratory distress. Overall diminished air entry with expiration prolonged  Abdomen: Obese and soft:   extremity score no cyanosis no clubbing no edema  Skin: intact in the exposed areas   musculoskeletal: No joint deformities or obvious.     Assessment:       ICD-9-CM ICD-10-CM   1. COPD, moderate ([place]) 496 J44.9 Alpha-1 antitrypsin phenotype  2. Smoking 305.1 Z72.0        Plan:       Copd - stable Smoking - still active  PLAN Continue on DuoNeb 3 times daily Do albuterol 2 puff as needed - nurse will do refill Quit smoking Respect your  decision to refuse flu and pneumonia shot Check alpha 1 blood genetic test for copd  Followup 12 months or sooner if needed    Dr. Brand Males, M.D., [facility].C.P Pulmonary and Critical Care Medicine Staff Physician [place] [facility] Pager: [phone], If no answer or between  15:00h - 7:00h: call [phone]  12/27/2015 4:48 PM

## 2016-01-01 LAB — ALPHA-1 ANTITRYPSIN PHENOTYPE: A1 ANTITRYPSIN: 132 mg/dL (ref 83–199)

## 2016-01-01 IMAGING — CR DG CHEST 2V
2 series · 2 of 2 positions shown · non-contrast
Comparison: 01/02/2015.

CLINICAL DATA: Cough for 2 weeks.

EXAM:
CHEST  2 VIEW

[view not recorded (1 of 2)]
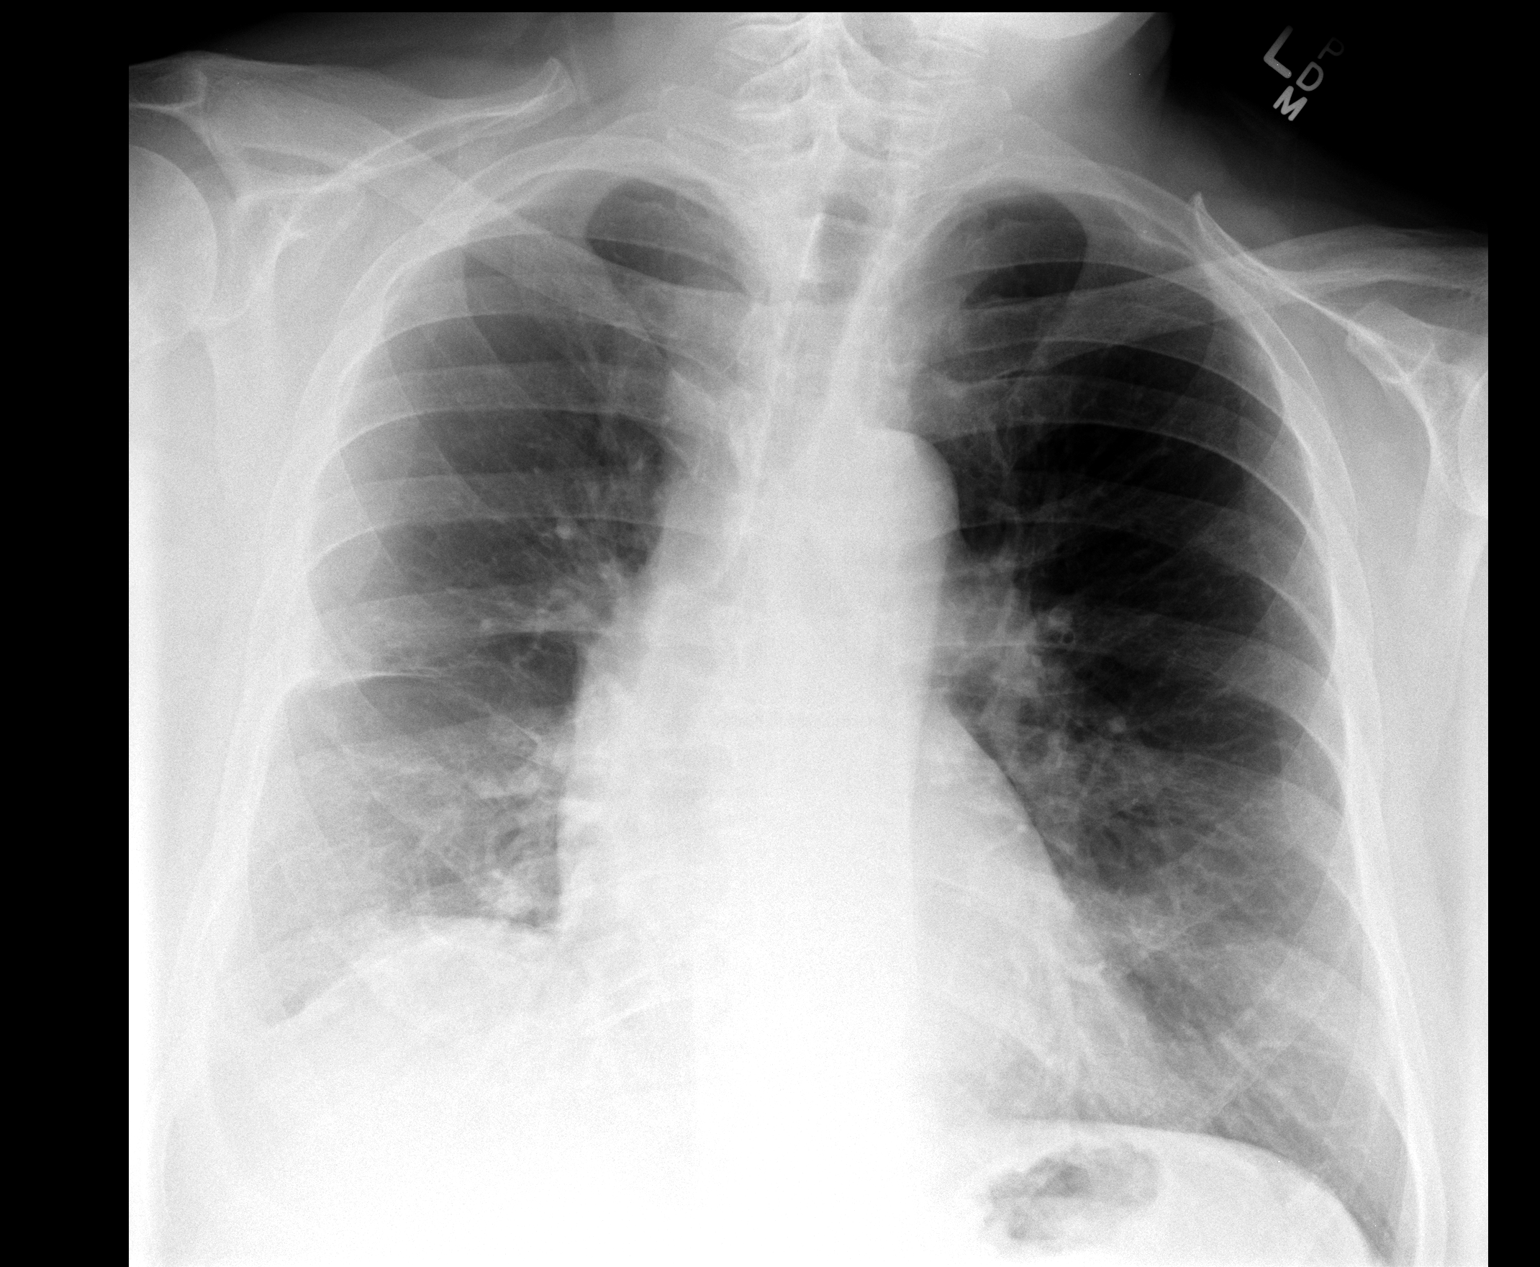

[view not recorded (2 of 2)]
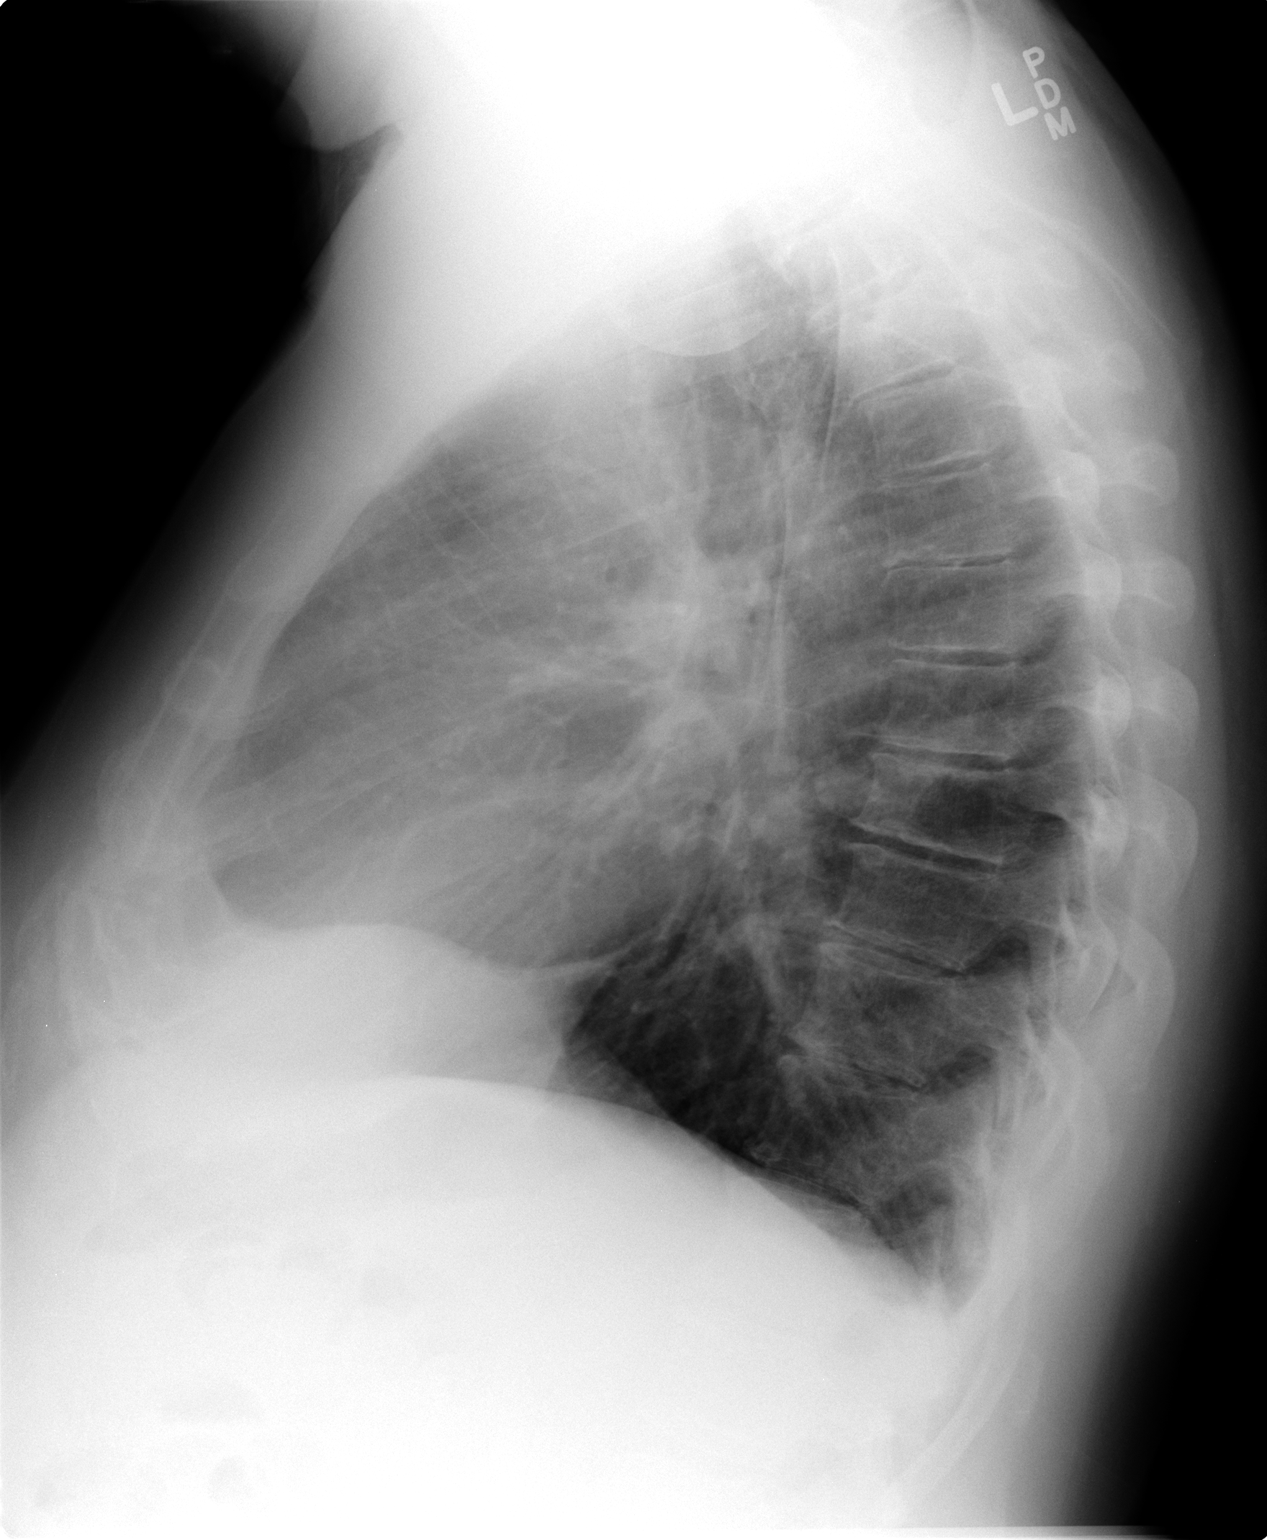

[2 of 2 positions shown; findings below may reference images not displayed]

FINDINGS: Mediastinum and hilar structures are normal. Right lower lobe
infiltrate with small right pleural effusion. Findings and improved
from prior study. Left lung is clear. Heart size stable. No
pneumothorax. No acute osseous abnormality.
IMPRESSION: Partial clearing of right lower lobe infiltrate and right pleural
effusion.

## 2016-01-08 NOTE — Progress Notes (Signed)
Quick Note:  Called and spoke to pt. Informed him of the results and recs per MR. Pt verbalized understanding and denied any further questions or concerns at this time.   ______ 

## 2016-01-18 DIAGNOSIS — E784 Other hyperlipidemia: Secondary | ICD-10-CM | POA: Diagnosis not present

## 2016-01-18 DIAGNOSIS — Z125 Encounter for screening for malignant neoplasm of prostate: Secondary | ICD-10-CM | POA: Diagnosis not present

## 2016-01-18 DIAGNOSIS — E1129 Type 2 diabetes mellitus with other diabetic kidney complication: Secondary | ICD-10-CM | POA: Diagnosis not present

## 2016-01-18 DIAGNOSIS — N183 Chronic kidney disease, stage 3 (moderate): Secondary | ICD-10-CM | POA: Diagnosis not present

## 2016-01-22 DIAGNOSIS — E784 Other hyperlipidemia: Secondary | ICD-10-CM | POA: Diagnosis not present

## 2016-01-22 DIAGNOSIS — F17210 Nicotine dependence, cigarettes, uncomplicated: Secondary | ICD-10-CM | POA: Diagnosis not present

## 2016-01-22 DIAGNOSIS — E1129 Type 2 diabetes mellitus with other diabetic kidney complication: Secondary | ICD-10-CM | POA: Diagnosis not present

## 2016-01-22 DIAGNOSIS — R972 Elevated prostate specific antigen [PSA]: Secondary | ICD-10-CM | POA: Diagnosis not present

## 2016-01-22 DIAGNOSIS — Z0000 Encounter for general adult medical examination without abnormal findings: Secondary | ICD-10-CM | POA: Diagnosis not present

## 2016-01-22 DIAGNOSIS — Z5181 Encounter for therapeutic drug level monitoring: Secondary | ICD-10-CM | POA: Diagnosis not present

## 2016-01-22 DIAGNOSIS — G8929 Other chronic pain: Secondary | ICD-10-CM | POA: Diagnosis not present

## 2016-01-22 DIAGNOSIS — Z1389 Encounter for screening for other disorder: Secondary | ICD-10-CM | POA: Diagnosis not present

## 2016-01-22 DIAGNOSIS — J449 Chronic obstructive pulmonary disease, unspecified: Secondary | ICD-10-CM | POA: Diagnosis not present

## 2016-01-31 ENCOUNTER — Emergency Department (HOSPITAL_COMMUNITY): Payer: Medicare Other

## 2016-01-31 ENCOUNTER — Observation Stay (HOSPITAL_COMMUNITY)
Admission: EM | Admit: 2016-01-31 | Discharge: 2016-02-02 | Disposition: A | Discharge status: Home / Self Care | Payer: Medicare Other | Attending: Internal Medicine | Admitting: Internal Medicine

## 2016-01-31 ENCOUNTER — Encounter (HOSPITAL_COMMUNITY): Payer: Self-pay | Admitting: Emergency Medicine

## 2016-01-31 DIAGNOSIS — J449 Chronic obstructive pulmonary disease, unspecified: Secondary | ICD-10-CM | POA: Diagnosis not present

## 2016-01-31 DIAGNOSIS — M1990 Unspecified osteoarthritis, unspecified site: Secondary | ICD-10-CM | POA: Insufficient documentation

## 2016-01-31 DIAGNOSIS — Z794 Long term (current) use of insulin: Secondary | ICD-10-CM | POA: Diagnosis not present

## 2016-01-31 DIAGNOSIS — R079 Chest pain, unspecified: Secondary | ICD-10-CM | POA: Diagnosis not present

## 2016-01-31 DIAGNOSIS — E1165 Type 2 diabetes mellitus with hyperglycemia: Secondary | ICD-10-CM | POA: Diagnosis not present

## 2016-01-31 DIAGNOSIS — I129 Hypertensive chronic kidney disease with stage 1 through stage 4 chronic kidney disease, or unspecified chronic kidney disease: Secondary | ICD-10-CM | POA: Diagnosis not present

## 2016-01-31 DIAGNOSIS — K760 Fatty (change of) liver, not elsewhere classified: Secondary | ICD-10-CM | POA: Diagnosis not present

## 2016-01-31 DIAGNOSIS — F17210 Nicotine dependence, cigarettes, uncomplicated: Secondary | ICD-10-CM | POA: Diagnosis not present

## 2016-01-31 DIAGNOSIS — I10 Essential (primary) hypertension: Secondary | ICD-10-CM | POA: Diagnosis present

## 2016-01-31 DIAGNOSIS — E1122 Type 2 diabetes mellitus with diabetic chronic kidney disease: Secondary | ICD-10-CM | POA: Diagnosis not present

## 2016-01-31 DIAGNOSIS — N183 Chronic kidney disease, stage 3 (moderate): Secondary | ICD-10-CM | POA: Insufficient documentation

## 2016-01-31 DIAGNOSIS — R0781 Pleurodynia: Secondary | ICD-10-CM | POA: Diagnosis present

## 2016-01-31 DIAGNOSIS — Z79899 Other long term (current) drug therapy: Secondary | ICD-10-CM | POA: Insufficient documentation

## 2016-01-31 DIAGNOSIS — Z9660 Presence of unspecified orthopedic joint implant: Secondary | ICD-10-CM | POA: Diagnosis not present

## 2016-01-31 DIAGNOSIS — Z79891 Long term (current) use of opiate analgesic: Secondary | ICD-10-CM | POA: Insufficient documentation

## 2016-01-31 DIAGNOSIS — Z590 Homelessness: Secondary | ICD-10-CM | POA: Insufficient documentation

## 2016-01-31 DIAGNOSIS — R778 Other specified abnormalities of plasma proteins: Secondary | ICD-10-CM

## 2016-01-31 DIAGNOSIS — R7989 Other specified abnormal findings of blood chemistry: Secondary | ICD-10-CM | POA: Diagnosis present

## 2016-01-31 DIAGNOSIS — R1011 Right upper quadrant pain: Secondary | ICD-10-CM | POA: Insufficient documentation

## 2016-01-31 DIAGNOSIS — R0602 Shortness of breath: Secondary | ICD-10-CM | POA: Insufficient documentation

## 2016-01-31 DIAGNOSIS — Z7982 Long term (current) use of aspirin: Secondary | ICD-10-CM | POA: Insufficient documentation

## 2016-01-31 DIAGNOSIS — R05 Cough: Secondary | ICD-10-CM | POA: Diagnosis not present

## 2016-01-31 DIAGNOSIS — R109 Unspecified abdominal pain: Secondary | ICD-10-CM | POA: Diagnosis present

## 2016-01-31 LAB — COMPREHENSIVE METABOLIC PANEL
ALT: 15 U/L — ABNORMAL LOW (ref 17–63)
AST: 16 U/L (ref 15–41)
Albumin: 4 g/dL (ref 3.5–5.0)
Alkaline Phosphatase: 81 U/L (ref 38–126)
Anion gap: 12 (ref 5–15)
BILIRUBIN TOTAL: 0.3 mg/dL (ref 0.3–1.2)
BUN: 30 mg/dL — ABNORMAL HIGH (ref 6–20)
CO2: 26 mmol/L (ref 22–32)
Calcium: 9 mg/dL (ref 8.9–10.3)
Chloride: 101 mmol/L (ref 101–111)
Creatinine, Ser: 1.3 mg/dL — ABNORMAL HIGH (ref 0.61–1.24)
GFR, EST NON AFRICAN AMERICAN: 56 mL/min — AB (ref >60)
GLUCOSE: 375 mg/dL — AB (ref 65–99)
POTASSIUM: 5.1 mmol/L (ref 3.5–5.1)
Sodium: 139 mmol/L (ref 135–145)
TOTAL PROTEIN: 7.5 g/dL (ref 6.5–8.1)

## 2016-01-31 LAB — CBC
HEMATOCRIT: 42.7 % (ref 39.0–52.0)
HEMOGLOBIN: 13.7 g/dL (ref 13.0–17.0)
MCH: 30.5 pg (ref 26.0–34.0)
MCHC: 32.1 g/dL (ref 30.0–36.0)
MCV: 95.1 fL (ref 78.0–100.0)
Platelets: 367 10*3/uL (ref 150–400)
RBC: 4.49 MIL/uL (ref 4.22–5.81)
RDW: 13.5 % (ref 11.5–15.5)
WBC: 5.7 10*3/uL (ref 4.0–10.5)

## 2016-01-31 LAB — BRAIN NATRIURETIC PEPTIDE: B NATRIURETIC PEPTIDE 5: 28.5 pg/mL (ref 0.0–100.0)

## 2016-01-31 LAB — I-STAT TROPONIN, ED: Troponin i, poc: 0.19 ng/mL (ref 0.00–0.08)

## 2016-01-31 LAB — PROTIME-INR
INR: 1.06 (ref 0.00–1.49)
Prothrombin Time: 13.6 seconds (ref 11.6–15.2)

## 2016-01-31 LAB — TROPONIN I
TROPONIN I: 0.16 ng/mL — AB (ref <0.031)
Troponin I: 0.17 ng/mL — ABNORMAL HIGH (ref <0.031)

## 2016-01-31 LAB — D-DIMER, QUANTITATIVE (NOT AT ARMC): D DIMER QUANT: 0.27 ug{FEU}/mL (ref 0.00–0.50)

## 2016-01-31 LAB — APTT: APTT: 28 s (ref 24–37)

## 2016-01-31 LAB — GLUCOSE, CAPILLARY: GLUCOSE-CAPILLARY: 176 mg/dL — AB (ref 65–99)

## 2016-01-31 LAB — LIPASE, BLOOD: LIPASE: 32 U/L (ref 11–51)

## 2016-01-31 MED ORDER — MORPHINE SULFATE (PF) 4 MG/ML IV SOLN
4.0000 mg | Freq: Once | INTRAVENOUS | Status: AC
  Administered 2016-01-31: 4 mg via INTRAVENOUS
  Filled 2016-01-31: qty 1

## 2016-01-31 MED ORDER — ALBUTEROL SULFATE (2.5 MG/3ML) 0.083% IN NEBU
3.0000 mL | INHALATION_SOLUTION | Freq: Four times a day (QID) | RESPIRATORY_TRACT | Status: DC | PRN
  Administered 2016-02-02: 3 mL via RESPIRATORY_TRACT
  Filled 2016-01-31: qty 3

## 2016-01-31 MED ORDER — SODIUM CHLORIDE 0.9 % IV SOLN
INTRAVENOUS | Status: DC
  Administered 2016-01-31: 21:00:00 via INTRAVENOUS

## 2016-01-31 MED ORDER — GUAIFENESIN ER 600 MG PO TB12
600.0000 mg | ORAL_TABLET | Freq: Two times a day (BID) | ORAL | Status: DC
  Administered 2016-01-31 – 2016-02-02 (×4): 600 mg via ORAL
  Filled 2016-01-31 (×5): qty 1

## 2016-01-31 MED ORDER — DIAZEPAM 5 MG PO TABS
10.0000 mg | ORAL_TABLET | Freq: Three times a day (TID) | ORAL | Status: DC
  Administered 2016-01-31 – 2016-02-02 (×5): 10 mg via ORAL
  Filled 2016-01-31 (×5): qty 2

## 2016-01-31 MED ORDER — CYCLOBENZAPRINE HCL 10 MG PO TABS: 10.0000 mg | ORAL_TABLET | Freq: Two times a day (BID) | ORAL | Status: DC | PRN

## 2016-01-31 MED ORDER — HEPARIN (PORCINE) IN NACL 100-0.45 UNIT/ML-% IJ SOLN
1250.0000 [IU]/h | INTRAMUSCULAR | Status: DC
  Administered 2016-01-31: 1250 [IU]/h via INTRAVENOUS
  Filled 2016-01-31: qty 250

## 2016-01-31 MED ORDER — MORPHINE SULFATE (PF) 2 MG/ML IV SOLN
1.0000 mg | INTRAVENOUS | Status: DC | PRN
  Administered 2016-02-01 – 2016-02-02 (×3): 1 mg via INTRAVENOUS
  Filled 2016-01-31 (×4): qty 1

## 2016-01-31 MED ORDER — ACETAMINOPHEN 325 MG PO TABS: 650.0000 mg | ORAL_TABLET | ORAL | Status: DC | PRN

## 2016-01-31 MED ORDER — ASPIRIN 81 MG PO CHEW
324.0000 mg | CHEWABLE_TABLET | Freq: Once | ORAL | Status: AC
  Administered 2016-01-31: 324 mg via ORAL
  Filled 2016-01-31: qty 4

## 2016-01-31 MED ORDER — INSULIN ASPART PROT & ASPART (70-30 MIX) 100 UNIT/ML PEN: 30.0000 [IU] | PEN_INJECTOR | Freq: Two times a day (BID) | SUBCUTANEOUS | Status: DC

## 2016-01-31 MED ORDER — TIOTROPIUM BROMIDE MONOHYDRATE 18 MCG IN CAPS
1.0000 | ORAL_CAPSULE | Freq: Every day | RESPIRATORY_TRACT | Status: DC
  Administered 2016-02-02: 18 ug via RESPIRATORY_TRACT
  Filled 2016-01-31: qty 5

## 2016-01-31 MED ORDER — ONDANSETRON HCL 4 MG/2ML IJ SOLN: 4.0000 mg | Freq: Four times a day (QID) | INTRAMUSCULAR | Status: DC | PRN

## 2016-01-31 MED ORDER — INSULIN ASPART PROT & ASPART (70-30 MIX) 100 UNIT/ML PEN
25.0000 [IU] | PEN_INJECTOR | Freq: Two times a day (BID) | SUBCUTANEOUS | Status: DC
  Administered 2016-01-31 – 2016-02-02 (×3): 25 [IU] via SUBCUTANEOUS
  Filled 2016-01-31: qty 3

## 2016-01-31 MED ORDER — HEPARIN BOLUS VIA INFUSION
4000.0000 [IU] | Freq: Once | INTRAVENOUS | Status: AC
  Administered 2016-01-31: 4000 [IU] via INTRAVENOUS
  Filled 2016-01-31: qty 4000

## 2016-01-31 MED ORDER — MOMETASONE FURO-FORMOTEROL FUM 200-5 MCG/ACT IN AERO
2.0000 | INHALATION_SPRAY | Freq: Two times a day (BID) | RESPIRATORY_TRACT | Status: DC
  Administered 2016-01-31 – 2016-02-02 (×3): 2 via RESPIRATORY_TRACT
  Filled 2016-01-31: qty 8.8

## 2016-01-31 MED ORDER — ASPIRIN EC 325 MG PO TBEC
325.0000 mg | DELAYED_RELEASE_TABLET | Freq: Every day | ORAL | Status: DC
  Administered 2016-02-01: 325 mg via ORAL
  Filled 2016-01-31: qty 1

## 2016-01-31 MED ORDER — HYDROCODONE-ACETAMINOPHEN 5-325 MG PO TABS
1.0000 | ORAL_TABLET | Freq: Three times a day (TID) | ORAL | Status: DC
  Administered 2016-01-31: 1 via ORAL
  Administered 2016-02-01 – 2016-02-02 (×4): 2 via ORAL
  Filled 2016-01-31 (×3): qty 2
  Filled 2016-01-31: qty 1
  Filled 2016-01-31: qty 2

## 2016-01-31 NOTE — H&P (Addendum)
[facility] History and Physical  Joseph Manning [id number] DOB: 1951/06/22 DOA: 01/31/2016  Referring physician: ER PA. PCP: [name], MD  Specialists:Dr. Chase Caller. Pulmonologist.  Chief Complaint: Right-sided chest pain.  HPI: NIVEK DUPUY is a 65 y.o. male history of COPD, diabetes mellitus type 2 presents to the ER because of right-sided chest pain. Patient has been having right-sided chest pain for last few days. Patient's pain radiates to the right flank area. In the ER chest x-ray and sonogram of the abdomen was unremarkable. EKG was showing normal sinus rhythm. Troponin is mildly elevated. Patient denies any shortness of breath. Patient has some productive cough which has been chronic. Patient has had pneumonia in the right side requiring thoracentesis last year. Personally patient is afebrile. Denies any nausea vomiting or diarrhea. Denies any skin rash. On exam patient's abdomen appears benign. Patient has been admitted for further management given patient's elevated troponin. ER PA did discuss the on call cardiologist Dr. Colon Flattery. Heparin was started by the ER physician subsequently.  Review of Systems: As presented in the history of presenting illness, rest negative.  Past Medical History  Diagnosis Date  . Diabetes mellitus without complication ([place])   . Hypertension   . Depression   . COPD (chronic obstructive pulmonary disease) ([place])   . Tubular adenoma of colon 12/1999  . Anxiety   . Arthritis    Past Surgical History  Procedure Laterality Date  . Joint replacement Left   . Knee arthroscopy Left   . Cervical disc surgery    . Esophagogastroduodenoscopy N/A 12/28/2012    Procedure: ESOPHAGOGASTRODUODENOSCOPY (EGD);  Surgeon: Milus Banister, MD;  Location: [facility];  Service: Endoscopy;  Laterality: N/A;   Social History:  reports that he has been smoking Cigarettes.  He has a 50 pack-year smoking history. He has never used smokeless tobacco.  He reports that he does not drink alcohol or use illicit drugs. Where does patient live At home. Can patient participate in ADLs? Yes.  Allergies  Allergen Reactions  . Fentanyl Other (See Comments)    unknown  . Versed [Midazolam] Other (See Comments)    unknown    Family History:  Family History  Problem Relation Age of Onset  . Alzheimer's disease Mother   . Cancer Father     unknown type      Prior to Admission medications   Medication Sig Start Date End Date Taking? Authorizing Provider  albuterol (PROVENTIL HFA;VENTOLIN HFA) 108 (90 BASE) MCG/ACT inhaler Inhale 2 puffs into the lungs every 6 (six) hours as needed for wheezing or shortness of breath. 05/01/15  Yes Brand Males, MD  cyclobenzaprine (FLEXERIL) 10 MG tablet Take 10 mg by mouth 2 (two) times daily as needed. for pain 01/14/16  Yes Historical Provider, MD  diazepam (VALIUM) 10 MG tablet Take 10 mg by mouth 3 (three) times daily.    Yes Historical Provider, MD  HYDROcodone-acetaminophen (NORCO/VICODIN) 5-325 MG per tablet Take 1-2 tablets by mouth 3 (three) times daily. He takes two tablets in the morning and evening and one tablet midday. 12/06/14  Yes Historical Provider, MD  NOVOLOG MIX 70/30 FLEXPEN (70-30) 100 UNIT/ML FlexPen Inject 45-50 Units as directed See admin instructions. Inject 45 units every morning and 50 units every evening. 01/25/16  Yes Historical Provider, MD  SPIRIVA HANDIHALER 18 MCG inhalation capsule Place 1 capsule into inhaler and inhale daily. 01/14/16  Yes Historical Provider, MD  SYMBICORT 160-4.5 MCG/ACT inhaler Inhale 1 puff into the lungs  2 (two) times daily. 01/14/16  Yes Historical Provider, MD  Aspirin-Acetaminophen-Caffeine (GOODY HEADACHE PO) Take 1 packet by mouth every 6 (six) hours as needed (for pain or headache).     Historical Provider, MD  guaiFENesin (MUCINEX) 600 MG 12 hr tablet Take 1 tablet (600 mg total) by mouth 2 (two) times daily. Patient not taking: Reported on 01/31/2016  01/03/15   [name], MD  insulin NPH-regular Human (NOVOLIN 70/30) (70-30) 100 UNIT/ML injection 50 units in am and 50 units just before supper every day Patient not taking: Reported on 01/31/2016 08/29/14   [name], MD  pantoprazole (PROTONIX) 40 MG tablet Take 1 tablet (40 mg total) by mouth 2 (two) times daily. Patient not taking: Reported on 01/31/2016 02/09/13   Loralie Champagne, PA-C    Physical Exam: Filed Vitals:   01/31/16 1600 01/31/16 1955 01/31/16 2011 01/31/16 2204  BP: 106/73 135/73  130/68  Pulse: 90 89  94  Temp: 98.2 F (36.8 C) 98.5 F (36.9 C)  97.6 F (36.4 C)  TempSrc: Oral Oral  Oral  Resp: 22 20  20   Height:    5\' 9"  (1.753 m)  Weight:   229 lb 7 oz (104.072 kg) 223 lb 5.2 oz (101.3 kg)  SpO2: 94% 92%  100%     General:  Moderately built and nourished.  Eyes: Anicteric no pallor.  ENT: No discharge from the ears eyes nose and mouth.  Neck: No mass felt.  Cardiovascular: S1 and S2 heard.  Respiratory: No rhonchi or crepitations.  Abdomen: Soft nontender bowel sounds present.  Skin: No rash.  Musculoskeletal: No edema.  Psychiatric: Appears normal.  Neurologic: Alert awake oriented to time place and person. Moves all extremities.  Labs on Admission:  Basic Metabolic Panel:  Recent Labs Lab 01/31/16 1613  NA 139  K 5.1  CL 101  CO2 26  GLUCOSE 375*  BUN 30*  CREATININE 1.30*  CALCIUM 9.0   Liver Function Tests:  Recent Labs Lab 01/31/16 1613  AST 16  ALT 15*  ALKPHOS 81  BILITOT 0.3  PROT 7.5  ALBUMIN 4.0    Recent Labs Lab 01/31/16 1613  LIPASE 32   No results for input(s): AMMONIA in the last 168 hours. CBC:  Recent Labs Lab 01/31/16 1613  WBC 5.7  HGB 13.7  HCT 42.7  MCV 95.1  PLT 367   Cardiac Enzymes:  Recent Labs Lab 01/31/16 1612  TROPONINI 0.17*    BNP (last 3 results)  Recent Labs  01/31/16 1613  BNP 28.5    ProBNP (last 3 results) No results for input(s): PROBNP in the  last 8760 hours.  CBG:  Recent Labs Lab 01/31/16 2249  GLUCAP 176*    Radiological Exams on Admission: Dg Chest 2 View  01/31/2016  CLINICAL DATA:  Abdominal pain and cough for 3 days EXAM: CHEST  2 VIEW COMPARISON:  01/16/15 FINDINGS: The heart size and vascular pattern are normal. Lungs are clear. There is no pleural effusion. There is no residual infiltrate. IMPRESSION: Negative Electronically Signed   By: Skipper Cliche M.D.   On: 01/31/2016 17:19   US Abdomen Complete  01/31/2016  CLINICAL DATA:  Right upper quadrant/ epigastric pain. EXAM: ABDOMEN ULTRASOUND COMPLETE COMPARISON:  01/01/2015 right upper quadrant ultrasound. 01/01/2015 CT abdomen and pelvis. FINDINGS: Gallbladder: No gallstones or wall thickening visualized. No sonographic Murphy sign noted by sonographer. Common bile duct: Diameter: 6 mm Liver: Coarse, echogenic liver compatible with steatosis as previously seen.  Fatty sparing near the gallbladder. IVC: No abnormality visualized. Pancreas: Visualized portion unremarkable. Spleen: Size and appearance within normal limits. Right Kidney: Length: 12.0 cm. Echogenicity within normal limits. No mass or hydronephrosis visualized. Left Kidney: Length: 11.3 cm. Echogenicity within normal limits. No mass or hydronephrosis visualized. Abdominal aorta: No aneurysm visualized. Other findings: None. IMPRESSION: 1. Hepatic steatosis. 2. Unremarkable appearance of the gallbladder. Electronically Signed   By: Logan Bores M.D.   On: 01/31/2016 19:51    EKG: Independently reviewed. Normal sinus rhythm low voltage.  Assessment/Plan Principal Problem:   Pleuritic chest pain Active Problems:   Hypertension   COPD, moderate ([facility])   Elevated troponin   Type 2 diabetes mellitus with hyperglycemia ([place])   1. Right-sided pleuritic chest pain with elevated troponin - if patient's chest pain persist I will get CT chest. Cycle cardiac markers. Aspirin. When necessary morphine. Check 2-D echo. I  have requested cardiology consult. 2. Diabetes mellitus type 2 with hyperglycemia - patient's blood sugar was around 300 in the ER. In improved by the time patient came to the floor. At this time I'm decreasing patient's home dose of insulin by half because patient will be nothing by mouth in the morning in anticipation of possible procedure. 3. COPD presently not wheezing continue all inhalers. 4. Chronic kidney disease stage III - creatinine appears to be at baseline. Continue hydration for now in anticipation of possible CAT scan.   DVT Prophylaxis heparin infusion.  Code Status: Full code.  Family Communication: Discussed with patient.  Disposition Plan: Admit for observation.    KAKRAKANDY,ARSHAD N. [facility] Pager [phone].   If 7PM-7AM, please contact night-coverage [email or web address] Password [facility] 01/31/2016, 10:54 PM

## 2016-01-31 NOTE — ED Notes (Signed)
Troponin= 0.19, PA Sanders notified

## 2016-01-31 NOTE — Progress Notes (Signed)
ANTICOAGULATION CONSULT NOTE - Initial Consult  Pharmacy Consult for Heparin Indication: chest pain/ACS  Allergies  Allergen Reactions  . Fentanyl Other (See Comments)    unknown  . Versed [Midazolam] Other (See Comments)    unknown    Patient Measurements: Weight: 229 lb 7 oz (104.072 kg) Heparin Dosing Weight: 92.8kg  Vital Signs: Temp: 98.5 F (36.9 C) (03/23 1955) Temp Source: Oral (03/23 1955) BP: 135/73 mmHg (03/23 1955) Pulse Rate: 89 (03/23 1955)  Labs:  Recent Labs  01/31/16 1612 01/31/16 1613  HGB  --  13.7  HCT  --  42.7  PLT  --  367  CREATININE  --  1.30*  TROPONINI 0.17*  --     Estimated Creatinine Clearance: 67.9 mL/min (by C-G formula based on Cr of 1.3).   Medical History: Past Medical History  Diagnosis Date  . Diabetes mellitus without complication ([place])   . Hypertension   . Depression   . COPD (chronic obstructive pulmonary disease) ([place])   . Tubular adenoma of colon 12/1999  . Anxiety   . Arthritis     Medications:   (Not in a hospital admission) Scheduled:  Infusions:   Assessment: 65yo M c/o right upper and lower abdominal pain x 2 days with nausea and vomiting. Troponin is elevated. Pharmacy is asked to dose heparin.   Goal of Therapy:  Heparin level 0.3-0.7 units/ml Monitor platelets by anticoagulation protocol: Yes   Plan:  Draw baseline aPTT and PT/INR. Give heparin 4000units IV bolus then start infusion at 1250units/hr. Check heparin level in 6hrs and daily thereafter. Check CBC q24h while on heparin. F/u daily.  Romeo Rabon, PharmD, pager [phone]. 01/31/2016,8:12 PM.

## 2016-01-31 NOTE — ED Provider Notes (Signed)
CSN: [id number]     Arrival date & time 01/31/16  1544 History   First MD Initiated Contact with Patient 01/31/16 1634     Chief Complaint  Patient presents with  . Abdominal Pain     (Consider location/radiation/quality/duration/timing/severity/associated sxs/prior Treatment) Patient is a 65 y.o. male presenting with abdominal pain. The history is provided by the patient and medical records.  Abdominal Pain Associated symptoms: cough, nausea and vomiting     65 year old male with history of hypertension, depression, diabetes, COPD, arthritis, anxiety, presenting to the ED for right-sided abdominal pain for the past 2 days. Patient states he has pain in hid mid and right upper abdomen.  He states he has some radiation of this pain to his back.  He states pain is like a burning with a "needles" sensation.  He states he had some nausea and vomiting 2 days ago, none since then, but has had decreased appetite.  Denies any diarrhea, bowel movements normal. Patient has also had productive cough with white phlegm. He has been wheezing. No fever or chills.  He tried using his home inhalers, but he reports this made the pain worse. Patient reports similar symptoms approximately 1 year ago when he was hospitalized for pneumonia and right-sided pleural effusion. He states he was also told at that time he had some "issues" with his gallbladder. He is not entirely sure what it was. He denies any prior abdominal surgeries.  Vital signs stable.  Past Medical History  Diagnosis Date  . Diabetes mellitus without complication ([place])   . Hypertension   . Depression   . COPD (chronic obstructive pulmonary disease) ([place])   . Tubular adenoma of colon 12/1999  . Anxiety   . Arthritis    Past Surgical History  Procedure Laterality Date  . Joint replacement Left   . Knee arthroscopy Left   . Cervical disc surgery    . Esophagogastroduodenoscopy N/A 12/28/2012    Procedure: ESOPHAGOGASTRODUODENOSCOPY (EGD);   Surgeon: Milus Banister, MD;  Location: [facility];  Service: Endoscopy;  Laterality: N/A;   Family History  Problem Relation Age of Onset  . Alzheimer's disease Mother   . Cancer Father     unknown type   Social History  Substance Use Topics  . Smoking status: Current Every Day Smoker -- 1.00 packs/day for 50 years    Types: Cigarettes  . Smokeless tobacco: Never Used  . Alcohol Use: No    Review of Systems  Respiratory: Positive for cough and wheezing.   Gastrointestinal: Positive for nausea, vomiting and abdominal pain.  All other systems reviewed and are negative.     Allergies  Fentanyl and Versed  Home Medications   Prior to Admission medications   Medication Sig Start Date End Date Taking? Authorizing Provider  albuterol (PROVENTIL HFA;VENTOLIN HFA) 108 (90 BASE) MCG/ACT inhaler Inhale 2 puffs into the lungs every 6 (six) hours as needed for wheezing or shortness of breath. 05/01/15   Brand Males, MD  Aspirin-Acetaminophen-Caffeine (GOODY HEADACHE PO) Take 1 packet by mouth every 6 (six) hours as needed (for pain or headache).     Historical Provider, MD  diazepam (VALIUM) 10 MG tablet Take 10 mg by mouth 3 (three) times daily.     Historical Provider, MD  guaiFENesin (MUCINEX) 600 MG 12 hr tablet Take 1 tablet (600 mg total) by mouth 2 (two) times daily. 01/03/15   [name], MD  HYDROcodone-acetaminophen (NORCO/VICODIN) 5-325 MG per tablet Take 1-2 tablets by mouth 3 (  three) times daily. He takes two tablets in the morning and evening and one tablet midday. 12/06/14   Historical Provider, MD  insulin NPH-regular Human (NOVOLIN 70/30) (70-30) 100 UNIT/ML injection 50 units in am and 50 units just before supper every day Patient taking differently: 40-45 Units 2 (two) times daily with a meal. He takes 50 units in the morning and 50 units at night. 08/29/14   [name], MD  ipratropium-albuterol (DUONEB) 0.5-2.5 (3) MG/3ML SOLN Take 3 mLs by nebulization 3  (three) times daily.    Historical Provider, MD  pantoprazole (PROTONIX) 40 MG tablet Take 1 tablet (40 mg total) by mouth 2 (two) times daily. 02/09/13   Janett Billow D Zehr, PA-C   BP 106/73 mmHg  Pulse 90  Temp(Src) 98.2 F (36.8 C) (Oral)  Resp 22  SpO2 94%   Physical Exam  Constitutional: He is oriented to person, place, and time. He appears well-developed and well-nourished.  HENT:  Head: Normocephalic and atraumatic.  Mouth/Throat: Oropharynx is clear and moist.  Eyes: Conjunctivae and EOM are normal. Pupils are equal, round, and reactive to light.  Neck: Normal range of motion.  Cardiovascular: Normal rate, regular rhythm and normal heart sounds.   Pulmonary/Chest: Effort normal. He has wheezes.  Expiratory wheezing in right middle and upper lobes; no distress, speaking in full sentences without difficulty  Abdominal: Soft. Bowel sounds are normal.  Musculoskeletal: Normal range of motion.  Neurological: He is alert and oriented to person, place, and time.  Skin: Skin is warm and dry.  Psychiatric: He has a normal mood and affect.  Nursing note and vitals reviewed.   ED Course  Procedures (including critical care time)  CRITICAL CARE Performed by: Larene Pickett   Total critical care time: 45 minutes  Critical care time was exclusive of separately billable procedures and treating other patients.  Critical care was necessary to treat or prevent imminent or life-threatening deterioration.  Critical care was time spent personally by me on the following activities: development of treatment plan with patient and/or surrogate as well as nursing, discussions with consultants, evaluation of patient's response to treatment, examination of patient, obtaining history from patient or surrogate, ordering and performing treatments and interventions, ordering and review of laboratory studies, ordering and review of radiographic studies, pulse oximetry and re-evaluation of patient's  condition.  Medications  heparin bolus via infusion 4,000 Units (not administered)  heparin ADULT infusion 100 units/mL (25000 units/250 mL) (not administered)  aspirin chewable tablet 324 mg (324 mg Oral Given 01/31/16 1857)  morphine 4 MG/ML injection 4 mg (4 mg Intravenous Given 01/31/16 1954)   Labs Review Labs Reviewed  COMPREHENSIVE METABOLIC PANEL - Abnormal; Notable for the following:    Glucose, Bld 375 (*)    BUN 30 (*)    Creatinine, Ser 1.30 (*)    ALT 15 (*)    GFR calc non Af Amer 56 (*)    All other components within normal limits  TROPONIN I - Abnormal; Notable for the following:    Troponin I 0.17 (*)    All other components within normal limits  I-STAT TROPOININ, ED - Abnormal; Notable for the following:    Troponin i, poc 0.19 (*)    All other components within normal limits  LIPASE, BLOOD  CBC  D-DIMER, QUANTITATIVE (NOT AT [facility])  BRAIN NATRIURETIC PEPTIDE  URINALYSIS, ROUTINE W REFLEX MICROSCOPIC (NOT AT [facility])    Imaging Review Dg Chest 2 View  01/31/2016  CLINICAL DATA:  Abdominal pain and cough for 3 days EXAM: CHEST  2 VIEW COMPARISON:  01/16/15 FINDINGS: The heart size and vascular pattern are normal. Lungs are clear. There is no pleural effusion. There is no residual infiltrate. IMPRESSION: Negative Electronically Signed   By: Skipper Cliche M.D.   On: 01/31/2016 17:19   I have personally reviewed and evaluated these images and lab results as part of my medical decision-making.   EKG Interpretation   Date/Time:  Thursday January 31 2016 18:07:09 EDT Ventricular Rate:  85 PR Interval:  154 QRS Duration: 94 QT Interval:  362 QTC Calculation: 430 R Axis:   58 Text Interpretation:  Sinus rhythm Low voltage, precordial leads Abnormal  R-wave progression, early transition No significant change since last  tracing Confirmed by FLOYD MD, Quillian Quince [id number]) on 01/31/2016 6:25:22 PM      MDM   Final diagnoses:  RUQ abdominal pain  Elevated troponin    65 year old male here with upper abdominal pain for the past few days. Patient described this morning and a "needles" sensation. He denies any chest pain or shortness of breath.  Patient reports similar symptoms one year ago when he was diagnosed with pneumonia and a pleural effusion.  EKG today without any acute ischemic changes. Chest x-ray is clear. Labwork is overall reassuring aside from elevated troponin at 0.19. Patient continues to deny any chest pain. Lab troponin, d-dimer, and BMP added on, troponin remains elevated.  Patient has no known cardiac hx.  He is a daily smoker.  Abdominal ultrasound reassuring-- no gallstones, no AAA.  VS remain stable.  Will speak with cardiology for recommendations.  ASA given.  Heparin drip started.  8:25 PM Case discussed with cardiology, Dr. Colon Flattery--   Given patient without any chest pain throughout presentation, recommends admission to medicine.  Hydrate, trend troponins to see if it clears, serial EKG's.  Re-page cardiology if any acute changes.  Patient admitted to medicine service by Dr. Hal Hope.    Larene Pickett, PA-C 01/31/16 2144  Deno Etienne, DO 01/31/16 2308

## 2016-01-31 NOTE — ED Notes (Signed)
Pt c/o right upper and lower abdominal pain x 2 days with nausea and vomiting. States it feels like it's burning and tingling. Denies SOB, difficulty urinating. Hx of diabetes

## 2016-02-01 ENCOUNTER — Observation Stay (HOSPITAL_COMMUNITY): Payer: Medicare Other

## 2016-02-01 ENCOUNTER — Encounter (HOSPITAL_BASED_OUTPATIENT_CLINIC_OR_DEPARTMENT_OTHER): Payer: Medicare Other

## 2016-02-01 ENCOUNTER — Encounter (HOSPITAL_COMMUNITY): Payer: Self-pay | Admitting: Radiology

## 2016-02-01 DIAGNOSIS — E1165 Type 2 diabetes mellitus with hyperglycemia: Secondary | ICD-10-CM

## 2016-02-01 DIAGNOSIS — E785 Hyperlipidemia, unspecified: Secondary | ICD-10-CM

## 2016-02-01 DIAGNOSIS — R0781 Pleurodynia: Secondary | ICD-10-CM

## 2016-02-01 DIAGNOSIS — R079 Chest pain, unspecified: Secondary | ICD-10-CM | POA: Diagnosis not present

## 2016-02-01 DIAGNOSIS — R7989 Other specified abnormal findings of blood chemistry: Secondary | ICD-10-CM

## 2016-02-01 DIAGNOSIS — J449 Chronic obstructive pulmonary disease, unspecified: Secondary | ICD-10-CM | POA: Diagnosis not present

## 2016-02-01 DIAGNOSIS — I10 Essential (primary) hypertension: Secondary | ICD-10-CM | POA: Diagnosis not present

## 2016-02-01 LAB — NM MYOCAR MULTI W/SPECT W/WALL MOTION / EF
CHL CUP RESTING HR STRESS: 81 {beats}/min
CSEPED: 5 min
Estimated workload: 1 METS
MPHR: 155 {beats}/min
Peak HR: 103 {beats}/min
Percent HR: 66 %

## 2016-02-01 LAB — CBC
HEMATOCRIT: 40.4 % (ref 39.0–52.0)
HEMOGLOBIN: 13.4 g/dL (ref 13.0–17.0)
MCH: 31.5 pg (ref 26.0–34.0)
MCHC: 33.2 g/dL (ref 30.0–36.0)
MCV: 94.8 fL (ref 78.0–100.0)
Platelets: 326 10*3/uL (ref 150–400)
RBC: 4.26 MIL/uL (ref 4.22–5.81)
RDW: 13.7 % (ref 11.5–15.5)
WBC: 7.5 10*3/uL (ref 4.0–10.5)

## 2016-02-01 LAB — URINALYSIS, ROUTINE W REFLEX MICROSCOPIC
BILIRUBIN URINE: NEGATIVE
Glucose, UA: NEGATIVE mg/dL
HGB URINE DIPSTICK: NEGATIVE
KETONES UR: NEGATIVE mg/dL
Leukocytes, UA: NEGATIVE
NITRITE: NEGATIVE
Protein, ur: NEGATIVE mg/dL
Specific Gravity, Urine: 1.02 (ref 1.005–1.030)
pH: 5 (ref 5.0–8.0)

## 2016-02-01 LAB — TROPONIN I
Troponin I: 0.17 ng/mL — ABNORMAL HIGH (ref <0.031)
Troponin I: 0.16 ng/mL — ABNORMAL HIGH (ref <0.031)

## 2016-02-01 LAB — LIPID PANEL
CHOL/HDL RATIO: 5.4 ratio
Cholesterol: 205 mg/dL — ABNORMAL HIGH (ref 0–200)
HDL: 38 mg/dL — AB (ref >40)
LDL CALC: 133 mg/dL — AB (ref 0–99)
Triglycerides: 170 mg/dL — ABNORMAL HIGH (ref <150)
VLDL: 34 mg/dL (ref 0–40)

## 2016-02-01 LAB — GLUCOSE, CAPILLARY
GLUCOSE-CAPILLARY: 144 mg/dL — AB (ref 65–99)
Glucose-Capillary: 139 mg/dL — ABNORMAL HIGH (ref 65–99)
Glucose-Capillary: 129 mg/dL — ABNORMAL HIGH (ref 65–99)
Glucose-Capillary: 182 mg/dL — ABNORMAL HIGH (ref 65–99)

## 2016-02-01 LAB — HEPARIN LEVEL (UNFRACTIONATED)
HEPARIN UNFRACTIONATED: 0.24 [IU]/mL — AB (ref 0.30–0.70)
HEPARIN UNFRACTIONATED: 0.28 [IU]/mL — AB (ref 0.30–0.70)

## 2016-02-01 MED ORDER — ATORVASTATIN CALCIUM 80 MG PO TABS
80.0000 mg | ORAL_TABLET | Freq: Every day | ORAL | Status: DC
  Administered 2016-02-01: 80 mg via ORAL
  Filled 2016-02-01 (×2): qty 1

## 2016-02-01 MED ORDER — IOHEXOL 350 MG/ML SOLN
100.0000 mL | Freq: Once | INTRAVENOUS | Status: AC | PRN
Start: 2016-02-01 — End: 2016-02-01
  Administered 2016-02-01: 100 mL via INTRAVENOUS

## 2016-02-01 MED ORDER — CLOPIDOGREL BISULFATE 75 MG PO TABS
75.0000 mg | ORAL_TABLET | Freq: Every day | ORAL | Status: DC
  Filled 2016-02-01: qty 1

## 2016-02-01 MED ORDER — HEPARIN (PORCINE) IN NACL 100-0.45 UNIT/ML-% IJ SOLN
1600.0000 [IU]/h | INTRAMUSCULAR | Status: DC
  Administered 2016-02-01: 1600 [IU]/h via INTRAVENOUS
  Filled 2016-02-01 (×3): qty 250

## 2016-02-01 MED ORDER — TECHNETIUM TC 99M SESTAMIBI GENERIC - CARDIOLITE
10.0000 | Freq: Once | INTRAVENOUS | Status: AC | PRN
  Administered 2016-02-01: 10 via INTRAVENOUS

## 2016-02-01 MED ORDER — REGADENOSON 0.4 MG/5ML IV SOLN
0.4000 mg | Freq: Once | INTRAVENOUS | Status: DC
  Filled 2016-02-01: qty 5

## 2016-02-01 MED ORDER — TECHNETIUM TC 99M SESTAMIBI GENERIC - CARDIOLITE
30.0000 | Freq: Once | INTRAVENOUS | Status: AC | PRN
  Administered 2016-02-01: 30 via INTRAVENOUS

## 2016-02-01 MED ORDER — INSULIN ASPART 100 UNIT/ML ~~LOC~~ SOLN
0.0000 [IU] | Freq: Three times a day (TID) | SUBCUTANEOUS | Status: DC
  Administered 2016-02-01: 1 [IU] via SUBCUTANEOUS
  Administered 2016-02-01: 2 [IU] via SUBCUTANEOUS
  Administered 2016-02-01: 1 [IU] via SUBCUTANEOUS
  Administered 2016-02-02: 3 [IU] via SUBCUTANEOUS

## 2016-02-01 MED ORDER — ASPIRIN EC 81 MG PO TBEC
81.0000 mg | DELAYED_RELEASE_TABLET | Freq: Every day | ORAL | Status: DC
  Administered 2016-02-02: 81 mg via ORAL
  Filled 2016-02-01: qty 1

## 2016-02-01 NOTE — Progress Notes (Signed)
ANTICOAGULATION CONSULT NOTE - Follow Up Consult  Pharmacy Consult for heparin Indication: chest pain/ACS  Allergies  Allergen Reactions  . Fentanyl Other (See Comments)    unknown  . Versed [Midazolam] Other (See Comments)    unknown    Patient Measurements: Height: 5\' 9"  (175.3 cm) Weight: 223 lb 5.2 oz (101.3 kg) IBW/kg (Calculated) : 70.7 Heparin Dosing Weight: 93 kg  Vital Signs: Temp: 99.7 F (37.6 C) (03/24 0532) Temp Source: Oral (03/24 0532) BP: 128/50 mmHg (03/24 0532) Pulse Rate: 89 (03/24 0532)  Labs:  Recent Labs  01/31/16 1613 01/31/16 2015 01/31/16 2320 02/01/16 0302 02/01/16 0513 02/01/16 1024  HGB 13.7  --   --   --   --   --   HCT 42.7  --   --   --   --   --   PLT 367  --   --   --   --   --   APTT  --  28  --   --   --   --   LABPROT  --  13.6  --   --   --   --   INR  --  1.06  --   --   --   --   HEPARINUNFRC  --   --   --  0.24*  --  0.28*  CREATININE 1.30*  --   --   --   --   --   TROPONINI  --   --  0.16*  --  0.17* 0.16*    Estimated Creatinine Clearance: 66.4 mL/min (by C-G formula based on Cr of 1.3).  Assessment: Patient's a 65yo homeless M presented to the ED on 3/23 with c/o right upper and lower abdominal pain x 2 days with nausea and vomiting. He was found to have elevated troponin and heparin drip started on admission for r/o ACS.  Plan for stress test at [facility] on 3/24.  Today, 02/01/2016: - heparin level now back slightly below goal at 0.28 with rate increased to 1500 units/hr this morning. -  CBC ok - no bleeding documented  Goal of Therapy:  Heparin level 0.3-0.7 units/ml Monitor platelets by anticoagulation protocol: Yes   Plan:  - increase heparin drip to 1600 units/hr - recheck another 6 hour heparin level  - monitor for s/s bleeding - f/u stress test results  Dia Sitter P 02/01/2016,12:01 PM

## 2016-02-01 NOTE — Care Management Obs Status (Signed)
[place] NOTIFICATION   Patient Details  Name: Joseph Manning MRN: [id number] Date of Birth: 24-Jun-1951   Medicare Observation Status Notification Given:  Yes    MahabirJuliann Pulse, RN 02/01/2016, 10:43 AM

## 2016-02-01 NOTE — Consult Note (Signed)
Reason for Consult: chest pain, elevated troponin   Referring Physician: Dr. Hal Hope   PCP:  [name], MD  Primary Cardiologist:  Joseph Manning is an 65 y.o. male.    Chief Complaint: admitted last pm with rt sided chest pain  HPI:  Asked to see 65 year old male with no prior cardiac issues but +COPD, + tobacco half a cigarette a month, DM-2 and HTN.  He presented with rt sided chest pain that began a few days ago + radiation to Rt. Flank area.  abd u/s unremarkable and CXR stable as well CT of chest with no PE.  PNA in 12/2014 and thoracentesis.   EKG SR with no acute changes, BNP 28.5, troponin POC 0.19>> 0.16>>0.17>>0.17   Currently SR, still with pain, though very sleepy from pain meds.  Pt is homeless.    Past Medical History  Diagnosis Date  . Diabetes mellitus without complication ([place])   . Hypertension   . Depression   . COPD (chronic obstructive pulmonary disease) ([place])   . Tubular adenoma of colon 12/1999  . Anxiety   . Arthritis     Past Surgical History  Procedure Laterality Date  . Joint replacement Left   . Knee arthroscopy Left   . Cervical disc surgery    . Esophagogastroduodenoscopy N/A 12/28/2012    Procedure: ESOPHAGOGASTRODUODENOSCOPY (EGD);  Surgeon: Milus Banister, MD;  Location: [facility];  Service: Endoscopy;  Laterality: N/A;    Family History  Problem Relation Age of Onset  . Alzheimer's disease Mother   . Cancer Father     unknown type   Social History:  reports that he has been smoking Cigarettes.  He has a 50 pack-year smoking history. He has never used smokeless tobacco. He reports that he does not drink alcohol or use illicit drugs.  Allergies:  Allergies  Allergen Reactions  . Fentanyl Other (See Comments)    unknown  . Versed [Midazolam] Other (See Comments)    unknown    OUTPATIENT MEDICATIONS: No current facility-administered medications on file prior to encounter.   Current  Outpatient Prescriptions on File Prior to Encounter  Medication Sig Dispense Refill  . albuterol (PROVENTIL HFA;VENTOLIN HFA) 108 (90 BASE) MCG/ACT inhaler Inhale 2 puffs into the lungs every 6 (six) hours as needed for wheezing or shortness of breath. 1 Inhaler 2  . diazepam (VALIUM) 10 MG tablet Take 10 mg by mouth 3 (three) times daily.     Marland Kitchen HYDROcodone-acetaminophen (NORCO/VICODIN) 5-325 MG per tablet Take 1-2 tablets by mouth 3 (three) times daily. He takes two tablets in the morning and evening and one tablet midday.  0  . Aspirin-Acetaminophen-Caffeine (GOODY HEADACHE PO) Take 1 packet by mouth every 6 (six) hours as needed (for pain or headache).     Marland Kitchen guaiFENesin (MUCINEX) 600 MG 12 hr tablet Take 1 tablet (600 mg total) by mouth 2 (two) times daily. (Patient not taking: Reported on 01/31/2016) 60 tablet 1  . insulin NPH-regular Human (NOVOLIN 70/30) (70-30) 100 UNIT/ML injection 50 units in am and 50 units just before supper every day (Patient not taking: Reported on 01/31/2016) 20 mL 11  . pantoprazole (PROTONIX) 40 MG tablet Take 1 tablet (40 mg total) by mouth 2 (two) times daily. (Patient not taking: Reported on 01/31/2016) 60 tablet 3     Results for orders placed or performed during the hospital encounter of 01/31/16 (from the past 48  hour(s))  Troponin I     Status: Abnormal   Collection Time: 01/31/16  4:12 PM  Result Value Ref Range   Troponin I 0.17 (H) <0.031 ng/mL    Comment:        PERSISTENTLY INCREASED TROPONIN VALUES IN THE RANGE OF 0.04-0.49 ng/mL CAN BE SEEN IN:       -UNSTABLE ANGINA       -CONGESTIVE HEART FAILURE       -MYOCARDITIS       -CHEST TRAUMA       -ARRYHTHMIAS       -LATE PRESENTING MYOCARDIAL INFARCTION       -COPD   CLINICAL FOLLOW-UP RECOMMENDED.   D-dimer, quantitative (not at [facility])     Status: None   Collection Time: 01/31/16  4:12 PM  Result Value Ref Range   D-Dimer, Quant 0.27 0.00 - 0.50 ug/mL-FEU    Comment: (NOTE) At the manufacturer  cut-off of 0.50 ug/mL FEU, this assay has been documented to exclude PE with a sensitivity and negative predictive value of 97 to 99%.  At this time, this assay has not been approved by the FDA to exclude DVT/VTE. Results should be correlated with clinical presentation.   Lipase, blood     Status: None   Collection Time: 01/31/16  4:13 PM  Result Value Ref Range   Lipase 32 11 - 51 U/L  Comprehensive metabolic panel     Status: Abnormal   Collection Time: 01/31/16  4:13 PM  Result Value Ref Range   Sodium 139 135 - 145 mmol/L   Potassium 5.1 3.5 - 5.1 mmol/L   Chloride 101 101 - 111 mmol/L   CO2 26 22 - 32 mmol/L   Glucose, Bld 375 (H) 65 - 99 mg/dL   BUN 30 (H) 6 - 20 mg/dL   Creatinine, Ser 1.30 (H) 0.61 - 1.24 mg/dL   Calcium 9.0 8.9 - 10.3 mg/dL   Total Protein 7.5 6.5 - 8.1 g/dL   Albumin 4.0 3.5 - 5.0 g/dL   AST 16 15 - 41 U/L   ALT 15 (L) 17 - 63 U/L   Alkaline Phosphatase 81 38 - 126 U/L   Total Bilirubin 0.3 0.3 - 1.2 mg/dL   GFR calc non Af Amer 56 (L) >60 mL/min   GFR calc Af Amer >60 >60 mL/min    Comment: (NOTE) The eGFR has been calculated using the CKD EPI equation. This calculation has not been validated in all clinical situations. eGFR's persistently <60 mL/min signify possible Chronic Kidney Disease.    Anion gap 12 5 - 15  CBC     Status: None   Collection Time: 01/31/16  4:13 PM  Result Value Ref Range   WBC 5.7 4.0 - 10.5 K/uL   RBC 4.49 4.22 - 5.81 MIL/uL   Hemoglobin 13.7 13.0 - 17.0 g/dL   HCT 42.7 39.0 - 52.0 %   MCV 95.1 78.0 - 100.0 fL   MCH 30.5 26.0 - 34.0 pg   MCHC 32.1 30.0 - 36.0 g/dL   RDW 13.5 11.5 - 15.5 %   Platelets 367 150 - 400 K/uL  Brain natriuretic peptide     Status: None   Collection Time: 01/31/16  4:13 PM  Result Value Ref Range   B Natriuretic Peptide 28.5 0.0 - 100.0 pg/mL  I-stat troponin, ED     Status: Abnormal   Collection Time: 01/31/16  6:08 PM  Result Value Ref Range   Troponin i, poc  0.19 (HH) 0.00 - 0.08  ng/mL   Comment NOTIFIED PHYSICIAN    Comment 3            Comment: Due to the release kinetics of cTnI, a negative result within the first hours of the onset of symptoms does not rule out myocardial infarction with certainty. If myocardial infarction is still suspected, repeat the test at appropriate intervals.   Protime-INR     Status: None   Collection Time: 01/31/16  8:15 PM  Result Value Ref Range   Prothrombin Time 13.6 11.6 - 15.2 seconds   INR 1.06 0.00 - 1.49  APTT     Status: None   Collection Time: 01/31/16  8:15 PM  Result Value Ref Range   aPTT 28 24 - 37 seconds  Glucose, capillary     Status: Abnormal   Collection Time: 01/31/16 10:49 PM  Result Value Ref Range   Glucose-Capillary 176 (H) 65 - 99 mg/dL  Troponin I (q 6hr x 3)     Status: Abnormal   Collection Time: 01/31/16 11:20 PM  Result Value Ref Range   Troponin I 0.16 (H) <0.031 ng/mL    Comment:        PERSISTENTLY INCREASED TROPONIN VALUES IN THE RANGE OF 0.04-0.49 ng/mL CAN BE SEEN IN:       -UNSTABLE ANGINA       -CONGESTIVE HEART FAILURE       -MYOCARDITIS       -CHEST TRAUMA       -ARRYHTHMIAS       -LATE PRESENTING MYOCARDIAL INFARCTION       -COPD   CLINICAL FOLLOW-UP RECOMMENDED.   Heparin level (unfractionated)     Status: Abnormal   Collection Time: 02/01/16  3:02 AM  Result Value Ref Range   Heparin Unfractionated 0.24 (L) 0.30 - 0.70 IU/mL    Comment:        IF HEPARIN RESULTS ARE BELOW EXPECTED VALUES, AND PATIENT DOSAGE HAS BEEN CONFIRMED, SUGGEST FOLLOW UP TESTING OF ANTITHROMBIN III LEVELS.   Troponin I (q 6hr x 3)     Status: Abnormal   Collection Time: 02/01/16  5:13 AM  Result Value Ref Range   Troponin I 0.17 (H) <0.031 ng/mL    Comment:        PERSISTENTLY INCREASED TROPONIN VALUES IN THE RANGE OF 0.04-0.49 ng/mL CAN BE SEEN IN:       -UNSTABLE ANGINA       -CONGESTIVE HEART FAILURE       -MYOCARDITIS       -CHEST TRAUMA       -ARRYHTHMIAS       -LATE  PRESENTING MYOCARDIAL INFARCTION       -COPD   CLINICAL FOLLOW-UP RECOMMENDED.   Urinalysis, Routine w reflex microscopic (not at [facility])     Status: None   Collection Time: 02/01/16  7:14 AM  Result Value Ref Range   Color, Urine YELLOW YELLOW   APPearance CLEAR CLEAR   Specific Gravity, Urine 1.020 1.005 - 1.030   pH 5.0 5.0 - 8.0   Glucose, UA NEGATIVE NEGATIVE mg/dL   Hgb urine dipstick NEGATIVE NEGATIVE   Bilirubin Urine NEGATIVE NEGATIVE   Ketones, ur NEGATIVE NEGATIVE mg/dL   Protein, ur NEGATIVE NEGATIVE mg/dL   Nitrite NEGATIVE NEGATIVE   Leukocytes, UA NEGATIVE NEGATIVE    Comment: MICROSCOPIC NOT DONE ON URINES WITH NEGATIVE PROTEIN, BLOOD, LEUKOCYTES, NITRITE, OR GLUCOSE <1000 mg/dL.  Glucose, capillary     Status:  Abnormal   Collection Time: 02/01/16  8:53 AM  Result Value Ref Range   Glucose-Capillary 139 (H) 65 - 99 mg/dL   Comment 1 Notify RN    Comment 2 Document in Chart    Dg Chest 2 View  01/31/2016  CLINICAL DATA:  Abdominal pain and cough for 3 days EXAM: CHEST  2 VIEW COMPARISON:  01/16/15 FINDINGS: The heart size and vascular pattern are normal. Lungs are clear. There is no pleural effusion. There is no residual infiltrate. IMPRESSION: Negative Electronically Signed   By: Skipper Cliche M.D.   On: 01/31/2016 17:19   Ct Angio Chest Pe W/cm &/or Wo Cm  02/01/2016  CLINICAL DATA:  Right-sided chest pain with productive cough EXAM: CT ANGIOGRAPHY CHEST WITH CONTRAST TECHNIQUE: Multidetector CT imaging of the chest was performed using the standard protocol during bolus administration of intravenous contrast. Multiplanar CT image reconstructions and MIPs were obtained to evaluate the vascular anatomy. CONTRAST:  186m OMNIPAQUE IOHEXOL 350 MG/ML SOLN COMPARISON:  None. FINDINGS: The lungs are well aerated bilaterally. No focal confluent infiltrate or sizable effusion is seen. Diffuse bronchial thickening is noted consistent with bronchitis. No focal mass lesion is noted.  The thoracic inlet shows the thoracic aorta and its branches to be within normal limits. The pulmonary artery shows a normal branching pattern. No filling defects to suggest pulmonary emboli are noted. No significant hilar or mediastinal adenopathy is seen. The visualized upper abdomen is within normal limits. The bony structures show no acute abnormality. Review of the MIP images confirms the above findings. IMPRESSION: No evidence of pulmonary emboli. Diffuse bronchial thickening likely related to bronchitis. No focal infiltrate is seen. Electronically Signed   By: MInez CatalinaM.D.   On: 02/01/2016 08:53   UKoreaAbdomen Complete  01/31/2016  CLINICAL DATA:  Right upper quadrant/ epigastric pain. EXAM: ABDOMEN ULTRASOUND COMPLETE COMPARISON:  01/01/2015 right upper quadrant ultrasound. 01/01/2015 CT abdomen and pelvis. FINDINGS: Gallbladder: No gallstones or wall thickening visualized. No sonographic Murphy sign noted by sonographer. Common bile duct: Diameter: 6 mm Liver: Coarse, echogenic liver compatible with steatosis as previously seen. Fatty sparing near the gallbladder. IVC: No abnormality visualized. Pancreas: Visualized portion unremarkable. Spleen: Size and appearance within normal limits. Right Kidney: Length: 12.0 cm. Echogenicity within normal limits. No mass or hydronephrosis visualized. Left Kidney: Length: 11.3 cm. Echogenicity within normal limits. No mass or hydronephrosis visualized. Abdominal aorta: No aneurysm visualized. Other findings: None. IMPRESSION: 1. Hepatic steatosis. 2. Unremarkable appearance of the gallbladder. Electronically Signed   By: ALogan BoresM.D.   On: 01/31/2016 19:51    ROS: General:no colds or fevers, no weight changes Skin:no rashes or ulcers HEENT:no blurred vision, no congestion CV:see HPI PUL:see HPI GI:no diarrhea constipation or melena, no indigestion GU:no hematuria, no dysuria MS:no joint pain, no claudication Neuro:no syncope, no  lightheadedness Endo:+ diabetes, no thyroid disease   Blood pressure 128/50, pulse 89, temperature 99.7 F (37.6 C), temperature source Oral, resp. rate 20, height '5\' 9"'  (1.753 m), weight 223 lb 5.2 oz (101.3 kg), SpO2 98 %.  Wt Readings from Last 3 Encounters:  01/31/16 223 lb 5.2 oz (101.3 kg)  12/27/15 228 lb (103.42 kg)  05/01/15 240 lb (108.863 kg)    PE: General:Pleasant affect, NAD, sleepy but still with pain though was sound asleep Skin:Warm and dry, brisk capillary refill HEENT:normocephalic, sclera clear, mucus membranes moist Neck:supple, no JVD, no bruits  Heart:S1S2 RRR without murmur, gallup, rub or click Lungs:clear without rales, rhonchi,  or wheezes OIT:GPQD, + tenderness Rt upper quad, + BS, do not palpate liver spleen or masses Ext:no lower ext edema, 2+ pedal pulses, 2+ radial pulses, feet with mud on bottom Neuro:alert and oriented X 3, MAE, follows commands, + facial symmetry    Assessment/Plan Principal Problem:   Pleuritic chest pain Active Problems:   Hypertension   COPD, moderate ([facility])   Elevated troponin   Type 2 diabetes mellitus with hyperglycemia ([place])  1. Chest pain atypical but with tobacco hx DM-2 will do nuc study as pt is NPO.  Dr. Irish Lack to see will check lipids.  Is on IV heparin  [facility] can do nuc today. Will add plavix 75 mg daily.  2.  HTN controlled  3. DM-2 per IM   [facility] R  Nurse Practitioner Certified [place] Pager [phone] or after 5pm or weekends call [phone] 02/01/2016, 10:39 AM

## 2016-02-01 NOTE — Progress Notes (Signed)
[facility]                                                                              Patient Demographics  Joseph Manning, is a 65 y.o. male, DOB - [birth year], Joseph Manning:[id number]  Admit date - 01/31/2016   Admitting Physician [name], MD  Outpatient Primary MD for the patient is [name], MD  LOS -    Chief Complaint  Patient presents with  . Abdominal Pain      HPI on 01/31/2016 by Dr. Gean Birchwood [name] is a 65 y.o. male history of COPD, diabetes mellitus type 2 presents to the ER because of right-sided chest pain. Patient has been having right-sided chest pain for last few days. Patient's pain radiates to the right flank area. In the ER chest x-ray and sonogram of the abdomen was unremarkable. EKG was showing normal sinus rhythm. Troponin is mildly elevated. Patient denies any shortness of breath. Patient has some productive cough which has been chronic. Patient has had pneumonia in the right side requiring thoracentesis last year. Personally patient is afebrile. Denies any nausea vomiting or diarrhea. Denies any skin rash. On exam patient's abdomen appears benign. Patient has been admitted for further management given patient's elevated troponin. ER PA did discuss the on call cardiologist Dr. Colon Flattery. Heparin was started by the ER physician subsequently.  Assessment & Plan   Chest pain/right-sided upper chest pain/elevated troponin -Troponin 0.16, 0.17, continue to cycle -Cardiology consulted and appreciated  -Continue IV heparin -Pending lipid panel -Plan for nuclear study today at [facility] -Continue aspirin and plavix -CTA chest showed no evidence of pulmonary emboli  Diabetes mellitus, type II  -Continue into place until CBG monitoring   COPD -Continue home medications -Currently no wheezing  Chronic kidney disease, stage III -Creatinine 1.3, appears to be stable -Monitor BMP  Abdominal pain  -Ultrasound of the abdomen: Hepatic  steatosis, unremarkable appearance of the gallbladder -Continue to monitor   Code Status: Full  Family Communication: None at bedside  Disposition Plan: heparin  Time Spent in minutes   30 minutes  Procedures  None  Consults   Cardiology  DVT Prophylaxis  heparin  Lab Results  Component Value Date   PLT 367 01/31/2016    Medications  Scheduled Meds: . aspirin EC  325 mg Oral Daily  . clopidogrel  75 mg Oral Daily  . diazepam  10 mg Oral TID  . guaiFENesin  600 mg Oral BID  . HYDROcodone-acetaminophen  1-2 tablet Oral TID  . insulin aspart  0-9 Units Subcutaneous TID WC  . insulin aspart protamine - aspart  25 Units Subcutaneous BID WC  . mometasone-formoterol  2 puff Inhalation BID  . tiotropium  1 capsule Inhalation Daily   Continuous Infusions: . heparin 1,500 Units/hr (02/01/16 0506)   PRN Meds:.acetaminophen, albuterol, cyclobenzaprine, morphine injection, ondansetron (ZOFRAN) IV  Antibiotics    Anti-infectives    None     Subjective:   Joseph Manning seen and examined today. Continues to complain of abdominal pain. Denies chest pain or shortness of breath, nausea or vomiting. Would like to eat.  Objective:   Filed Vitals:   01/31/16 2011 01/31/16  2204 02/01/16 0141 02/01/16 0532  BP:  130/68 100/48 128/50  Pulse:  94 100 89  Temp:  97.6 F (36.4 C) 98 F (36.7 C) 99.7 F (37.6 C)  TempSrc:  Oral Oral Oral  Resp:  20 20 20   Height:  5\' 9"  (1.753 m)    Weight: 104.072 kg (229 lb 7 oz) 101.3 kg (223 lb 5.2 oz)    SpO2:  100% 97% 98%    Wt Readings from Last 3 Encounters:  01/31/16 101.3 kg (223 lb 5.2 oz)  12/27/15 103.42 kg (228 lb)  05/01/15 108.863 kg (240 lb)     Intake/Output Summary (Last 24 hours) at 02/01/16 1201 Last data filed at 02/01/16 0700  Gross per 24 hour  Intake 135.38 ml  Output   1000 ml  Net -864.62 ml    Exam  General: Well developed, well nourished, NAD, appears stated age  HEENT: NCAT, mucous membranes  moist.   Cardiovascular: S1 S2 auscultated, no rubs, murmurs or gallops. Regular rate and rhythm.  Respiratory: Clear to auscultation bilaterally with equal chest rise  Abdomen: Soft, nontender, nondistended, + bowel sounds  Extremities: warm dry without cyanosis clubbing or edema  Neuro: AAOx3, nonfocal, heard of hearing  Psych: Normal affect and demeanor   Data Review   Micro Results No results found for this or any previous visit (from the past 240 hour(s)).  Radiology Reports Dg Chest 2 View  01/31/2016  CLINICAL DATA:  Abdominal pain and cough for 3 days EXAM: CHEST  2 VIEW COMPARISON:  01/16/15 FINDINGS: The heart size and vascular pattern are normal. Lungs are clear. There is no pleural effusion. There is no residual infiltrate. IMPRESSION: Negative Electronically Signed   By: Skipper Cliche M.D.   On: 01/31/2016 17:19   Ct Angio Chest Pe W/cm &/or Wo Cm  02/01/2016  CLINICAL DATA:  Right-sided chest pain with productive cough EXAM: CT ANGIOGRAPHY CHEST WITH CONTRAST TECHNIQUE: Multidetector CT imaging of the chest was performed using the standard protocol during bolus administration of intravenous contrast. Multiplanar CT image reconstructions and MIPs were obtained to evaluate the vascular anatomy. CONTRAST:  182mL OMNIPAQUE IOHEXOL 350 MG/ML SOLN COMPARISON:  None. FINDINGS: The lungs are well aerated bilaterally. No focal confluent infiltrate or sizable effusion is seen. Diffuse bronchial thickening is noted consistent with bronchitis. No focal mass lesion is noted. The thoracic inlet shows the thoracic aorta and its branches to be within normal limits. The pulmonary artery shows a normal branching pattern. No filling defects to suggest pulmonary emboli are noted. No significant hilar or mediastinal adenopathy is seen. The visualized upper abdomen is within normal limits. The bony structures show no acute abnormality. Review of the MIP images confirms the above findings. IMPRESSION:  No evidence of pulmonary emboli. Diffuse bronchial thickening likely related to bronchitis. No focal infiltrate is seen. Electronically Signed   By: Inez Catalina M.D.   On: 02/01/2016 08:53   US Abdomen Complete  01/31/2016  CLINICAL DATA:  Right upper quadrant/ epigastric pain. EXAM: ABDOMEN ULTRASOUND COMPLETE COMPARISON:  01/01/2015 right upper quadrant ultrasound. 01/01/2015 CT abdomen and pelvis. FINDINGS: Gallbladder: No gallstones or wall thickening visualized. No sonographic Murphy sign noted by sonographer. Common bile duct: Diameter: 6 mm Liver: Coarse, echogenic liver compatible with steatosis as previously seen. Fatty sparing near the gallbladder. IVC: No abnormality visualized. Pancreas: Visualized portion unremarkable. Spleen: Size and appearance within normal limits. Right Kidney: Length: 12.0 cm. Echogenicity within normal limits. No mass or hydronephrosis visualized.  Left Kidney: Length: 11.3 cm. Echogenicity within normal limits. No mass or hydronephrosis visualized. Abdominal aorta: No aneurysm visualized. Other findings: None. IMPRESSION: 1. Hepatic steatosis. 2. Unremarkable appearance of the gallbladder. Electronically Signed   By: Logan Bores M.D.   On: 01/31/2016 19:51    CBC  Recent Labs Lab 01/31/16 1613  WBC 5.7  HGB 13.7  HCT 42.7  PLT 367  MCV 95.1  MCH 30.5  MCHC 32.1  RDW 13.5    Chemistries   Recent Labs Lab 01/31/16 1613  NA 139  K 5.1  CL 101  CO2 26  GLUCOSE 375*  BUN 30*  CREATININE 1.30*  CALCIUM 9.0  AST 16  ALT 15*  ALKPHOS 81  BILITOT 0.3   ------------------------------------------------------------------------------------------------------------------ estimated creatinine clearance is 66.4 mL/min (by C-G formula based on Cr of 1.3). ------------------------------------------------------------------------------------------------------------------ No results for input(s): HGBA1C in the last 72  hours. ------------------------------------------------------------------------------------------------------------------ No results for input(s): CHOL, HDL, LDLCALC, TRIG, CHOLHDL, LDLDIRECT in the last 72 hours. ------------------------------------------------------------------------------------------------------------------ No results for input(s): TSH, T4TOTAL, T3FREE, THYROIDAB in the last 72 hours.  Invalid input(s): FREET3 ------------------------------------------------------------------------------------------------------------------ No results for input(s): VITAMINB12, FOLATE, FERRITIN, TIBC, IRON, RETICCTPCT in the last 72 hours.  Coagulation profile  Recent Labs Lab 01/31/16 2015  INR 1.06     Recent Labs  01/31/16 1612  DDIMER 0.27    Cardiac Enzymes  Recent Labs Lab 01/31/16 2320 02/01/16 0513 02/01/16 1024  TROPONINI 0.16* 0.17* 0.16*   ------------------------------------------------------------------------------------------------------------------ Invalid input(s): POCBNP    Kizzi Overbey D.O. on 02/01/2016 at 12:01 PM  Between 7am to 7pm - Pager - [phone]  After 7pm go to [email or web address] - password [email or web address]  And look for the night coverage person covering for me after hours  [facility] Office  [phone]

## 2016-02-01 NOTE — Care Management Note (Signed)
Case Management Note  Patient Details  Name: Joseph Manning MRN: [id number] Date of Birth: 1951/09/14  Subjective/Objective:  65 y/o m admitted w/chest pain. From home. Spoke to dtr [place] since patient was sleeping-she states they live together, & home is safe.                   Action/Plan:d/c plan home.   Expected Discharge Date:   (unknown)               Expected Discharge Plan:  Home/Self Care  In-House Referral:     Discharge planning Services  CM Consult  Post Acute Care Choice:    Choice offered to:     DME Arranged:    DME Agency:     HH Arranged:    HH Agency:     Status of Service:  In process, will continue to follow  Medicare Important Message Given:    Date Medicare IM Given:    Medicare IM give by:    Date Additional Medicare IM Given:    Additional Medicare Important Message give by:     If discussed at [place] of Stay Meetings, dates discussed:    Additional Comments:  Dessa Phi, RN 02/01/2016, 11:27 AM

## 2016-02-01 NOTE — Progress Notes (Signed)
ANTICOAGULATION CONSULT NOTE - F/u Consult  Pharmacy Consult for Heparin Indication: chest pain/ACS  Allergies  Allergen Reactions  . Fentanyl Other (See Comments)    unknown  . Versed [Midazolam] Other (See Comments)    unknown    Patient Measurements: Height: 5\' 9"  (175.3 cm) Weight: 223 lb 5.2 oz (101.3 kg) IBW/kg (Calculated) : 70.7 Heparin Dosing Weight: 92.8kg  Vital Signs: Temp: 98 F (36.7 C) (03/24 0141) Temp Source: Oral (03/24 0141) BP: 100/48 mmHg (03/24 0141) Pulse Rate: 100 (03/24 0141)  Labs:  Recent Labs  01/31/16 1612 01/31/16 1613 01/31/16 2015 01/31/16 2320 02/01/16 0302  HGB  --  13.7  --   --   --   HCT  --  42.7  --   --   --   PLT  --  367  --   --   --   APTT  --   --  28  --   --   LABPROT  --   --  13.6  --   --   INR  --   --  1.06  --   --   HEPARINUNFRC  --   --   --   --  0.24*  CREATININE  --  1.30*  --   --   --   TROPONINI 0.17*  --   --  0.16*  --     Estimated Creatinine Clearance: 66.4 mL/min (by C-G formula based on Cr of 1.3).   Medical History: Past Medical History  Diagnosis Date  . Diabetes mellitus without complication ([place])   . Hypertension   . Depression   . COPD (chronic obstructive pulmonary disease) ([place])   . Tubular adenoma of colon 12/1999  . Anxiety   . Arthritis     Medications:  Prescriptions prior to admission  Medication Sig Dispense Refill Last Dose  . albuterol (PROVENTIL HFA;VENTOLIN HFA) 108 (90 BASE) MCG/ACT inhaler Inhale 2 puffs into the lungs every 6 (six) hours as needed for wheezing or shortness of breath. 1 Inhaler 2 01/31/2016 at Unknown time  . cyclobenzaprine (FLEXERIL) 10 MG tablet Take 10 mg by mouth 2 (two) times daily as needed. for pain  3 Unknown  . diazepam (VALIUM) 10 MG tablet Take 10 mg by mouth 3 (three) times daily.    01/31/2016 at Unknown time  . HYDROcodone-acetaminophen (NORCO/VICODIN) 5-325 MG per tablet Take 1-2 tablets by mouth 3 (three) times daily. He takes two  tablets in the morning and evening and one tablet midday.  0 01/31/2016 at Unknown time  . NOVOLOG MIX 70/30 FLEXPEN (70-30) 100 UNIT/ML FlexPen Inject 45-50 Units as directed See admin instructions. Inject 45 units every morning and 50 units every evening.  3 01/31/2016 at Unknown time  . SPIRIVA HANDIHALER 18 MCG inhalation capsule Place 1 capsule into inhaler and inhale daily.  11 Unknown  . SYMBICORT 160-4.5 MCG/ACT inhaler Inhale 1 puff into the lungs 2 (two) times daily.  6 Unknown  . Aspirin-Acetaminophen-Caffeine (GOODY HEADACHE PO) Take 1 packet by mouth every 6 (six) hours as needed (for pain or headache).    Taking  . guaiFENesin (MUCINEX) 600 MG 12 hr tablet Take 1 tablet (600 mg total) by mouth 2 (two) times daily. (Patient not taking: Reported on 01/31/2016) 60 tablet 1 Taking  . insulin NPH-regular Human (NOVOLIN 70/30) (70-30) 100 UNIT/ML injection 50 units in am and 50 units just before supper every day (Patient not taking: Reported on 01/31/2016) 20 mL 11   .  pantoprazole (PROTONIX) 40 MG tablet Take 1 tablet (40 mg total) by mouth 2 (two) times daily. (Patient not taking: Reported on 01/31/2016) 60 tablet 3 Taking   Scheduled:  Infusions:   Assessment: 65yo M c/o right upper and lower abdominal pain x 2 days with nausea and vomiting. Troponin is elevated. Pharmacy is asked to dose heparin.  Today, 3/24  0302 HL=0.24, no problems with IV infusion or bleeding per RN Goal of Therapy:  Heparin level 0.3-0.7 units/ml Monitor platelets by anticoagulation protocol: Yes   Plan:  Increase heparin drip to 1500 units/hr Check heparin level in 6hrs and daily thereafter. Check CBC q24h while on heparin. F/u daily.  Lawana Pai R  02/01/2016,4:20 AM.

## 2016-02-01 NOTE — Progress Notes (Signed)
Pt's nuc study without ischemia and normal wall motion.  EF 54% - discussed with Dr. Oval Linsey and I stopped plavix and Heparin.  RN will notify pt and I notified Dr. Ree Kida.  Demand ischemia  From severe pain.

## 2016-02-02 ENCOUNTER — Observation Stay (HOSPITAL_BASED_OUTPATIENT_CLINIC_OR_DEPARTMENT_OTHER): Payer: Medicare Other

## 2016-02-02 DIAGNOSIS — R0781 Pleurodynia: Secondary | ICD-10-CM | POA: Diagnosis not present

## 2016-02-02 DIAGNOSIS — I10 Essential (primary) hypertension: Secondary | ICD-10-CM | POA: Diagnosis not present

## 2016-02-02 DIAGNOSIS — R7989 Other specified abnormal findings of blood chemistry: Secondary | ICD-10-CM | POA: Diagnosis not present

## 2016-02-02 DIAGNOSIS — R079 Chest pain, unspecified: Secondary | ICD-10-CM

## 2016-02-02 DIAGNOSIS — J449 Chronic obstructive pulmonary disease, unspecified: Secondary | ICD-10-CM | POA: Diagnosis not present

## 2016-02-02 LAB — BASIC METABOLIC PANEL
Anion gap: 9 (ref 5–15)
BUN: 22 mg/dL — ABNORMAL HIGH (ref 6–20)
CHLORIDE: 102 mmol/L (ref 101–111)
CO2: 29 mmol/L (ref 22–32)
CREATININE: 1.1 mg/dL (ref 0.61–1.24)
Calcium: 8.5 mg/dL — ABNORMAL LOW (ref 8.9–10.3)
Glucose, Bld: 170 mg/dL — ABNORMAL HIGH (ref 65–99)
POTASSIUM: 4.4 mmol/L (ref 3.5–5.1)
SODIUM: 140 mmol/L (ref 135–145)

## 2016-02-02 LAB — GLUCOSE, CAPILLARY
Glucose-Capillary: 220 mg/dL — ABNORMAL HIGH (ref 65–99)
Glucose-Capillary: 102 mg/dL — ABNORMAL HIGH (ref 65–99)

## 2016-02-02 LAB — ECHOCARDIOGRAM COMPLETE
Height: 69 in
Weight: 3573.22 oz

## 2016-02-02 MED ORDER — PANTOPRAZOLE SODIUM 40 MG PO TBEC: 40.0000 mg | DELAYED_RELEASE_TABLET | Freq: Two times a day (BID) | ORAL | Status: AC

## 2016-02-02 MED ORDER — BENZONATATE 100 MG PO CAPS: 100.0000 mg | ORAL_CAPSULE | Freq: Two times a day (BID) | ORAL | Status: DC | PRN

## 2016-02-02 MED ORDER — ATORVASTATIN CALCIUM 20 MG PO TABS: 20.0000 mg | ORAL_TABLET | Freq: Every day | ORAL | Status: DC

## 2016-02-02 MED ORDER — PANTOPRAZOLE SODIUM 40 MG PO TBEC
40.0000 mg | DELAYED_RELEASE_TABLET | Freq: Two times a day (BID) | ORAL | Status: DC
  Administered 2016-02-02: 40 mg via ORAL
  Filled 2016-02-02 (×2): qty 1

## 2016-02-02 MED ORDER — ASPIRIN 81 MG PO TBEC: 81.0000 mg | DELAYED_RELEASE_TABLET | Freq: Every day | ORAL | Status: AC

## 2016-02-02 NOTE — Discharge Instructions (Signed)

## 2016-02-02 NOTE — Discharge Summary (Signed)
Physician Discharge Summary  Joseph Manning [id number] DOB: Mar 14, 1951 DOA: 01/31/2016  PCP: [name], MD  Admit date: 01/31/2016 Discharge date: 02/02/2016  Time spent: 45 minutes  Recommendations for Outpatient Follow-up:  Patient will be discharged to home.  Patient will need to follow up with primary care provider within one week of discharge.  Patient should continue medications as prescribed.  Patient should follow a Heart healthy/carb modified diet.   Discharge Diagnoses:  Chest pain/right-sided upper chest pain/elevated troponin Diabetes mellitus, type II COPD Chronic kidney disease, stage III Abdominal pain  Discharge Condition: Stable  Diet recommendation: Heart healthy/carb modified  Filed Weights   01/31/16 2011 01/31/16 2204  Weight: 104.072 kg (229 lb 7 oz) 101.3 kg (223 lb 5.2 oz)    History of present illness:  on 01/31/2016 by Dr. Gean Birchwood Joseph Manning is a 64 y.o. male history of COPD, diabetes mellitus type 2 presents to the ER because of right-sided chest pain. Patient has been having right-sided chest pain for last few days. Patient's pain radiates to the right flank area. In the ER chest x-ray and sonogram of the abdomen was unremarkable. EKG was showing normal sinus rhythm. Troponin is mildly elevated. Patient denies any shortness of breath. Patient has some productive cough which has been chronic. Patient has had pneumonia in the right side requiring thoracentesis last year. Personally patient is afebrile. Denies any nausea vomiting or diarrhea. Denies any skin rash. On exam patient's abdomen appears benign. Patient has been admitted for further management given patient's elevated troponin. ER PA did discuss the on call cardiologist Dr. Colon Flattery. Heparin was started by the ER physician subsequently.  Hospital Course:  Chest pain/right-sided upper chest pain/elevated troponin -Troponin 0.16, 0.17, continue to cycle -Cardiology consulted  and appreciated  -Initially placed on IV heparin and plavix, discontinued as lexiscan showed EF 54%, low risk study. Heparin and plavix discontinued -Lipid profile: TC 205, TG 170, HDL 38, LDL 133 -Continue aspirin -CTA chest showed no evidence of pulmonary emboli -Will start on low dose statin -echocardiogram: EF 123456, grade 2 diastolic dysfunction  Diabetes mellitus, type II  -Continue home regimen at discharge  COPD -Continue home medications -Currently no wheezing  Chronic kidney disease, stage III -Creatinine 1.1, appears to be stable  Abdominal pain  -Ultrasound of the abdomen: Hepatic steatosis, unremarkable appearance of the gallbladder -LFTs and lipase normal upon admisson -Patient able to tolerate diet -Was supposed to be taking protonix 40mg  BID, however patient was not -patient had duodenitis on EGD in 2014 done by Dr. Ardis Hughs -Spoke with Dr. Fuller Plan, GI, recommended restarting PPI BID, avoiding NSAIDs. Would not do another EGD at this time  Procedures  [facility], Dr. Fuller Plan, via phone  Discharge Exam: Filed Vitals:   02/02/16 0633 02/02/16 1035  BP: 122/61 128/62  Pulse: 87 86  Temp: 99.5 F (37.5 C) 98.7 F (37.1 C)  Resp: 18 18    Exam  General: Well developed, well nourished, NAD, appears stated age  HEENT: NCAT, mucous membranes moist.   Cardiovascular: S1 S2 auscultated, no rubs, murmurs or gallops. Regular rate and rhythm.  Respiratory: Clear to auscultation bilaterally with equal chest rise  Abdomen: Soft, nontender, nondistended, + bowel sounds  Extremities: warm dry without cyanosis clubbing or edema  Neuro: AAOx3, nonfocal, heard of hearing  Psych: Normal affect and demeanor  Discharge Instructions      Discharge Instructions    Discharge instructions    Complete by:  As directed   Patient will be discharged to home.  Patient will need to follow up with primary care  provider within one week of discharge.  Patient should continue medications as prescribed.  Patient should follow a Heart healthy/carb modified diet.            Medication List    STOP taking these medications        GOODY HEADACHE PO     insulin NPH-regular Human (70-30) 100 UNIT/ML injection  Commonly known as:  NOVOLIN 70/30      TAKE these medications        albuterol 108 (90 Base) MCG/ACT inhaler  Commonly known as:  PROVENTIL HFA;VENTOLIN HFA  Inhale 2 puffs into the lungs every 6 (six) hours as needed for wheezing or shortness of breath.     aspirin 81 MG EC tablet  Take 1 tablet (81 mg total) by mouth daily.     atorvastatin 20 MG tablet  Commonly known as:  LIPITOR  Take 1 tablet (20 mg total) by mouth daily.     cyclobenzaprine 10 MG tablet  Commonly known as:  FLEXERIL  Take 10 mg by mouth 2 (two) times daily as needed. for pain     diazepam 10 MG tablet  Commonly known as:  VALIUM  Take 10 mg by mouth 3 (three) times daily.     guaiFENesin 600 MG 12 hr tablet  Commonly known as:  MUCINEX  Take 1 tablet (600 mg total) by mouth 2 (two) times daily.     HYDROcodone-acetaminophen 5-325 MG tablet  Commonly known as:  NORCO/VICODIN  Take 1-2 tablets by mouth 3 (three) times daily. He takes two tablets in the morning and evening and one tablet midday.     NOVOLOG MIX 70/30 FLEXPEN (70-30) 100 UNIT/ML FlexPen  Generic drug:  insulin aspart protamine - aspart  Inject 45-50 Units as directed See admin instructions. Inject 45 units every morning and 50 units every evening.     pantoprazole 40 MG tablet  Commonly known as:  PROTONIX  Take 1 tablet (40 mg total) by mouth 2 (two) times daily.     SPIRIVA HANDIHALER 18 MCG inhalation capsule  Generic drug:  tiotropium  Place 1 capsule into inhaler and inhale daily.     SYMBICORT 160-4.5 MCG/ACT inhaler  Generic drug:  budesonide-formoterol  Inhale 1 puff into the lungs 2 (two) times daily.       Allergies    Allergen Reactions  . Fentanyl Other (See Comments)    unknown  . Versed [Midazolam] Other (See Comments)    unknown   Follow-up Information    Follow up with [name], MD. Schedule an appointment as soon as possible for a visit in 1 week.   Specialty:  Internal Medicine   Why:  Hospital follow up   Contact information:   [address] [place] [address] [phone]        The results of significant diagnostics from this hospitalization (including imaging, microbiology, ancillary and laboratory) are listed below for reference.    Significant Diagnostic Studies: Dg Chest 2 View  01/31/2016  CLINICAL DATA:  Abdominal pain and cough for 3 days EXAM: CHEST  2 VIEW COMPARISON:  01/16/15 FINDINGS: The heart size and vascular pattern are normal. Lungs are clear. There is no pleural effusion. There is no residual infiltrate. IMPRESSION: Negative Electronically Signed   By: Skipper Cliche M.D.   On: 01/31/2016 17:19   Ct Angio Chest  Pe W/cm &/or Wo Cm  02/01/2016  CLINICAL DATA:  Right-sided chest pain with productive cough EXAM: CT ANGIOGRAPHY CHEST WITH CONTRAST TECHNIQUE: Multidetector CT imaging of the chest was performed using the standard protocol during bolus administration of intravenous contrast. Multiplanar CT image reconstructions and MIPs were obtained to evaluate the vascular anatomy. CONTRAST:  128mL OMNIPAQUE IOHEXOL 350 MG/ML SOLN COMPARISON:  None. FINDINGS: The lungs are well aerated bilaterally. No focal confluent infiltrate or sizable effusion is seen. Diffuse bronchial thickening is noted consistent with bronchitis. No focal mass lesion is noted. The thoracic inlet shows the thoracic aorta and its branches to be within normal limits. The pulmonary artery shows a normal branching pattern. No filling defects to suggest pulmonary emboli are noted. No significant hilar or mediastinal adenopathy is seen. The visualized upper abdomen is within normal limits. The bony  structures show no acute abnormality. Review of the MIP images confirms the above findings. IMPRESSION: No evidence of pulmonary emboli. Diffuse bronchial thickening likely related to bronchitis. No focal infiltrate is seen. Electronically Signed   By: Inez Catalina M.D.   On: 02/01/2016 08:53   US Abdomen Complete  01/31/2016  CLINICAL DATA:  Right upper quadrant/ epigastric pain. EXAM: ABDOMEN ULTRASOUND COMPLETE COMPARISON:  01/01/2015 right upper quadrant ultrasound. 01/01/2015 CT abdomen and pelvis. FINDINGS: Gallbladder: No gallstones or wall thickening visualized. No sonographic Murphy sign noted by sonographer. Common bile duct: Diameter: 6 mm Liver: Coarse, echogenic liver compatible with steatosis as previously seen. Fatty sparing near the gallbladder. IVC: No abnormality visualized. Pancreas: Visualized portion unremarkable. Spleen: Size and appearance within normal limits. Right Kidney: Length: 12.0 cm. Echogenicity within normal limits. No mass or hydronephrosis visualized. Left Kidney: Length: 11.3 cm. Echogenicity within normal limits. No mass or hydronephrosis visualized. Abdominal aorta: No aneurysm visualized. Other findings: None. IMPRESSION: 1. Hepatic steatosis. 2. Unremarkable appearance of the gallbladder. Electronically Signed   By: Logan Bores M.D.   On: 01/31/2016 19:51   Nm Myocar Multi W/spect W/wall Motion / Ef  02/01/2016  CLINICAL DATA:  66 year old male with cardiac risk factors including smoking, diabetes, and hypertension, presenting with several day history of right-sided chest pain and shortness of breath associated with EXAM: MYOCARDIAL IMAGING WITH SPECT (REST AND PHARMACOLOGIC-STRESS) GATED LEFT VENTRICULAR WALL MOTION STUDY LEFT VENTRICULAR EJECTION FRACTION TECHNIQUE: Standard myocardial SPECT imaging was performed after resting intravenous injection of 10 mCi Tc-38m sestamibi. Subsequently, intravenous infusion of Lexiscan was performed under the supervision of the  Cardiology staff. At peak effect of the drug, 30 mCi Tc-44m sestamibi was injected intravenously and standard myocardial SPECT imaging was performed. Quantitative gated imaging was also performed to evaluate left ventricular wall motion, and estimate left ventricular ejection fraction. COMPARISON:  None. FINDINGS: Perfusion: Slight diminished activity in the apex and inferior wall relative to the remainder of the myocardium on the post regadenoson images without evidence of reversibility on the resting images. This can be explained on the basis of diaphragmatic attenuation. Wall Motion: Normal left ventricular wall motion. No left ventricular dilation. Left Ventricular Ejection Fraction: [id number] End diastolic volume [id number] ml End systolic volume 50 ml IMPRESSION: 1. No reversible ischemia or infarction. 2. Normal left ventricular wall motion. 3. Left ventricular ejection fraction 54% 4. Low-risk stress test findings*. *2012 Appropriate Use Criteria for Coronary Revascularization Focused Update: J Am Coll Cardiol. [id number]. http://content.airportbarriers.com.aspx?articleid=[email or web address] Electronically Signed   By: Evangeline Dakin M.D.   On: 02/01/2016 15:45    Microbiology: No results found for this or any previous  visit (from the past 240 hour(s)).   Labs: Basic Metabolic Panel:  Recent Labs Lab 01/31/16 1613 02/02/16 0508  NA 139 140  K 5.1 4.4  CL 101 102  CO2 26 29  GLUCOSE 375* 170*  BUN 30* 22*  CREATININE 1.30* 1.10  CALCIUM 9.0 8.5*   Liver Function Tests:  Recent Labs Lab 01/31/16 1613  AST 16  ALT 15*  ALKPHOS 81  BILITOT 0.3  PROT 7.5  ALBUMIN 4.0    Recent Labs Lab 01/31/16 1613  LIPASE 32   No results for input(s): AMMONIA in the last 168 hours. CBC:  Recent Labs Lab 01/31/16 1613 02/01/16 1803  WBC 5.7 7.5  HGB 13.7 13.4  HCT 42.7 40.4  MCV 95.1 94.8  PLT 367 326   Cardiac Enzymes:  Recent Labs Lab 01/31/16 1612 01/31/16 2320 02/01/16 0513  02/01/16 1024  TROPONINI 0.17* 0.16* 0.17* 0.16*   BNP: BNP (last 3 results)  Recent Labs  01/31/16 1613  BNP 28.5    ProBNP (last 3 results) No results for input(s): PROBNP in the last 8760 hours.  CBG:  Recent Labs Lab 02/01/16 1206 02/01/16 1649 02/01/16 2116 02/02/16 0739 02/02/16 1207  GLUCAP 129* 182* 144* 220* 102*       Signed:  Garry Bochicchio  [facility] 02/02/2016, 2:16 PM

## 2016-02-02 NOTE — Progress Notes (Signed)
*  PRELIMINARY RESULTS* Echocardiogram 2D Echocardiogram has been performed.  Leavy Cella 02/02/2016, 1:35 PM

## 2016-02-02 NOTE — Progress Notes (Signed)
Received call from [place] reporting pt in VF;VT. Pt had one lead off. Secured lead, assessed pt, noted to be asymptomatic in NSR.

## 2016-03-03 ENCOUNTER — Emergency Department (HOSPITAL_COMMUNITY)
Admission: EM | Admit: 2016-03-03 | Discharge: 2016-03-04 | Disposition: A | Discharge status: Home / Self Care | Payer: Medicare Other | Attending: Emergency Medicine | Admitting: Emergency Medicine

## 2016-03-03 DIAGNOSIS — Z7982 Long term (current) use of aspirin: Secondary | ICD-10-CM | POA: Insufficient documentation

## 2016-03-03 DIAGNOSIS — Z86018 Personal history of other benign neoplasm: Secondary | ICD-10-CM | POA: Diagnosis not present

## 2016-03-03 DIAGNOSIS — J441 Chronic obstructive pulmonary disease with (acute) exacerbation: Secondary | ICD-10-CM | POA: Insufficient documentation

## 2016-03-03 DIAGNOSIS — K644 Residual hemorrhoidal skin tags: Secondary | ICD-10-CM

## 2016-03-03 DIAGNOSIS — L02411 Cutaneous abscess of right axilla: Secondary | ICD-10-CM

## 2016-03-03 DIAGNOSIS — Z8659 Personal history of other mental and behavioral disorders: Secondary | ICD-10-CM | POA: Diagnosis not present

## 2016-03-03 DIAGNOSIS — F17210 Nicotine dependence, cigarettes, uncomplicated: Secondary | ICD-10-CM | POA: Diagnosis not present

## 2016-03-03 DIAGNOSIS — E119 Type 2 diabetes mellitus without complications: Secondary | ICD-10-CM | POA: Diagnosis not present

## 2016-03-03 DIAGNOSIS — R109 Unspecified abdominal pain: Secondary | ICD-10-CM | POA: Diagnosis present

## 2016-03-03 DIAGNOSIS — I10 Essential (primary) hypertension: Secondary | ICD-10-CM | POA: Diagnosis not present

## 2016-03-03 DIAGNOSIS — M1990 Unspecified osteoarthritis, unspecified site: Secondary | ICD-10-CM | POA: Insufficient documentation

## 2016-03-03 DIAGNOSIS — Z79899 Other long term (current) drug therapy: Secondary | ICD-10-CM | POA: Insufficient documentation

## 2016-03-03 DIAGNOSIS — K5900 Constipation, unspecified: Secondary | ICD-10-CM | POA: Insufficient documentation

## 2016-03-04 ENCOUNTER — Encounter (HOSPITAL_COMMUNITY): Payer: Self-pay

## 2016-03-04 DIAGNOSIS — K5900 Constipation, unspecified: Secondary | ICD-10-CM | POA: Diagnosis not present

## 2016-03-04 LAB — COMPREHENSIVE METABOLIC PANEL
ALBUMIN: 3.8 g/dL (ref 3.5–5.0)
ALT: 10 U/L — ABNORMAL LOW (ref 17–63)
ANION GAP: 11 (ref 5–15)
AST: 12 U/L — ABNORMAL LOW (ref 15–41)
Alkaline Phosphatase: 64 U/L (ref 38–126)
BILIRUBIN TOTAL: 0.5 mg/dL (ref 0.3–1.2)
BUN: 27 mg/dL — ABNORMAL HIGH (ref 6–20)
CO2: 21 mmol/L — ABNORMAL LOW (ref 22–32)
Calcium: 9.3 mg/dL (ref 8.9–10.3)
Chloride: 110 mmol/L (ref 101–111)
Creatinine, Ser: 1.07 mg/dL (ref 0.61–1.24)
GFR calc non Af Amer: >60 mL/min (ref >60)
GLUCOSE: 125 mg/dL — AB (ref 65–99)
POTASSIUM: 4.2 mmol/L (ref 3.5–5.1)
SODIUM: 142 mmol/L (ref 135–145)
TOTAL PROTEIN: 7.5 g/dL (ref 6.5–8.1)

## 2016-03-04 LAB — CBC
HEMATOCRIT: 39.8 % (ref 39.0–52.0)
HEMOGLOBIN: 13.2 g/dL (ref 13.0–17.0)
MCH: 30.8 pg (ref 26.0–34.0)
MCHC: 33.2 g/dL (ref 30.0–36.0)
MCV: 92.8 fL (ref 78.0–100.0)
Platelets: 422 10*3/uL — ABNORMAL HIGH (ref 150–400)
RBC: 4.29 MIL/uL (ref 4.22–5.81)
RDW: 13.5 % (ref 11.5–15.5)
WBC: 14.3 10*3/uL — ABNORMAL HIGH (ref 4.0–10.5)

## 2016-03-04 LAB — POC OCCULT BLOOD, ED: FECAL OCCULT BLD: POSITIVE — AB

## 2016-03-04 LAB — TYPE AND SCREEN
ABO/RH(D): O POS
ANTIBODY SCREEN: NEGATIVE

## 2016-03-04 LAB — ABO/RH: ABO/RH(D): O POS

## 2016-03-04 MED ORDER — LIDOCAINE-EPINEPHRINE 1 %-1:100000 IJ SOLN
10.0000 mL | Freq: Once | INTRAMUSCULAR | Status: AC
  Administered 2016-03-04: 10 mL
  Filled 2016-03-04: qty 1

## 2016-03-04 MED ORDER — HYDROCORTISONE 2.5 % RE CREA: TOPICAL_CREAM | RECTAL | Status: DC

## 2016-03-04 MED ORDER — POLYETHYLENE GLYCOL 3350 17 GM/SCOOP PO POWD: ORAL | Status: DC

## 2016-03-04 MED ORDER — MAGNESIUM CITRATE PO SOLN
1.0000 | Freq: Once | ORAL | Status: AC
  Administered 2016-03-04: 1 via ORAL
  Filled 2016-03-04: qty 296

## 2016-03-04 NOTE — ED Notes (Signed)
Pt states that he already takes stool softeners

## 2016-03-04 NOTE — ED Notes (Signed)
Pt has swelling to right armpit; states sometimes his "boil swells up"

## 2016-03-04 NOTE — ED Notes (Signed)
Pt ambulated to bathroom 

## 2016-03-04 NOTE — Discharge Instructions (Signed)
Constipation, Adult Constipation is when a person:  Poops (has a bowel movement) less than 3 times a week.  Has a hard time pooping.  Has poop that is dry, hard, or bigger than normal. HOME CARE   Eat foods with a lot of fiber in them. This includes fruits, vegetables, beans, and whole grains such as brown rice.  Avoid fatty foods and foods with a lot of sugar. This includes french fries, hamburgers, cookies, candy, and soda.  If you are not getting enough fiber from food, take products with added fiber in them (supplements).  Drink enough fluid to keep your pee (urine) clear or pale yellow.  Exercise on a regular basis, or as told by your doctor.  Go to the restroom when you feel like you need to poop. Do not hold it.  Only take medicine as told by your doctor. Do not take medicines that help you poop (laxatives) without talking to your doctor first. GET HELP RIGHT AWAY IF:   You have bright red blood in your poop (stool).  Your constipation lasts more than 4 days or gets worse.  You have belly (abdominal) or butt (rectal) pain.  You have thin poop (as thin as a pencil).  You lose weight, and it cannot be explained. MAKE SURE YOU:   Understand these instructions.  Will watch your condition.  Will get help right away if you are not doing well or get worse.   This information is not intended to replace advice given to you by your health care provider. Make sure you discuss any questions you have with your health care provider.   Document Released: 04/14/2008 Document Revised: 11/17/2014 Document Reviewed: 08/08/2013 Elsevier Interactive Patient Education 2016 Else[employer]  Abscess An abscess (boil or furuncle) is an infected area on or under the skin. This area is filled with yellowish-white fluid (pus) and other material (debris). HOME CARE   Only take medicines as told by your doctor.  If you were given antibiotic medicine, take it as directed. Finish the  medicine even if you start to feel better.  If gauze is used, follow your doctor's directions for changing the gauze.  To avoid spreading the infection:  Keep your abscess covered with a bandage.  Wash your hands well.  Do not share personal care items, towels, or whirlpools with others.  Avoid skin contact with others.  Keep your skin and clothes clean around the abscess.  Keep all doctor visits as told. GET HELP RIGHT AWAY IF:   You have more pain, puffiness (swelling), or redness in the wound site.  You have more fluid or blood coming from the wound site.  You have muscle aches, chills, or you feel sick.  You have a fever. MAKE SURE YOU:   Understand these instructions.  Will watch your condition.  Will get help right away if you are not doing well or get worse.   This information is not intended to replace advice given to you by your health care provider. Make sure you discuss any questions you have with your health care provider.   Document Released: 04/14/2008 Document Revised: 04/27/2012 Document Reviewed: 01/10/2012 Elsevier Interactive Patient Education [employer].

## 2016-03-04 NOTE — ED Provider Notes (Signed)
INCISION AND DRAINAGE Performed by: Faustino Congress Consent: Verbal consent obtained. Risks and benefits: risks, benefits and alternatives were discussed Type: abscess  Body area: R axilla  Anesthesia: local infiltration  Incision was made with a scalpel.  Local anesthetic: lidocaine 2% with epinephrine  Anesthetic total: 3 ml  Complexity: complex Blunt dissection to break up loculations  Drainage: purulent  Drainage amount: minimal  Packing material: none  Patient tolerance: Patient tolerated the procedure well with no immediate complications.     Carlisle Cater, PA-C 03/04/16 [place], MD 03/04/16 [phone]

## 2016-03-04 NOTE — ED Notes (Signed)
PA at bedside.

## 2016-03-04 NOTE — ED Provider Notes (Signed)
CSN: [id number]     Arrival date & time 03/03/16  2351 History  By signing my name below, I, Joseph Manning, attest that this documentation has been prepared under the direction and in the presence of Sharlett Iles, MD. Electronically Signed: Helane Manning, ED Scribe. 03/04/2016. 4:05 AM.      Chief Complaint  Patient presents with  . Abdominal Pain   The history is provided by the patient. No language interpreter was used.   HPI Comments: Joseph Manning is a 65 y.o. male smoker at 1 ppd with a PMHx of tubular adenoma of colon, HTN, and DM who presents to the Emergency Department complaining of intermittent, aching abdominal pain onset 4 days ago. He reports associated constipation, noting his last BM occurred 4 days ago. He notes he takes stool softener and pain pills daily. He also reports noticing blood when wiping. Pt denies fevers and urinary sx.   He also c/o a boil to the right axilla. He reports the boil has shrunk in size since beginning to drain occasionally. He notes the drainage is yellow. Pt states he lives by himself and cannot afford any OTC medications, but has a pharmacy that will give him most prescriptions free of charge.   Past Medical History  Diagnosis Date  . Diabetes mellitus without complication ([place])   . Hypertension   . Depression   . COPD (chronic obstructive pulmonary disease) ([place])   . Tubular adenoma of colon 12/1999  . Anxiety   . Arthritis    Past Surgical History  Procedure Laterality Date  . Joint replacement Left   . Knee arthroscopy Left   . Cervical disc surgery    . Esophagogastroduodenoscopy N/A 12/28/2012    Procedure: ESOPHAGOGASTRODUODENOSCOPY (EGD);  Surgeon: Milus Banister, MD;  Location: [facility];  Service: Endoscopy;  Laterality: N/A;   Family History  Problem Relation Age of Onset  . Alzheimer's disease Mother   . Cancer Father     unknown type   Social History  Substance Use Topics  . Smoking status: Current Every  Day Smoker -- 1.00 packs/day for 50 years    Types: Cigarettes  . Smokeless tobacco: Never Used  . Alcohol Use: No    Review of Systems 10 Systems reviewed and all are negative for acute change except as noted in the HPI.   Allergies  Fentanyl and Versed  Home Medications   Prior to Admission medications   Medication Sig Start Date End Date Taking? Authorizing Provider  albuterol (PROVENTIL HFA;VENTOLIN HFA) 108 (90 BASE) MCG/ACT inhaler Inhale 2 puffs into the lungs every 6 (six) hours as needed for wheezing or shortness of breath. 05/01/15  Yes Brand Males, MD  aspirin EC 81 MG EC tablet Take 1 tablet (81 mg total) by mouth daily. 02/02/16  Yes Maryann Mikhail, DO  HYDROcodone-acetaminophen (NORCO/VICODIN) 5-325 MG per tablet Take 1-2 tablets by mouth 3 (three) times daily. He takes two tablets in the morning and evening and one tablet midday. 12/06/14  Yes Historical Provider, MD  NOVOLOG MIX 70/30 FLEXPEN (70-30) 100 UNIT/ML FlexPen Inject 35-50 Units as directed See admin instructions. Inject 45 units every morning and 50 units every evening. 01/25/16  Yes Historical Provider, MD  pantoprazole (PROTONIX) 40 MG tablet Take 1 tablet (40 mg total) by mouth 2 (two) times daily. 02/02/16  Yes Maryann Mikhail, DO  SPIRIVA HANDIHALER 18 MCG inhalation capsule Place 1 capsule into inhaler and inhale daily. 01/14/16  Yes Historical Provider, MD  SYMBICORT 160-4.5 MCG/ACT inhaler Inhale 1 puff into the lungs 2 (two) times daily. 01/14/16  Yes Historical Provider, MD  atorvastatin (LIPITOR) 20 MG tablet Take 1 tablet (20 mg total) by mouth daily. Patient not taking: Reported on 03/04/2016 02/02/16   [name], DO  guaiFENesin (MUCINEX) 600 MG 12 hr tablet Take 1 tablet (600 mg total) by mouth 2 (two) times daily. Patient not taking: Reported on 01/31/2016 01/03/15   [name], MD   BP 104/56 mmHg  Pulse 93  Temp(Src) 97.9 F (36.6 C) (Oral)  Resp 16  SpO2 92% Physical Exam   Constitutional: He is oriented to person, place, and time. He appears well-developed and well-nourished. No distress.  HENT:  Head: Normocephalic and atraumatic.  Poor dentition  Eyes: Conjunctivae are normal. Pupils are equal, round, and reactive to light.  Neck: Neck supple.  Cardiovascular: Normal rate, regular rhythm and normal heart sounds.   No murmur heard. Pulmonary/Chest: Effort normal. He has wheezes (expiratory, bilaterally).  Abdominal: Soft. Bowel sounds are normal. He exhibits no distension. There is no tenderness.  Musculoskeletal: He exhibits no edema.  Neurological: He is alert and oriented to person, place, and time.  Fluent speech  Skin: Skin is warm and dry.  2cm area of induration, fluctuance in R axilla, small amount of thin drainage from center  Psychiatric: He has a normal mood and affect. Judgment normal.  Nursing note and vitals reviewed.   ED Course  Procedures    COORDINATION OF CARE: 4:03 AM - Discussed plans to order diagnostic studies and medication for constipation. Will give pt magnesium citrate. Discussed plans to I&D pt's abscess. Pt advised of plan for treatment and pt agrees.  Labs Review Labs Reviewed  COMPREHENSIVE METABOLIC PANEL - Abnormal; Notable for the following:    CO2 21 (*)    Glucose, Bld 125 (*)    BUN 27 (*)    AST 12 (*)    ALT 10 (*)    All other components within normal limits  CBC - Abnormal; Notable for the following:    WBC 14.3 (*)    Platelets 422 (*)    All other components within normal limits  POC OCCULT BLOOD, ED - Abnormal; Notable for the following:    Fecal Occult Bld POSITIVE (*)    All other components within normal limits  TYPE AND SCREEN    Imaging Review No results found. I have personally reviewed and evaluated these lab results as part of my medical decision-making.   Medications  lidocaine-EPINEPHrine (XYLOCAINE W/EPI) 1 %-1:100000 (with pres) injection 10 mL (10 mLs Other Given 03/04/16  0415)  magnesium citrate solution 1 Bottle (1 Bottle Oral Given 03/04/16 0415)    MDM   Final diagnoses:  Constipation, unspecified constipation type  External hemorrhoids  Abscess of axilla, right   Patient with several days of constipation and associated with abdominal discomfort and blood on toilet paper when he wipes. He also notes a boil in his right axilla that has been draining. Patient was comfortable on exam, no abdominal tenderness. Nurse obtained Hemoccult test and noted significant external hemorrhoids, however patient without complaints of significant rectal pain. Obtained above lab work to evaluate his abdominal pain which was reassuring. Gave the patient magnesium citrate in the ED and provided with prescription for MiraLAX. Discussed constipation treatment as well as hemorrhoid treatment. Also gave Anusol for hemorrhoid relief. Regarding axillary abscess, it was incised and drained in the ED; see separate procedure note for details.  Patient tolerated procedure well. No surrounding erythema suggestive of cellulitis and patient without significant tenderness in this area, therefore I feel that I&D is sufficient. I have discussed supportive care and reviewed return precautions including any worsening pain or drainage, fevers, or worsening abdominal pain. Patient voiced understanding and was discharged in satisfactory condition.  I personally performed the services described in this documentation, which was scribed in my presence. The recorded information has been reviewed and is accurate.   Sharlett Iles, MD 03/04/16 [phone]

## 2016-03-04 NOTE — ED Notes (Signed)
Pt complains of being constipated for three days, then he states that he's bleeding from his rectum but only on the tissue

## 2016-03-20 DIAGNOSIS — E1129 Type 2 diabetes mellitus with other diabetic kidney complication: Secondary | ICD-10-CM | POA: Diagnosis not present

## 2016-03-20 DIAGNOSIS — G8929 Other chronic pain: Secondary | ICD-10-CM | POA: Diagnosis not present

## 2016-03-20 DIAGNOSIS — N183 Chronic kidney disease, stage 3 (moderate): Secondary | ICD-10-CM | POA: Diagnosis not present

## 2016-04-21 DIAGNOSIS — G8929 Other chronic pain: Secondary | ICD-10-CM | POA: Diagnosis not present

## 2016-04-21 DIAGNOSIS — J449 Chronic obstructive pulmonary disease, unspecified: Secondary | ICD-10-CM | POA: Diagnosis not present

## 2016-04-21 DIAGNOSIS — Z6833 Body mass index (BMI) 33.0-33.9, adult: Secondary | ICD-10-CM | POA: Diagnosis not present

## 2016-04-21 DIAGNOSIS — M542 Cervicalgia: Secondary | ICD-10-CM | POA: Diagnosis not present

## 2016-04-21 DIAGNOSIS — F17210 Nicotine dependence, cigarettes, uncomplicated: Secondary | ICD-10-CM | POA: Diagnosis not present

## 2016-04-21 DIAGNOSIS — E1129 Type 2 diabetes mellitus with other diabetic kidney complication: Secondary | ICD-10-CM | POA: Diagnosis not present

## 2016-08-21 DIAGNOSIS — G8929 Other chronic pain: Secondary | ICD-10-CM | POA: Diagnosis not present

## 2016-08-21 DIAGNOSIS — J438 Other emphysema: Secondary | ICD-10-CM | POA: Diagnosis not present

## 2016-08-21 DIAGNOSIS — Z6834 Body mass index (BMI) 34.0-34.9, adult: Secondary | ICD-10-CM | POA: Diagnosis not present

## 2016-08-21 DIAGNOSIS — R972 Elevated prostate specific antigen [PSA]: Secondary | ICD-10-CM | POA: Diagnosis not present

## 2016-08-21 DIAGNOSIS — F17210 Nicotine dependence, cigarettes, uncomplicated: Secondary | ICD-10-CM | POA: Diagnosis not present

## 2016-08-21 DIAGNOSIS — J449 Chronic obstructive pulmonary disease, unspecified: Secondary | ICD-10-CM | POA: Diagnosis not present

## 2016-08-21 DIAGNOSIS — E1129 Type 2 diabetes mellitus with other diabetic kidney complication: Secondary | ICD-10-CM | POA: Diagnosis not present

## 2016-08-21 DIAGNOSIS — M542 Cervicalgia: Secondary | ICD-10-CM | POA: Diagnosis not present

## 2016-10-27 DIAGNOSIS — G8929 Other chronic pain: Secondary | ICD-10-CM | POA: Diagnosis not present

## 2016-11-09 ENCOUNTER — Emergency Department (HOSPITAL_COMMUNITY)
Admission: EM | Admit: 2016-11-09 | Discharge: 2016-11-10 | Disposition: A | Discharge status: Home / Self Care | Payer: Medicare Other | Attending: Emergency Medicine | Admitting: Emergency Medicine

## 2016-11-09 DIAGNOSIS — Z7982 Long term (current) use of aspirin: Secondary | ICD-10-CM | POA: Diagnosis not present

## 2016-11-09 DIAGNOSIS — L03211 Cellulitis of face: Secondary | ICD-10-CM | POA: Insufficient documentation

## 2016-11-09 DIAGNOSIS — J449 Chronic obstructive pulmonary disease, unspecified: Secondary | ICD-10-CM | POA: Insufficient documentation

## 2016-11-09 DIAGNOSIS — I10 Essential (primary) hypertension: Secondary | ICD-10-CM | POA: Insufficient documentation

## 2016-11-09 DIAGNOSIS — R220 Localized swelling, mass and lump, head: Secondary | ICD-10-CM | POA: Diagnosis present

## 2016-11-09 DIAGNOSIS — Z9660 Presence of unspecified orthopedic joint implant: Secondary | ICD-10-CM | POA: Diagnosis not present

## 2016-11-09 DIAGNOSIS — E119 Type 2 diabetes mellitus without complications: Secondary | ICD-10-CM | POA: Diagnosis not present

## 2016-11-09 DIAGNOSIS — Z794 Long term (current) use of insulin: Secondary | ICD-10-CM | POA: Diagnosis not present

## 2016-11-09 DIAGNOSIS — Z79899 Other long term (current) drug therapy: Secondary | ICD-10-CM | POA: Diagnosis not present

## 2016-11-09 DIAGNOSIS — F17210 Nicotine dependence, cigarettes, uncomplicated: Secondary | ICD-10-CM | POA: Diagnosis not present

## 2016-11-09 LAB — CBG MONITORING, ED: Glucose-Capillary: 340 mg/dL — ABNORMAL HIGH (ref 65–99)

## 2016-11-09 MED ORDER — LIDOCAINE HCL 2 % IJ SOLN
5.0000 mL | Freq: Once | INTRAMUSCULAR | Status: AC
  Administered 2016-11-10: 100 mg via INTRADERMAL
  Filled 2016-11-09: qty 20

## 2016-11-09 MED ORDER — INSULIN ASPART 100 UNIT/ML ~~LOC~~ SOLN
5.0000 [IU] | Freq: Once | SUBCUTANEOUS | Status: AC
  Administered 2016-11-10: 5 [IU] via SUBCUTANEOUS
  Filled 2016-11-09: qty 1

## 2016-11-09 MED ORDER — CLINDAMYCIN PHOSPHATE 600 MG/50ML IV SOLN
600.0000 mg | Freq: Once | INTRAVENOUS | Status: AC
  Administered 2016-11-10: 600 mg via INTRAVENOUS
  Filled 2016-11-09: qty 50

## 2016-11-09 MED ORDER — SODIUM CHLORIDE 0.9 % IV BOLUS (SEPSIS)
1000.0000 mL | Freq: Once | INTRAVENOUS | Status: AC
  Administered 2016-11-10: 1000 mL via INTRAVENOUS

## 2016-11-09 NOTE — ED Triage Notes (Signed)
Pt states that 2-3 days ago he started having a bump above his L eye that he tried to pop. States that it has now started to make his L eye swell. Alert and oreinted.

## 2016-11-09 NOTE — ED Notes (Signed)
Pt reports having bump to center forehead for the last several days that has gotten worse and increased itching to left eye.

## 2016-11-09 NOTE — ED Notes (Signed)
Bed: WTR7 Expected date:  Expected time:  Means of arrival:  Comments: 

## 2016-11-10 DIAGNOSIS — J449 Chronic obstructive pulmonary disease, unspecified: Secondary | ICD-10-CM | POA: Diagnosis not present

## 2016-11-10 DIAGNOSIS — R220 Localized swelling, mass and lump, head: Secondary | ICD-10-CM | POA: Diagnosis present

## 2016-11-10 DIAGNOSIS — E119 Type 2 diabetes mellitus without complications: Secondary | ICD-10-CM | POA: Diagnosis not present

## 2016-11-10 DIAGNOSIS — Z7982 Long term (current) use of aspirin: Secondary | ICD-10-CM | POA: Diagnosis not present

## 2016-11-10 DIAGNOSIS — Z79899 Other long term (current) drug therapy: Secondary | ICD-10-CM | POA: Diagnosis not present

## 2016-11-10 DIAGNOSIS — L03211 Cellulitis of face: Secondary | ICD-10-CM | POA: Diagnosis not present

## 2016-11-10 DIAGNOSIS — Z9660 Presence of unspecified orthopedic joint implant: Secondary | ICD-10-CM | POA: Diagnosis not present

## 2016-11-10 DIAGNOSIS — F17210 Nicotine dependence, cigarettes, uncomplicated: Secondary | ICD-10-CM | POA: Diagnosis not present

## 2016-11-10 DIAGNOSIS — Z794 Long term (current) use of insulin: Secondary | ICD-10-CM | POA: Diagnosis not present

## 2016-11-10 DIAGNOSIS — I10 Essential (primary) hypertension: Secondary | ICD-10-CM | POA: Diagnosis not present

## 2016-11-10 LAB — CBC WITH DIFFERENTIAL/PLATELET
BASOS ABS: 0 10*3/uL (ref 0.0–0.1)
BASOS PCT: 0 %
EOS ABS: 0.2 10*3/uL (ref 0.0–0.7)
Eosinophils Relative: 2 %
HEMATOCRIT: 41.2 % (ref 39.0–52.0)
HEMOGLOBIN: 14.1 g/dL (ref 13.0–17.0)
Lymphocytes Relative: 18 %
Lymphs Abs: 2.2 10*3/uL (ref 0.7–4.0)
MCH: 31.4 pg (ref 26.0–34.0)
MCHC: 34.2 g/dL (ref 30.0–36.0)
MCV: 91.8 fL (ref 78.0–100.0)
Monocytes Absolute: 0.8 10*3/uL (ref 0.1–1.0)
Monocytes Relative: 7 %
NEUTROS ABS: 8.9 10*3/uL — AB (ref 1.7–7.7)
NEUTROS PCT: 73 %
Platelets: 316 10*3/uL (ref 150–400)
RBC: 4.49 MIL/uL (ref 4.22–5.81)
RDW: 13.2 % (ref 11.5–15.5)
WBC: 12.2 10*3/uL — ABNORMAL HIGH (ref 4.0–10.5)

## 2016-11-10 LAB — BASIC METABOLIC PANEL
ANION GAP: 9 (ref 5–15)
BUN: 21 mg/dL — ABNORMAL HIGH (ref 6–20)
CALCIUM: 8.7 mg/dL — AB (ref 8.9–10.3)
CO2: 23 mmol/L (ref 22–32)
Chloride: 105 mmol/L (ref 101–111)
Creatinine, Ser: 1.09 mg/dL (ref 0.61–1.24)
GFR calc non Af Amer: >60 mL/min (ref >60)
Glucose, Bld: 294 mg/dL — ABNORMAL HIGH (ref 65–99)
Potassium: 4 mmol/L (ref 3.5–5.1)
SODIUM: 137 mmol/L (ref 135–145)

## 2016-11-10 MED ORDER — ACETAMINOPHEN 325 MG PO TABS
650.0000 mg | ORAL_TABLET | Freq: Once | ORAL | Status: AC
  Administered 2016-11-10: 650 mg via ORAL
  Filled 2016-11-10: qty 2

## 2016-11-10 MED ORDER — IBUPROFEN 800 MG PO TABS
800.0000 mg | ORAL_TABLET | Freq: Once | ORAL | Status: AC
  Administered 2016-11-10: 800 mg via ORAL
  Filled 2016-11-10: qty 1

## 2016-11-10 MED ORDER — CLINDAMYCIN HCL 150 MG PO CAPS: 450.0000 mg | ORAL_CAPSULE | Freq: Three times a day (TID) | ORAL | 0 refills | Status: DC

## 2016-11-10 MED ORDER — CLINDAMYCIN PHOSPHATE 900 MG/50ML IV SOLN: 900.0000 mg | Freq: Once | INTRAVENOUS | Status: DC

## 2016-11-10 NOTE — Discharge Instructions (Signed)
Use warm compresses around the area.  Keep the area clean and dry.  Follow-up with your primary care doctor

## 2016-11-10 NOTE — ED Provider Notes (Signed)
[facility] DEPT Provider Note   CSN: [id number] Arrival date & time: 11/09/16  2149     History   Chief Complaint Chief Complaint  Patient presents with  . Facial Swelling    HPI Joseph Manning is a 66 y.o. male.  HPI Patient presents to the emergency department with swelling in the forehead region between the eyebrows.  Patient states that he squeezed it and started noticing that he had redness that extended down from the area above his left eye.  Patient has no swelling of the eyelids or pain to range of motion of his eye. patient states that he did not take any medications prior to arrival.  States nothing seems make the condition better, but palpation makes the pain worseThe patient denies chest pain, shortness of breath, headache,blurred vision, neck pain, fever, cough, weakness, numbness, dizziness, anorexia, edema, abdominal pain, nausea, vomiting, diarrhea, rash, back pain, dysuria, hematemesis, bloody stool, near syncope, or syncope. Past Medical History:  Diagnosis Date  . Anxiety   . Arthritis   . COPD (chronic obstructive pulmonary disease) ([place])   . Depression   . Diabetes mellitus without complication ([place])   . Hypertension   . Tubular adenoma of colon 12/1999    Patient Active Problem List   Diagnosis Date Noted  . Elevated troponin 01/31/2016  . Pleuritic chest pain 01/31/2016  . Type 2 diabetes mellitus with hyperglycemia ([place]) 01/31/2016  . COPD, moderate ([place]) 05/01/2015  . Smoking 05/01/2015  . Community acquired pneumonia 01/01/2015    Class: Acute  . Pneumonia 01/01/2015  . Pleural effusion, right 01/01/2015  . Sepsis ([place]) 01/01/2015  . Acute kidney insufficiency 01/01/2015  . AKI (acute kidney injury) ([place])   . CAP (community acquired pneumonia)   . Hypotensive episode   . Lactic acidosis   . Loculated pleural effusion   . COPD exacerbation ([place]) 08/27/2014  . Right upper quadrant pain 02/09/2013  . Chronic back pain 12/30/2012   Class: Chronic  . Duodenitis 12/25/2012  . Leukocytosis 12/25/2012  . Smoker 12/25/2012  . Diabetes mellitus, type II ([place]) 12/25/2012  . Hypertension 12/25/2012  . Anxiety 12/25/2012    Past Surgical History:  Procedure Laterality Date  . CERVICAL DISC SURGERY    . ESOPHAGOGASTRODUODENOSCOPY N/A 12/28/2012   Procedure: ESOPHAGOGASTRODUODENOSCOPY (EGD);  Surgeon: Milus Banister, MD;  Location: [facility];  Service: Endoscopy;  Laterality: N/A;  . JOINT REPLACEMENT Left   . KNEE ARTHROSCOPY Left        Home Medications    Prior to Admission medications   Medication Sig Start Date End Date Taking? Authorizing Provider  albuterol (PROVENTIL HFA;VENTOLIN HFA) 108 (90 BASE) MCG/ACT inhaler Inhale 2 puffs into the lungs every 6 (six) hours as needed for wheezing or shortness of breath. 05/01/15   Brand Males, MD  aspirin EC 81 MG EC tablet Take 1 tablet (81 mg total) by mouth daily. 02/02/16   Maryann Mikhail, DO  atorvastatin (LIPITOR) 20 MG tablet Take 1 tablet (20 mg total) by mouth daily. Patient not taking: Reported on 03/04/2016 02/02/16   Cristal Ford, DO  HYDROcodone-acetaminophen (NORCO/VICODIN) 5-325 MG per tablet Take 1-2 tablets by mouth 3 (three) times daily. He takes two tablets in the morning and evening and one tablet midday. 12/06/14   Historical Provider, MD  hydrocortisone (ANUSOL-HC) 2.5 % rectal cream Apply rectally 2 times daily until hemorrhoid pain improves 03/04/16   Wenda Overland Little, MD  NOVOLOG MIX 70/30 FLEXPEN (70-30) 100 UNIT/ML FlexPen Inject 35-50  Units as directed See admin instructions. Inject 45 units every morning and 50 units every evening. 01/25/16   Historical Provider, MD  pantoprazole (PROTONIX) 40 MG tablet Take 1 tablet (40 mg total) by mouth 2 (two) times daily. 02/02/16   Maryann Mikhail, DO  polyethylene glycol powder (GLYCOLAX/MIRALAX) powder Drink 1 capful mixed in beverage one to three times daily as needed for constipation 03/04/16    Sharlett Iles, MD  SPIRIVA HANDIHALER 18 MCG inhalation capsule Place 1 capsule into inhaler and inhale daily. 01/14/16   Historical Provider, MD  SYMBICORT 160-4.5 MCG/ACT inhaler Inhale 1 puff into the lungs 2 (two) times daily. 01/14/16   Historical Provider, MD    Family History Family History  Problem Relation Age of Onset  . Alzheimer's disease Mother   . Cancer Father     unknown type    Social History Social History  Substance Use Topics  . Smoking status: Current Every Day Smoker    Packs/day: 1.00    Years: 50.00    Types: Cigarettes  . Smokeless tobacco: Never Used  . Alcohol use No     Allergies   Fentanyl and Versed [midazolam]   Review of Systems Review of Systems All other systems negative except as documented in the HPI. All pertinent positives and negatives as reviewed in the HPI.  Physical Exam Updated Vital Signs BP 153/82 (BP Location: Right Arm)   Pulse 99   Temp 97.6 F (36.4 C) (Oral)   Resp 18   SpO2 97%   Physical Exam  Constitutional: He is oriented to person, place, and time. He appears well-developed and well-nourished. No distress.  HENT:  Head: Normocephalic and atraumatic.    Mouth/Throat: Oropharynx is clear and moist.  Eyes: Pupils are equal, round, and reactive to light.  Neck: Normal range of motion. Neck supple.  Cardiovascular: Normal rate, regular rhythm and normal heart sounds.  Exam reveals no gallop and no friction rub.   No murmur heard. Pulmonary/Chest: Effort normal and breath sounds normal. No respiratory distress. He has no wheezes.  Neurological: He is alert and oriented to person, place, and time. He exhibits normal muscle tone. Coordination normal.  Skin: Skin is warm and dry. No rash noted. No erythema.  Psychiatric: He has a normal mood and affect. His behavior is normal.  Nursing note and vitals reviewed.    ED Treatments / Results  Labs (all labs ordered are listed, but only abnormal results are  displayed) Labs Reviewed  CBG MONITORING, ED - Abnormal; Notable for the following:       Result Value   Glucose-Capillary 340 (*)    All other components within normal limits  CBC WITH DIFFERENTIAL/PLATELET  BASIC METABOLIC PANEL    EKG  EKG Interpretation None       Radiology No results found.  Procedures Procedures (including critical care time)  Medications Ordered in ED Medications  sodium chloride 0.9 % bolus 1,000 mL (not administered)  clindamycin (CLEOCIN) IVPB 600 mg (not administered)  insulin aspart (novoLOG) injection 5 Units (5 Units Subcutaneous Given 11/10/16 0049)  lidocaine (XYLOCAINE) 2 % (with pres) injection 100 mg (100 mg Intradermal Given 11/10/16 0048)     Initial Impression / Assessment and Plan / ED Course  I have reviewed the triage vital signs and the nursing notes.  Pertinent labs & imaging results that were available during my care of the patient were reviewed by me and considered in my medical decision making (see chart for  details).  Clinical Course     INCISION AND DRAINAGE Performed by: Brent General Consent: Verbal consent obtained. Risks and benefits: risks, benefits and alternatives were discussed Type: abscess  Body area:forehead region just medial to the eyebrow on the left  Anesthesia: local infiltration  Needle aspiration performed  Local anesthetic: lidocaine 2% without epinephrine  Anesthetic total: 5 ml  Complexity: complex Blunt dissection to break up loculations  Drainage: purulent  Drainage amount: small   Patient tolerance: Patient tolerated the procedure well with no immediate complications.   There is no signs of periorbital or orbital cellulitis at this time patient will be given IV antibiotics.  Told to follow up in 2 days with his doctor or return here for recheck.  Patient agrees to the plan.  All questions were answered  Final Clinical Impressions(s) / ED Diagnoses   Final diagnoses:    None    New Prescriptions New Prescriptions   No medications on file     Dalia Heading, PA-C 11/10/16 0117    Drenda Freeze, MD 11/10/16 [phone]

## 2016-11-21 DIAGNOSIS — J449 Chronic obstructive pulmonary disease, unspecified: Secondary | ICD-10-CM | POA: Diagnosis not present

## 2016-11-21 DIAGNOSIS — R972 Elevated prostate specific antigen [PSA]: Secondary | ICD-10-CM | POA: Diagnosis not present

## 2016-11-21 DIAGNOSIS — E1129 Type 2 diabetes mellitus with other diabetic kidney complication: Secondary | ICD-10-CM | POA: Diagnosis not present

## 2016-11-21 DIAGNOSIS — Z6834 Body mass index (BMI) 34.0-34.9, adult: Secondary | ICD-10-CM | POA: Diagnosis not present

## 2016-11-21 DIAGNOSIS — M542 Cervicalgia: Secondary | ICD-10-CM | POA: Diagnosis not present

## 2016-11-21 DIAGNOSIS — L0201 Cutaneous abscess of face: Secondary | ICD-10-CM | POA: Diagnosis not present

## 2016-11-21 DIAGNOSIS — Z1389 Encounter for screening for other disorder: Secondary | ICD-10-CM | POA: Diagnosis not present

## 2016-11-21 DIAGNOSIS — G8929 Other chronic pain: Secondary | ICD-10-CM | POA: Diagnosis not present

## 2017-01-28 ENCOUNTER — Ambulatory Visit: Payer: Medicare Other | Admitting: Internal Medicine

## 2017-02-10 DIAGNOSIS — F17210 Nicotine dependence, cigarettes, uncomplicated: Secondary | ICD-10-CM | POA: Diagnosis not present

## 2017-02-10 DIAGNOSIS — E1129 Type 2 diabetes mellitus with other diabetic kidney complication: Secondary | ICD-10-CM | POA: Diagnosis not present

## 2017-02-10 DIAGNOSIS — Z6835 Body mass index (BMI) 35.0-35.9, adult: Secondary | ICD-10-CM | POA: Diagnosis not present

## 2017-02-10 DIAGNOSIS — J449 Chronic obstructive pulmonary disease, unspecified: Secondary | ICD-10-CM | POA: Diagnosis not present

## 2017-02-10 DIAGNOSIS — M542 Cervicalgia: Secondary | ICD-10-CM | POA: Diagnosis not present

## 2017-02-10 DIAGNOSIS — G8929 Other chronic pain: Secondary | ICD-10-CM | POA: Diagnosis not present

## 2017-02-10 DIAGNOSIS — R972 Elevated prostate specific antigen [PSA]: Secondary | ICD-10-CM | POA: Diagnosis not present

## 2017-05-15 DIAGNOSIS — R972 Elevated prostate specific antigen [PSA]: Secondary | ICD-10-CM | POA: Diagnosis not present

## 2017-05-15 DIAGNOSIS — J189 Pneumonia, unspecified organism: Secondary | ICD-10-CM | POA: Diagnosis not present

## 2017-05-15 DIAGNOSIS — Z6835 Body mass index (BMI) 35.0-35.9, adult: Secondary | ICD-10-CM | POA: Diagnosis not present

## 2017-05-15 DIAGNOSIS — R42 Dizziness and giddiness: Secondary | ICD-10-CM | POA: Diagnosis not present

## 2017-05-15 DIAGNOSIS — J449 Chronic obstructive pulmonary disease, unspecified: Secondary | ICD-10-CM | POA: Diagnosis not present

## 2017-05-15 DIAGNOSIS — L0201 Cutaneous abscess of face: Secondary | ICD-10-CM | POA: Diagnosis not present

## 2017-05-15 DIAGNOSIS — E1129 Type 2 diabetes mellitus with other diabetic kidney complication: Secondary | ICD-10-CM | POA: Diagnosis not present

## 2017-05-27 DIAGNOSIS — I951 Orthostatic hypotension: Secondary | ICD-10-CM | POA: Diagnosis not present

## 2017-05-27 DIAGNOSIS — Z79899 Other long term (current) drug therapy: Secondary | ICD-10-CM

## 2017-05-27 DIAGNOSIS — J449 Chronic obstructive pulmonary disease, unspecified: Secondary | ICD-10-CM | POA: Diagnosis present

## 2017-05-27 DIAGNOSIS — H9190 Unspecified hearing loss, unspecified ear: Secondary | ICD-10-CM | POA: Diagnosis present

## 2017-05-27 DIAGNOSIS — I472 Ventricular tachycardia: Secondary | ICD-10-CM | POA: Diagnosis not present

## 2017-05-27 DIAGNOSIS — I2119 ST elevation (STEMI) myocardial infarction involving other coronary artery of inferior wall: Principal | ICD-10-CM | POA: Diagnosis present

## 2017-05-27 DIAGNOSIS — M549 Dorsalgia, unspecified: Secondary | ICD-10-CM | POA: Diagnosis present

## 2017-05-27 DIAGNOSIS — R0600 Dyspnea, unspecified: Secondary | ICD-10-CM | POA: Diagnosis not present

## 2017-05-27 DIAGNOSIS — I2111 ST elevation (STEMI) myocardial infarction involving right coronary artery: Secondary | ICD-10-CM | POA: Diagnosis not present

## 2017-05-27 DIAGNOSIS — Z6836 Body mass index (BMI) 36.0-36.9, adult: Secondary | ICD-10-CM

## 2017-05-27 DIAGNOSIS — R008 Other abnormalities of heart beat: Secondary | ICD-10-CM | POA: Diagnosis present

## 2017-05-27 DIAGNOSIS — I2129 ST elevation (STEMI) myocardial infarction involving other sites: Secondary | ICD-10-CM | POA: Diagnosis not present

## 2017-05-27 DIAGNOSIS — N179 Acute kidney failure, unspecified: Secondary | ICD-10-CM | POA: Diagnosis not present

## 2017-05-27 DIAGNOSIS — I110 Hypertensive heart disease with heart failure: Secondary | ICD-10-CM | POA: Diagnosis not present

## 2017-05-27 DIAGNOSIS — I493 Ventricular premature depolarization: Secondary | ICD-10-CM | POA: Diagnosis present

## 2017-05-27 DIAGNOSIS — I255 Ischemic cardiomyopathy: Secondary | ICD-10-CM | POA: Diagnosis present

## 2017-05-27 DIAGNOSIS — Z96698 Presence of other orthopedic joint implants: Secondary | ICD-10-CM | POA: Diagnosis present

## 2017-05-27 DIAGNOSIS — F17210 Nicotine dependence, cigarettes, uncomplicated: Secondary | ICD-10-CM | POA: Diagnosis present

## 2017-05-27 DIAGNOSIS — I2510 Atherosclerotic heart disease of native coronary artery without angina pectoris: Secondary | ICD-10-CM | POA: Diagnosis present

## 2017-05-27 DIAGNOSIS — G8929 Other chronic pain: Secondary | ICD-10-CM | POA: Diagnosis present

## 2017-05-27 DIAGNOSIS — M1990 Unspecified osteoarthritis, unspecified site: Secondary | ICD-10-CM | POA: Diagnosis present

## 2017-05-27 DIAGNOSIS — Z888 Allergy status to other drugs, medicaments and biological substances status: Secondary | ICD-10-CM

## 2017-05-27 DIAGNOSIS — I5021 Acute systolic (congestive) heart failure: Secondary | ICD-10-CM | POA: Diagnosis present

## 2017-05-27 DIAGNOSIS — Z590 Homelessness: Secondary | ICD-10-CM

## 2017-05-27 DIAGNOSIS — E669 Obesity, unspecified: Secondary | ICD-10-CM | POA: Diagnosis present

## 2017-05-27 DIAGNOSIS — Z79891 Long term (current) use of opiate analgesic: Secondary | ICD-10-CM

## 2017-05-27 DIAGNOSIS — R7989 Other specified abnormal findings of blood chemistry: Secondary | ICD-10-CM | POA: Diagnosis present

## 2017-05-27 DIAGNOSIS — Z885 Allergy status to narcotic agent status: Secondary | ICD-10-CM

## 2017-05-27 DIAGNOSIS — M542 Cervicalgia: Secondary | ICD-10-CM | POA: Diagnosis present

## 2017-05-27 DIAGNOSIS — Z7951 Long term (current) use of inhaled steroids: Secondary | ICD-10-CM

## 2017-05-27 DIAGNOSIS — R079 Chest pain, unspecified: Secondary | ICD-10-CM | POA: Diagnosis not present

## 2017-05-27 DIAGNOSIS — I2109 ST elevation (STEMI) myocardial infarction involving other coronary artery of anterior wall: Secondary | ICD-10-CM | POA: Diagnosis present

## 2017-05-27 DIAGNOSIS — E119 Type 2 diabetes mellitus without complications: Secondary | ICD-10-CM | POA: Diagnosis present

## 2017-05-27 DIAGNOSIS — Z7982 Long term (current) use of aspirin: Secondary | ICD-10-CM

## 2017-05-28 ENCOUNTER — Encounter (HOSPITAL_COMMUNITY): Payer: Self-pay | Admitting: Emergency Medicine

## 2017-05-28 ENCOUNTER — Inpatient Hospital Stay (HOSPITAL_COMMUNITY)
Admission: EM | Disposition: A | Discharge status: Home / Self Care | Payer: Self-pay | Source: Home / Self Care | Attending: Cardiovascular Disease

## 2017-05-28 ENCOUNTER — Inpatient Hospital Stay (HOSPITAL_COMMUNITY): Payer: Medicare Other

## 2017-05-28 ENCOUNTER — Emergency Department (HOSPITAL_COMMUNITY): Payer: Medicare Other

## 2017-05-28 ENCOUNTER — Inpatient Hospital Stay (HOSPITAL_COMMUNITY)
Admission: EM | Admit: 2017-05-28 | Discharge: 2017-06-02 | DRG: 246 | Disposition: A | Discharge status: Home / Self Care | Payer: Medicare Other | Attending: Cardiovascular Disease | Admitting: Cardiovascular Disease

## 2017-05-28 DIAGNOSIS — I2510 Atherosclerotic heart disease of native coronary artery without angina pectoris: Secondary | ICD-10-CM | POA: Diagnosis not present

## 2017-05-28 DIAGNOSIS — E669 Obesity, unspecified: Secondary | ICD-10-CM | POA: Diagnosis present

## 2017-05-28 DIAGNOSIS — I519 Heart disease, unspecified: Secondary | ICD-10-CM | POA: Diagnosis not present

## 2017-05-28 DIAGNOSIS — I340 Nonrheumatic mitral (valve) insufficiency: Secondary | ICD-10-CM | POA: Diagnosis not present

## 2017-05-28 DIAGNOSIS — I2119 ST elevation (STEMI) myocardial infarction involving other coronary artery of inferior wall: Secondary | ICD-10-CM

## 2017-05-28 DIAGNOSIS — I493 Ventricular premature depolarization: Secondary | ICD-10-CM | POA: Diagnosis present

## 2017-05-28 DIAGNOSIS — M542 Cervicalgia: Secondary | ICD-10-CM | POA: Diagnosis present

## 2017-05-28 DIAGNOSIS — R079 Chest pain, unspecified: Secondary | ICD-10-CM | POA: Diagnosis not present

## 2017-05-28 DIAGNOSIS — I5043 Acute on chronic combined systolic (congestive) and diastolic (congestive) heart failure: Secondary | ICD-10-CM | POA: Diagnosis not present

## 2017-05-28 DIAGNOSIS — I2109 ST elevation (STEMI) myocardial infarction involving other coronary artery of anterior wall: Secondary | ICD-10-CM | POA: Diagnosis not present

## 2017-05-28 DIAGNOSIS — R7989 Other specified abnormal findings of blood chemistry: Secondary | ICD-10-CM | POA: Diagnosis present

## 2017-05-28 DIAGNOSIS — I472 Ventricular tachycardia: Secondary | ICD-10-CM | POA: Diagnosis not present

## 2017-05-28 DIAGNOSIS — E119 Type 2 diabetes mellitus without complications: Secondary | ICD-10-CM | POA: Diagnosis not present

## 2017-05-28 DIAGNOSIS — I2111 ST elevation (STEMI) myocardial infarction involving right coronary artery: Secondary | ICD-10-CM | POA: Diagnosis not present

## 2017-05-28 DIAGNOSIS — I5021 Acute systolic (congestive) heart failure: Secondary | ICD-10-CM | POA: Diagnosis not present

## 2017-05-28 DIAGNOSIS — F17210 Nicotine dependence, cigarettes, uncomplicated: Secondary | ICD-10-CM | POA: Diagnosis not present

## 2017-05-28 DIAGNOSIS — I110 Hypertensive heart disease with heart failure: Secondary | ICD-10-CM | POA: Diagnosis not present

## 2017-05-28 DIAGNOSIS — N179 Acute kidney failure, unspecified: Secondary | ICD-10-CM | POA: Diagnosis not present

## 2017-05-28 DIAGNOSIS — H9190 Unspecified hearing loss, unspecified ear: Secondary | ICD-10-CM | POA: Diagnosis not present

## 2017-05-28 DIAGNOSIS — G8929 Other chronic pain: Secondary | ICD-10-CM | POA: Diagnosis not present

## 2017-05-28 DIAGNOSIS — Z96698 Presence of other orthopedic joint implants: Secondary | ICD-10-CM | POA: Diagnosis present

## 2017-05-28 DIAGNOSIS — R008 Other abnormalities of heart beat: Secondary | ICD-10-CM | POA: Diagnosis present

## 2017-05-28 DIAGNOSIS — I213 ST elevation (STEMI) myocardial infarction of unspecified site: Secondary | ICD-10-CM | POA: Diagnosis present

## 2017-05-28 DIAGNOSIS — Z6836 Body mass index (BMI) 36.0-36.9, adult: Secondary | ICD-10-CM | POA: Diagnosis not present

## 2017-05-28 DIAGNOSIS — J449 Chronic obstructive pulmonary disease, unspecified: Secondary | ICD-10-CM | POA: Diagnosis not present

## 2017-05-28 DIAGNOSIS — I951 Orthostatic hypotension: Secondary | ICD-10-CM | POA: Diagnosis not present

## 2017-05-28 DIAGNOSIS — M1990 Unspecified osteoarthritis, unspecified site: Secondary | ICD-10-CM | POA: Diagnosis present

## 2017-05-28 DIAGNOSIS — R0600 Dyspnea, unspecified: Secondary | ICD-10-CM | POA: Diagnosis not present

## 2017-05-28 DIAGNOSIS — I255 Ischemic cardiomyopathy: Secondary | ICD-10-CM | POA: Diagnosis not present

## 2017-05-28 DIAGNOSIS — M549 Dorsalgia, unspecified: Secondary | ICD-10-CM | POA: Diagnosis present

## 2017-05-28 DIAGNOSIS — Z888 Allergy status to other drugs, medicaments and biological substances status: Secondary | ICD-10-CM | POA: Diagnosis not present

## 2017-05-28 HISTORY — PX: CORONARY STENT INTERVENTION: CATH118234

## 2017-05-28 HISTORY — PX: LEFT HEART CATH AND CORONARY ANGIOGRAPHY: CATH118249

## 2017-05-28 LAB — ECHOCARDIOGRAM COMPLETE
EERAT: 8.36
FS: 21 % — AB (ref 28–44)
Height: 68 in
IV/PV OW: 1.22
LA diam end sys: 39 mm
LA diam index: 1.76 cm/m2
LA vol A4C: 61.5 ml
LA vol: 60.4 mL
LASIZE: 39 mm
LAVOLIN: 27.3 mL/m2
LDCA: 4.15 cm2
LV E/e' medial: 8.36
LV TDI E'LATERAL: 6.09
LV TDI E'MEDIAL: 4.35
LV e' LATERAL: 6.09 cm/s
LVEEAVG: 8.36
LVOTD: 23 mm
Lateral S' vel: 7.62 cm/s
MV pk E vel: 50.9 m/s
MVPKAVEL: 82.9 m/s
PW: 9 mm — AB (ref 0.6–1.1)
Weight: 3844.82 oz

## 2017-05-28 LAB — TROPONIN I
Troponin I: >65 ng/mL (ref <0.03)
Troponin I: >65 ng/mL (ref <0.03)

## 2017-05-28 LAB — COMPREHENSIVE METABOLIC PANEL
ALBUMIN: 4.2 g/dL (ref 3.5–5.0)
ALT: 72 U/L — ABNORMAL HIGH (ref 17–63)
AST: 217 U/L — AB (ref 15–41)
Alkaline Phosphatase: 91 U/L (ref 38–126)
Anion gap: 13 (ref 5–15)
BUN: 33 mg/dL — ABNORMAL HIGH (ref 6–20)
CHLORIDE: 102 mmol/L (ref 101–111)
CO2: 22 mmol/L (ref 22–32)
Calcium: 9.3 mg/dL (ref 8.9–10.3)
Creatinine, Ser: 1.6 mg/dL — ABNORMAL HIGH (ref 0.61–1.24)
GFR calc Af Amer: 50 mL/min — ABNORMAL LOW (ref >60)
GFR calc non Af Amer: 43 mL/min — ABNORMAL LOW (ref >60)
GLUCOSE: 328 mg/dL — AB (ref 65–99)
POTASSIUM: 4.2 mmol/L (ref 3.5–5.1)
Sodium: 137 mmol/L (ref 135–145)
Total Bilirubin: 0.8 mg/dL (ref 0.3–1.2)
Total Protein: 8.5 g/dL — ABNORMAL HIGH (ref 6.5–8.1)

## 2017-05-28 LAB — CBC
HEMATOCRIT: 38.6 % — AB (ref 39.0–52.0)
HEMATOCRIT: 43.5 % (ref 39.0–52.0)
HEMOGLOBIN: 12.9 g/dL — AB (ref 13.0–17.0)
HEMOGLOBIN: 15 g/dL (ref 13.0–17.0)
MCH: 31.7 pg (ref 26.0–34.0)
MCH: 31.8 pg (ref 26.0–34.0)
MCHC: 33.4 g/dL (ref 30.0–36.0)
MCHC: 34.5 g/dL (ref 30.0–36.0)
MCV: 92.2 fL (ref 78.0–100.0)
MCV: 94.8 fL (ref 78.0–100.0)
Platelets: 293 10*3/uL (ref 150–400)
Platelets: 349 10*3/uL (ref 150–400)
RBC: 4.07 MIL/uL — ABNORMAL LOW (ref 4.22–5.81)
RBC: 4.72 MIL/uL (ref 4.22–5.81)
RDW: 12.9 % (ref 11.5–15.5)
RDW: 13.1 % (ref 11.5–15.5)
WBC: 21.9 10*3/uL — ABNORMAL HIGH (ref 4.0–10.5)
WBC: 25.5 10*3/uL — ABNORMAL HIGH (ref 4.0–10.5)

## 2017-05-28 LAB — BASIC METABOLIC PANEL
ANION GAP: 11 (ref 5–15)
ANION GAP: 13 (ref 5–15)
BUN: 30 mg/dL — ABNORMAL HIGH (ref 6–20)
BUN: 33 mg/dL — AB (ref 6–20)
CALCIUM: 8.5 mg/dL — AB (ref 8.9–10.3)
CALCIUM: 9.2 mg/dL (ref 8.9–10.3)
CHLORIDE: 104 mmol/L (ref 101–111)
CO2: 18 mmol/L — AB (ref 22–32)
CO2: 22 mmol/L (ref 22–32)
Chloride: 101 mmol/L (ref 101–111)
Creatinine, Ser: 1.36 mg/dL — ABNORMAL HIGH (ref 0.61–1.24)
Creatinine, Ser: 1.58 mg/dL — ABNORMAL HIGH (ref 0.61–1.24)
GFR calc Af Amer: 51 mL/min — ABNORMAL LOW (ref >60)
GFR calc non Af Amer: 44 mL/min — ABNORMAL LOW (ref >60)
GFR calc non Af Amer: 52 mL/min — ABNORMAL LOW (ref >60)
GLUCOSE: 330 mg/dL — AB (ref 65–99)
GLUCOSE: 378 mg/dL — AB (ref 65–99)
POTASSIUM: 4.6 mmol/L (ref 3.5–5.1)
Potassium: 4.2 mmol/L (ref 3.5–5.1)
Sodium: 133 mmol/L — ABNORMAL LOW (ref 135–145)
Sodium: 136 mmol/L (ref 135–145)

## 2017-05-28 LAB — MRSA PCR SCREENING: MRSA by PCR: NEGATIVE

## 2017-05-28 LAB — LIPID PANEL
CHOL/HDL RATIO: 4.8 ratio
Cholesterol: 245 mg/dL — ABNORMAL HIGH (ref 0–200)
HDL: 51 mg/dL (ref >40)
LDL CALC: 165 mg/dL — AB (ref 0–99)
Triglycerides: 146 mg/dL (ref <150)
VLDL: 29 mg/dL (ref 0–40)

## 2017-05-28 LAB — POCT ACTIVATED CLOTTING TIME
Activated Clotting Time: 357 seconds
Activated Clotting Time: 175 seconds

## 2017-05-28 LAB — POCT I-STAT, CHEM 8
BUN: 32 mg/dL — ABNORMAL HIGH (ref 6–20)
CHLORIDE: 105 mmol/L (ref 101–111)
CREATININE: 1.2 mg/dL (ref 0.61–1.24)
Calcium, Ion: 1.22 mmol/L (ref 1.15–1.40)
GLUCOSE: 303 mg/dL — AB (ref 65–99)
HEMATOCRIT: 41 % (ref 39.0–52.0)
HEMOGLOBIN: 13.9 g/dL (ref 13.0–17.0)
POTASSIUM: 4.2 mmol/L (ref 3.5–5.1)
Sodium: 137 mmol/L (ref 135–145)
TCO2: 21 mmol/L (ref 0–100)

## 2017-05-28 LAB — I-STAT TROPONIN, ED

## 2017-05-28 LAB — PROTIME-INR
INR: 0.95
Prothrombin Time: 12.7 seconds (ref 11.4–15.2)

## 2017-05-28 LAB — GLUCOSE, CAPILLARY
GLUCOSE-CAPILLARY: 198 mg/dL — AB (ref 65–99)
Glucose-Capillary: 386 mg/dL — ABNORMAL HIGH (ref 65–99)
Glucose-Capillary: 163 mg/dL — ABNORMAL HIGH (ref 65–99)

## 2017-05-28 LAB — APTT: APTT: 27 s (ref 24–36)

## 2017-05-28 SURGERY — LEFT HEART CATH AND CORONARY ANGIOGRAPHY
Anesthesia: LOCAL

## 2017-05-28 MED ORDER — ASPIRIN 81 MG PO CHEW
324.0000 mg | CHEWABLE_TABLET | Freq: Once | ORAL | Status: AC
  Administered 2017-05-28: 324 mg via ORAL

## 2017-05-28 MED ORDER — LABETALOL HCL 5 MG/ML IV SOLN: 10.0000 mg | INTRAVENOUS | Status: AC | PRN

## 2017-05-28 MED ORDER — AMIODARONE HCL IN DEXTROSE 360-4.14 MG/200ML-% IV SOLN
30.0000 mg/h | INTRAVENOUS | Status: DC
  Administered 2017-05-28: 30 mg/h via INTRAVENOUS
  Filled 2017-05-28: qty 200

## 2017-05-28 MED ORDER — ONDANSETRON HCL 4 MG/2ML IJ SOLN: 4.0000 mg | Freq: Four times a day (QID) | INTRAMUSCULAR | Status: DC | PRN

## 2017-05-28 MED ORDER — PANTOPRAZOLE SODIUM 40 MG PO TBEC
40.0000 mg | DELAYED_RELEASE_TABLET | Freq: Two times a day (BID) | ORAL | Status: DC
  Administered 2017-05-28 – 2017-06-02 (×11): 40 mg via ORAL
  Filled 2017-05-28 (×14): qty 1

## 2017-05-28 MED ORDER — ASPIRIN 81 MG PO CHEW: 81.0000 mg | CHEWABLE_TABLET | Freq: Every day | ORAL | Status: DC

## 2017-05-28 MED ORDER — ACETAMINOPHEN 325 MG PO TABS
650.0000 mg | ORAL_TABLET | ORAL | Status: DC | PRN
  Administered 2017-05-29 – 2017-06-02 (×4): 650 mg via ORAL
  Filled 2017-05-28 (×4): qty 2

## 2017-05-28 MED ORDER — METOPROLOL TARTRATE 12.5 MG HALF TABLET
12.5000 mg | ORAL_TABLET | Freq: Two times a day (BID) | ORAL | Status: DC
  Administered 2017-05-28 (×2): 12.5 mg via ORAL
  Filled 2017-05-28 (×2): qty 1

## 2017-05-28 MED ORDER — ASPIRIN 81 MG PO CHEW
CHEWABLE_TABLET | ORAL | Status: AC
  Filled 2017-05-28: qty 4

## 2017-05-28 MED ORDER — ALBUTEROL SULFATE (2.5 MG/3ML) 0.083% IN NEBU
3.0000 mL | INHALATION_SOLUTION | Freq: Four times a day (QID) | RESPIRATORY_TRACT | Status: DC | PRN
  Administered 2017-05-29 – 2017-05-31 (×2): 3 mL via RESPIRATORY_TRACT
  Filled 2017-05-28 (×2): qty 3

## 2017-05-28 MED ORDER — ASPIRIN 81 MG PO CHEW: 324.0000 mg | CHEWABLE_TABLET | ORAL | Status: DC

## 2017-05-28 MED ORDER — IOPAMIDOL (ISOVUE-370) INJECTION 76%
INTRAVENOUS | Status: AC
  Filled 2017-05-28: qty 50

## 2017-05-28 MED ORDER — TICAGRELOR 90 MG PO TABS
ORAL_TABLET | ORAL | Status: AC
  Filled 2017-05-28: qty 2

## 2017-05-28 MED ORDER — BIVALIRUDIN TRIFLUOROACETATE 250 MG IV SOLR
1.7500 mg/kg/h | INTRAVENOUS | Status: AC
  Filled 2017-05-28 (×2): qty 250

## 2017-05-28 MED ORDER — METOPROLOL TARTRATE 5 MG/5ML IV SOLN
INTRAVENOUS | Status: AC
  Filled 2017-05-28: qty 5

## 2017-05-28 MED ORDER — HYDROCODONE-ACETAMINOPHEN 5-325 MG PO TABS
1.0000 | ORAL_TABLET | Freq: Three times a day (TID) | ORAL | Status: DC
  Administered 2017-05-28 – 2017-06-01 (×14): 2 via ORAL
  Filled 2017-05-28 (×14): qty 2

## 2017-05-28 MED ORDER — HEPARIN (PORCINE) IN NACL 2-0.9 UNIT/ML-% IJ SOLN
INTRAMUSCULAR | Status: AC | PRN
  Administered 2017-05-28: 1000 mL

## 2017-05-28 MED ORDER — SODIUM CHLORIDE 0.9 % IV SOLN
INTRAVENOUS | Status: AC | PRN
  Administered 2017-05-28: 100 mL/h via INTRAVENOUS

## 2017-05-28 MED ORDER — HYDRALAZINE HCL 20 MG/ML IJ SOLN: 5.0000 mg | INTRAMUSCULAR | Status: AC | PRN

## 2017-05-28 MED ORDER — LIDOCAINE HCL (PF) 1 % IJ SOLN
INTRAMUSCULAR | Status: AC
  Filled 2017-05-28: qty 30

## 2017-05-28 MED ORDER — NITROGLYCERIN 0.4 MG SL SUBL: 0.4000 mg | SUBLINGUAL_TABLET | SUBLINGUAL | Status: DC | PRN

## 2017-05-28 MED ORDER — AMIODARONE HCL 150 MG/3ML IV SOLN
INTRAVENOUS | Status: DC | PRN
  Administered 2017-05-28: 300 mg via INTRAVENOUS

## 2017-05-28 MED ORDER — INSULIN ASPART 100 UNIT/ML ~~LOC~~ SOLN
0.0000 [IU] | Freq: Every day | SUBCUTANEOUS | Status: DC
  Administered 2017-05-31: 2 [IU] via SUBCUTANEOUS

## 2017-05-28 MED ORDER — SODIUM CHLORIDE 0.9 % IV SOLN: 250.0000 mL | INTRAVENOUS | Status: DC | PRN

## 2017-05-28 MED ORDER — INSULIN ASPART 100 UNIT/ML ~~LOC~~ SOLN
0.0000 [IU] | Freq: Three times a day (TID) | SUBCUTANEOUS | Status: DC
  Administered 2017-05-28: 15 [IU] via SUBCUTANEOUS
  Administered 2017-05-29: 3 [IU] via SUBCUTANEOUS
  Administered 2017-05-29 (×2): 5 [IU] via SUBCUTANEOUS
  Administered 2017-05-30 – 2017-05-31 (×4): 3 [IU] via SUBCUTANEOUS
  Administered 2017-05-31 – 2017-06-01 (×3): 5 [IU] via SUBCUTANEOUS
  Administered 2017-06-01: 3 [IU] via SUBCUTANEOUS
  Administered 2017-06-01 – 2017-06-02 (×2): 5 [IU] via SUBCUTANEOUS
  Administered 2017-06-02 (×2): 3 [IU] via SUBCUTANEOUS

## 2017-05-28 MED ORDER — AMIODARONE HCL IN DEXTROSE 360-4.14 MG/200ML-% IV SOLN
INTRAVENOUS | Status: AC | PRN
  Administered 2017-05-28: 60 mg/h via INTRAVENOUS

## 2017-05-28 MED ORDER — SODIUM CHLORIDE 0.9 % IV SOLN: INTRAVENOUS | Status: DC

## 2017-05-28 MED ORDER — AMIODARONE HCL IN DEXTROSE 360-4.14 MG/200ML-% IV SOLN
INTRAVENOUS | Status: AC
  Filled 2017-05-28: qty 200

## 2017-05-28 MED ORDER — IOPAMIDOL (ISOVUE-370) INJECTION 76%
INTRAVENOUS | Status: DC | PRN
  Administered 2017-05-28: 150 mL via INTRA_ARTERIAL

## 2017-05-28 MED ORDER — TICAGRELOR 90 MG PO TABS: 90.0000 mg | ORAL_TABLET | Freq: Two times a day (BID) | ORAL | Status: DC

## 2017-05-28 MED ORDER — ASPIRIN EC 81 MG PO TBEC: 81.0000 mg | DELAYED_RELEASE_TABLET | Freq: Every day | ORAL | Status: DC

## 2017-05-28 MED ORDER — ATORVASTATIN CALCIUM 20 MG PO TABS
20.0000 mg | ORAL_TABLET | Freq: Every day | ORAL | Status: DC
  Administered 2017-05-28 – 2017-05-29 (×2): 20 mg via ORAL
  Filled 2017-05-28 (×2): qty 1

## 2017-05-28 MED ORDER — ONDANSETRON HCL 4 MG/2ML IJ SOLN
4.0000 mg | Freq: Four times a day (QID) | INTRAMUSCULAR | Status: DC | PRN
  Administered 2017-05-28 – 2017-05-31 (×3): 4 mg via INTRAVENOUS
  Filled 2017-05-28 (×3): qty 2

## 2017-05-28 MED ORDER — BIVALIRUDIN TRIFLUOROACETATE 250 MG IV SOLR
INTRAVENOUS | Status: AC
  Filled 2017-05-28: qty 250

## 2017-05-28 MED ORDER — SODIUM CHLORIDE 0.9 % IV SOLN
INTRAVENOUS | Status: AC
  Administered 2017-05-28: 07:00:00 via INTRAVENOUS

## 2017-05-28 MED ORDER — HYDRALAZINE HCL 20 MG/ML IJ SOLN: 10.0000 mg | INTRAMUSCULAR | Status: DC | PRN

## 2017-05-28 MED ORDER — AMIODARONE HCL 200 MG PO TABS
400.0000 mg | ORAL_TABLET | Freq: Two times a day (BID) | ORAL | Status: DC
  Administered 2017-05-28 – 2017-05-29 (×2): 400 mg via ORAL
  Filled 2017-05-28 (×2): qty 2

## 2017-05-28 MED ORDER — TICAGRELOR 90 MG PO TABS
ORAL_TABLET | ORAL | Status: DC | PRN
  Administered 2017-05-28: 180 mg via ORAL

## 2017-05-28 MED ORDER — HEPARIN SODIUM (PORCINE) 5000 UNIT/ML IJ SOLN
4000.0000 [IU] | Freq: Once | INTRAMUSCULAR | Status: DC
  Filled 2017-05-28: qty 0.8

## 2017-05-28 MED ORDER — NITROGLYCERIN 1 MG/10 ML FOR IR/CATH LAB
INTRA_ARTERIAL | Status: AC
  Filled 2017-05-28: qty 10

## 2017-05-28 MED ORDER — LIDOCAINE HCL (PF) 1 % IJ SOLN
INTRAMUSCULAR | Status: DC | PRN
  Administered 2017-05-28: 25 mL via SUBCUTANEOUS

## 2017-05-28 MED ORDER — SODIUM CHLORIDE 0.9 % IV SOLN
INTRAVENOUS | Status: AC | PRN
  Administered 2017-05-28 (×2): 1.75 mg/kg/h via INTRAVENOUS

## 2017-05-28 MED ORDER — BIVALIRUDIN BOLUS VIA INFUSION - CUPID
INTRAVENOUS | Status: DC | PRN
  Administered 2017-05-28: 75.75 mg via INTRAVENOUS

## 2017-05-28 MED ORDER — AMIODARONE HCL IN DEXTROSE 360-4.14 MG/200ML-% IV SOLN
60.0000 mg/h | INTRAVENOUS | Status: DC
  Administered 2017-05-28: 60 mg/h via INTRAVENOUS
  Filled 2017-05-28: qty 200

## 2017-05-28 MED ORDER — METOPROLOL TARTRATE 5 MG/5ML IV SOLN
INTRAVENOUS | Status: DC | PRN
  Administered 2017-05-28: 2.5 mg via INTRAVENOUS

## 2017-05-28 MED ORDER — PERFLUTREN LIPID MICROSPHERE
INTRAVENOUS | Status: AC
  Filled 2017-05-28: qty 10

## 2017-05-28 MED ORDER — IOPAMIDOL (ISOVUE-370) INJECTION 76%
INTRAVENOUS | Status: AC
  Filled 2017-05-28: qty 100

## 2017-05-28 MED ORDER — SODIUM CHLORIDE 0.9% FLUSH
3.0000 mL | Freq: Two times a day (BID) | INTRAVENOUS | Status: DC
  Administered 2017-05-28: 10 mL via INTRAVENOUS
  Administered 2017-05-29 – 2017-06-02 (×8): 3 mL via INTRAVENOUS

## 2017-05-28 MED ORDER — ASPIRIN 300 MG RE SUPP
300.0000 mg | RECTAL | Status: DC
  Filled 2017-05-28: qty 1

## 2017-05-28 MED ORDER — AMIODARONE LOAD VIA INFUSION
150.0000 mg | Freq: Once | INTRAVENOUS | Status: DC
  Filled 2017-05-28: qty 83.34

## 2017-05-28 MED ORDER — HEPARIN (PORCINE) IN NACL 2-0.9 UNIT/ML-% IJ SOLN
INTRAMUSCULAR | Status: AC
  Filled 2017-05-28: qty 1000

## 2017-05-28 MED ORDER — ASPIRIN EC 81 MG PO TBEC
81.0000 mg | DELAYED_RELEASE_TABLET | Freq: Every day | ORAL | Status: DC
  Administered 2017-05-29 – 2017-06-02 (×5): 81 mg via ORAL
  Filled 2017-05-28 (×5): qty 1

## 2017-05-28 MED ORDER — TICAGRELOR 90 MG PO TABS
90.0000 mg | ORAL_TABLET | Freq: Two times a day (BID) | ORAL | Status: DC
Start: 2017-05-28 — End: 2017-06-02
  Administered 2017-05-28 – 2017-06-02 (×11): 90 mg via ORAL
  Filled 2017-05-28 (×11): qty 1

## 2017-05-28 MED ORDER — VERAPAMIL HCL 2.5 MG/ML IV SOLN
INTRAVENOUS | Status: AC
  Filled 2017-05-28: qty 2

## 2017-05-28 MED ORDER — AMIODARONE HCL 150 MG/3ML IV SOLN
INTRAVENOUS | Status: AC
  Filled 2017-05-28: qty 3

## 2017-05-28 MED ORDER — ACETAMINOPHEN 325 MG PO TABS: 650.0000 mg | ORAL_TABLET | ORAL | Status: DC | PRN

## 2017-05-28 MED ORDER — IOPAMIDOL (ISOVUE-370) INJECTION 76%
INTRAVENOUS | Status: AC
Start: 2017-05-28 — End: 2017-05-28
  Filled 2017-05-28: qty 125

## 2017-05-28 MED ORDER — PERFLUTREN LIPID MICROSPHERE
1.0000 mL | INTRAVENOUS | Status: AC | PRN
  Administered 2017-05-28: 2 mL via INTRAVENOUS
  Filled 2017-05-28: qty 10

## 2017-05-28 MED ORDER — MORPHINE SULFATE (PF) 4 MG/ML IV SOLN
2.0000 mg | INTRAVENOUS | Status: DC | PRN
  Administered 2017-05-28 – 2017-06-02 (×15): 2 mg via INTRAVENOUS
  Filled 2017-05-28 (×17): qty 1

## 2017-05-28 MED ORDER — SODIUM CHLORIDE 0.9% FLUSH
3.0000 mL | INTRAVENOUS | Status: DC | PRN
  Administered 2017-06-01: 3 mL via INTRAVENOUS
  Filled 2017-05-28: qty 3

## 2017-05-28 MED FILL — Heparin Sodium (Porcine) Inj 5000 Unit/ML: INTRAMUSCULAR | Qty: 1 | Status: AC

## 2017-05-28 MED FILL — Morphine Sulfate Inj 4 MG/ML: INTRAMUSCULAR | Qty: 1 | Status: AC

## 2017-05-28 SURGICAL SUPPLY — 18 items
BALLN EUPHORA RX 2.0X15 (BALLOONS) ×2
BALLN ~~LOC~~ EMERGE MR 3.0X30 (BALLOONS) ×2
BALLOON EUPHORA RX 2.0X15 (BALLOONS) ×1 IMPLANT
BALLOON ~~LOC~~ EMERGE MR 3.0X30 (BALLOONS) ×1 IMPLANT
CATH INFINITI 5FR MULTPACK ANG (CATHETERS) ×2 IMPLANT
CATH LAUNCHER 6FR EBU3.5 (CATHETERS) ×2 IMPLANT
CATH LAUNCHER 6FR JR4 (CATHETERS) ×2 IMPLANT
KIT ENCORE 26 ADVANTAGE (KITS) ×2 IMPLANT
KIT HEART LEFT (KITS) ×2 IMPLANT
PACK CARDIAC CATHETERIZATION (CUSTOM PROCEDURE TRAY) ×2 IMPLANT
SHEATH PINNACLE 6F 10CM (SHEATH) ×2 IMPLANT
STENT SYNERGY DES 2.5X16 (Permanent Stent) ×2 IMPLANT
STENT SYNERGY DES 2.5X38 (Permanent Stent) ×2 IMPLANT
TRANSDUCER W/STOPCOCK (MISCELLANEOUS) ×2 IMPLANT
TUBING CIL FLEX 10 FLL-RA (TUBING) ×2 IMPLANT
WIRE ASAHI FIELDER XT 190CM (WIRE) ×6 IMPLANT
WIRE ASAHI PROWATER 180CM (WIRE) ×2 IMPLANT
WIRE EMERALD 3MM-J .035X150CM (WIRE) ×2 IMPLANT

## 2017-05-28 NOTE — ED Notes (Signed)
Pt left with [facility] 

## 2017-05-28 NOTE — Progress Notes (Signed)
Progress Note  Patient Name: Joseph Manning Date of Encounter: 05/28/2017  Primary Cardiologist: Irish Lack   Subjective   Feeling better this morning. No significant chest pain. Sheath remains in place. Angiomax has been turned off. Vital signs are stable.  Inpatient Medications    Scheduled Meds: . amiodarone  150 mg Intravenous Once  . aspirin      . aspirin  324 mg Oral NOW   Or  . aspirin  300 mg Rectal NOW  . [START ON 05/29/2017] aspirin EC  81 mg Oral Daily  . atorvastatin  20 mg Oral Daily  . heparin  4,000 Units Intravenous Once  . pantoprazole  40 mg Oral BID  . sodium chloride flush  3 mL Intravenous Q12H  . ticagrelor  90 mg Oral BID   Continuous Infusions: . sodium chloride    . sodium chloride 75 mL/hr at 05/28/17 0700  . sodium chloride    . amiodarone 60 mg/hr (05/28/17 0700)   Followed by  . amiodarone     PRN Meds: sodium chloride, acetaminophen, albuterol, hydrALAZINE, hydrALAZINE, labetalol, morphine injection, nitroGLYCERIN, ondansetron (ZOFRAN) IV, sodium chloride flush   Vital Signs    Vitals:   05/28/17 0600 05/28/17 0615 05/28/17 0630 05/28/17 0645  BP:      Pulse: 87 85 86 89  Resp: (!) 29 (!) 27 (!) 27 (!) 25  Temp:      TempSrc:      SpO2: 94% 96% 95% 97%  Weight:        Intake/Output Summary (Last 24 hours) at 05/28/17 0734 Last data filed at 05/28/17 0700  Gross per 24 hour  Intake           412.18 ml  Output                0 ml  Net           412.18 ml   Filed Weights   05/28/17 0300  Weight: 240 lb 4.8 oz (109 kg)    Telemetry    Sinus rhythm in the 90s. - Personally Reviewed  ECG    Normal sinus rhythm with inferolateral ST segment elevation - Personally Reviewed  Physical Exam   GEN: No acute distress.   Neck: No JVD Cardiac: RRR, no murmurs, rubs, or gallops.  Respiratory: Clear to auscultation bilaterally. GI: Soft, nontender, non-distended  MS: No edema; No deformity. Neuro:  Nonfocal  Psych: Normal  affect  Extremities-right common femoral sheath remains in place   Labs    Chemistry Recent Labs Lab 05/28/17 0030  NA 137  136  K 4.2  4.2  CL 102  101  CO2 22  22  GLUCOSE 328*  330*  BUN 33*  33*  CREATININE 1.60*  1.58*  CALCIUM 9.3  9.2  PROT 8.5*  ALBUMIN 4.2  AST 217*  ALT 72*  ALKPHOS 91  BILITOT 0.8  GFRNONAA 43*  44*  GFRAA 50*  51*  ANIONGAP 13  13     Hematology Recent Labs Lab 05/28/17 0030  WBC 21.9*  RBC 4.72  HGB 15.0  HCT 43.5  MCV 92.2  MCH 31.8  MCHC 34.5  RDW 12.9  PLT 349    Cardiac Enzymes Recent Labs Lab 05/28/17 0030 05/28/17 0354  TROPONINI >65.00* >65.00*    Recent Labs Lab 05/28/17 0038  TROPIPOC >35.00*     BNPNo results for input(s): BNP, PROBNP in the last 168 hours.   DDimer No results  for input(s): DDIMER in the last 168 hours.   Radiology    Dg Chest Port 1 View  Result Date: 05/28/2017 CLINICAL DATA:  Chest and back pain x2 days.  Dyspnea. EXAM: PORTABLE CHEST 1 VIEW COMPARISON:  01/31/2016 FINDINGS: AP portable semi upright view of the chest. Mild enlargement of the cardiac silhouette may be due to the portable technique. No aortic aneurysm. No pneumonic consolidation, CHF, effusion or pneumothorax. The left costophrenic angle is excluded on this study. No acute nor suspicious osseous abnormalities. There is osteoarthritic joint space narrowing and spurring about the right AC and glenohumeral joints. IMPRESSION: Marginally enlarged cardiac silhouette likely from the AP projection. No acute pulmonary disease. Electronically Signed   By: Ashley Royalty M.D.   On: 05/28/2017 01:10    Cardiac Studies   Cardiac catheterization/PCI and stent-(05/28/17)  Conclusion     Mid LAD lesion, 60 %stenosed.  Mid LAD to Dist LAD lesion, 50 %stenosed.  Prox Cx to Mid Cx lesion, 100 %stenosed.  Post intervention, there is a 70% residual stenosis.  Prox RCA to Dist RCA lesion, 100 %stenosed.  Post  intervention, there is a 0% residual stenosis.  A stent was successfully placed.     Patient Profile     66 y.o. male status post inferior STEMI beginning yesterday status post complex RCA PCI and stenting with attempt at circumflex PTCA. He has remained hemodynamic stable. He did have ventricular tachycardia during the procedure and has remained on amiodarone that without further arrhythmias. His troponins are greater than 65. A 2-D echocardiogram is pending. Sheath will be pulled later this morning.  Assessment & Plan    1: Inferior STEMI-postop day 0 inferolateral STEMI treated with PCI stenting of a large dominant RCA with attempt at PTCA of an occluded circumflex. Patient has moderate and not critical LAD disease. A 2-D echo is pending for LV function. His troponins are greater than 65. His Angiomax has been turned off and the sheath will be removed this morning he will need dual antiplatelet therapy indefinitely.  2: Essential hypertension-not on antihypertensive medications as an outpatient. Blood pressure since his hospitalization has remained stable. He will end up being on low-dose beta blocker. In all likelihood, because of his moderate renal insufficiency, he probably will not be on an ACE inhibitor.  Angelina Sheriff, MD  05/28/2017, 7:34 AM

## 2017-05-28 NOTE — Progress Notes (Signed)
BRILINTA 90 MG BID   COVER- YES  CO-PAY- $ 3.70  TIER- 3 DRUG  PRIOR APPROVAL- [place]   PATIENT HAS LOWE INCOME SUBSITYNE   MEDICA[place] - EFF DATE 12-11-2010  CO-PAY- $ 3.70   PHARMACY : ANY RETAIL

## 2017-05-28 NOTE — Care Management Note (Addendum)
Case Management Note  Patient Details  Name: Joseph Manning MRN: [id number] Date of Birth: 1951/06/05  Subjective/Objective:  From home alone, pta indep,  S/p coronary stent intervention, will be on brilinta, NCM gave patient the 30 day savings card, he has PCP [name] and he has medication coverage, co pay of  $3.70. He states his daughter , Joseph Manning will be checking on him when he goes home. He will go to [facility] to pick up 30 day free on [address] [place].  They have 40 pills in stock , they will have to order the rest.                       Action/Plan: NCM will follow for dc needs.   Expected Discharge Date:                  Expected Discharge Plan:  Home/Self Care  In-House Referral:     Discharge planning Services  CM Consult  Post Acute Care Choice:    Choice offered to:     DME Arranged:    DME Agency:     HH Arranged:    [place] Agency:     Status of Service:  Completed, signed off  If discussed at [employer] of Stay Meetings, dates discussed:    Additional Comments:  Zenon Mayo, RN 05/28/2017, 1:07 PM

## 2017-05-28 NOTE — ED Provider Notes (Addendum)
[facility] DEPT Provider Note   CSN: [id number] Arrival date & time: 05/27/17  2358  By signing my name below, I, Marcello Moores, attest that this documentation has been prepared under the direction and in the presence of Deno Etienne, DO. Electronically Signed: Marcello Moores, ED Scribe. 05/28/17. 12:31 AM.  History   Chief Complaint Chief Complaint  Patient presents with  . Chest Pain  . Shortness of Breath   The history is provided by the patient. No language interpreter was used.  Illness  This is a new problem. The current episode started 2 days ago. The problem occurs constantly. The problem has been rapidly worsening. Associated symptoms include chest pain and shortness of breath. Pertinent negatives include no abdominal pain and no headaches. The symptoms are aggravated by walking and exertion. Nothing relieves the symptoms. He has tried nothing for the symptoms. The treatment provided no relief.   HPI Comments: Joseph Manning is a 66 y.o. male with a h/x of unilateral deafness, who presents to the Emergency Department complaining of constant, gradually worsening lower chest pain with associated SOB that began 3 days ago. He reports that his pain radiates to his back, and central abdomen. Palpation and exertion exacerbates his chest pain. He has a PMHx of COPD, DM, HTN, and COPD. He also is a current smoker.   Past Medical History:  Diagnosis Date  . Anxiety   . Arthritis   . COPD (chronic obstructive pulmonary disease) ([place])   . Depression   . Diabetes mellitus without complication ([place])   . Hypertension   . Tubular adenoma of colon 12/1999    Patient Active Problem List   Diagnosis Date Noted  . Elevated troponin 01/31/2016  . Pleuritic chest pain 01/31/2016  . Type 2 diabetes mellitus with hyperglycemia ([place]) 01/31/2016  . COPD, moderate ([place]) 05/01/2015  . Smoking 05/01/2015  . Community acquired pneumonia 01/01/2015    Class: Acute  . Pneumonia 01/01/2015   . Pleural effusion, right 01/01/2015  . Sepsis ([place]) 01/01/2015  . Acute kidney insufficiency 01/01/2015  . AKI (acute kidney injury) ([place])   . CAP (community acquired pneumonia)   . Hypotensive episode   . Lactic acidosis   . Loculated pleural effusion   . COPD exacerbation ([place]) 08/27/2014  . Right upper quadrant pain 02/09/2013  . Chronic back pain 12/30/2012    Class: Chronic  . Duodenitis 12/25/2012  . Leukocytosis 12/25/2012  . Smoker 12/25/2012  . Diabetes mellitus, type II ([place]) 12/25/2012  . Hypertension 12/25/2012  . Anxiety 12/25/2012    Past Surgical History:  Procedure Laterality Date  . CERVICAL DISC SURGERY    . ESOPHAGOGASTRODUODENOSCOPY N/A 12/28/2012   Procedure: ESOPHAGOGASTRODUODENOSCOPY (EGD);  Surgeon: Milus Banister, MD;  Location: [facility];  Service: Endoscopy;  Laterality: N/A;  . JOINT REPLACEMENT Left   . KNEE ARTHROSCOPY Left        Home Medications    Prior to Admission medications   Medication Sig Start Date End Date Taking? Authorizing Provider  albuterol (PROVENTIL HFA;VENTOLIN HFA) 108 (90 BASE) MCG/ACT inhaler Inhale 2 puffs into the lungs every 6 (six) hours as needed for wheezing or shortness of breath. 05/01/15   Brand Males, MD  aspirin EC 81 MG EC tablet Take 1 tablet (81 mg total) by mouth daily. 02/02/16   Mikhail, Velta Addison, DO  atorvastatin (LIPITOR) 20 MG tablet Take 1 tablet (20 mg total) by mouth daily. Patient not taking: Reported on 03/04/2016 02/02/16   Cristal Ford, DO  clindamycin (CLEOCIN) 150 MG capsule Take 3 capsules (450 mg total) by mouth 3 (three) times daily. 11/10/16   Lawyer, Harrell Gave, PA-C  HYDROcodone-acetaminophen (NORCO/VICODIN) 5-325 MG per tablet Take 1-2 tablets by mouth 3 (three) times daily. He takes two tablets in the morning and evening and one tablet midday. 12/06/14   [provider]  hydrocortisone (ANUSOL-HC) 2.5 % rectal cream Apply rectally 2 times daily until hemorrhoid pain  improves 03/04/16   Little, Wenda Overland, MD  NOVOLOG MIX 70/30 FLEXPEN (70-30) 100 UNIT/ML FlexPen Inject 35-50 Units as directed See admin instructions. Inject 45 units every morning and 50 units every evening. 01/25/16   [provider]  pantoprazole (PROTONIX) 40 MG tablet Take 1 tablet (40 mg total) by mouth 2 (two) times daily. 02/02/16   Mikhail, Velta Addison, DO  polyethylene glycol powder (GLYCOLAX/MIRALAX) powder Drink 1 capful mixed in beverage one to three times daily as needed for constipation 03/04/16   Little, Wenda Overland, MD  SPIRIVA HANDIHALER 18 MCG inhalation capsule Place 1 capsule into inhaler and inhale daily. 01/14/16   [provider]  SYMBICORT 160-4.5 MCG/ACT inhaler Inhale 1 puff into the lungs 2 (two) times daily. 01/14/16   [provider]    Family History Family History  Problem Relation Age of Onset  . Alzheimer's disease Mother   . Cancer Father        unknown type    Social History Social History  Substance Use Topics  . Smoking status: Current Every Day Smoker    Packs/day: 1.00    Years: 50.00    Types: Cigarettes  . Smokeless tobacco: Never Used  . Alcohol use No     Allergies   Fentanyl and Versed [midazolam]   Review of Systems Review of Systems  Constitutional: Negative for chills and fever.  HENT: Negative for congestion and facial swelling.   Eyes: Negative for discharge and visual disturbance.  Respiratory: Positive for shortness of breath.   Cardiovascular: Positive for chest pain. Negative for palpitations.  Gastrointestinal: Negative for abdominal pain, diarrhea and vomiting.  Musculoskeletal: Negative for arthralgias and myalgias.  Skin: Negative for color change and rash.  Neurological: Negative for tremors, syncope and headaches.  Psychiatric/Behavioral: Negative for confusion and dysphoric mood.     Physical Exam Updated Vital Signs BP 112/81 (BP Location: Left Arm)   Pulse (!) 116   Temp (!) 97.5 F  (36.4 C) (Oral)   Resp 20   SpO2 98%   Physical Exam  Constitutional: He is oriented to person, place, and time. He appears well-developed and well-nourished.  Grayish color.  HENT:  Head: Normocephalic and atraumatic.  Eyes: Pupils are equal, round, and reactive to light. EOM are normal.  Neck: Normal range of motion. Neck supple. No JVD present.  Cardiovascular: Regular rhythm.  Tachycardia present.  Exam reveals no gallop and no friction rub.   No murmur heard. Pulses:      Radial pulses are 2+ on the right side, and 2+ on the left side.  Pulmonary/Chest: Tachypnea noted. No respiratory distress. He has no wheezes.  Abdominal: He exhibits no distension. There is no rebound and no guarding.  Musculoskeletal: Normal range of motion.  Neurological: He is alert and oriented to person, place, and time.  Skin: No rash noted. No pallor.  Psychiatric: He has a normal mood and affect. His behavior is normal.  Nursing note and vitals reviewed.    ED Treatments / Results   DIAGNOSTIC STUDIES: Oxygen Saturation is  98% on RA, normal by my interpretation.   COORDINATION OF CARE: 12:24 AM-Discussed next steps with pt. Pt verbalized understanding and is agreeable with the plan.   Labs (all labs ordered are listed, but only abnormal results are displayed) Labs Reviewed  CBC - Abnormal; Notable for the following:       Result Value   WBC 21.9 (*)    All other components within normal limits  I-STAT TROPONIN, ED - Abnormal; Notable for the following:    Troponin i, poc >35.00 (*)    All other components within normal limits  BASIC METABOLIC PANEL  PROTIME-INR  APTT  COMPREHENSIVE METABOLIC PANEL  TROPONIN I  LIPID PANEL    EKG  EKG Interpretation  Date/Time:  Thursday May 28 2017 00:13:42 EDT Ventricular Rate:  115 PR Interval:    QRS Duration: 117 QT Interval:  307 QTC Calculation: 425 R Axis:   46 Text Interpretation:  Sinus tachycardia Nonspecific intraventricular  conduction delay Inferoposterior infarct, acute (RCA) Lateral infarct, acute Probable RV involvement, suggest recording right precordial leads STEMI Confirmed by Deno Etienne [phone]) on 05/28/2017 12:24:05 AM       Radiology No results found.  Procedures Procedures (including critical care time)  Medications Ordered in ED Medications  0.9 %  sodium chloride infusion (not administered)  heparin injection 60 Units/kg (not administered)  aspirin 81 MG chewable tablet (not administered)  aspirin chewable tablet 324 mg (324 mg Oral Given 05/28/17 0028)     Initial Impression / Assessment and Plan / ED Course  I have reviewed the triage vital signs and the nursing notes.  Pertinent labs & imaging results that were available during my care of the patient were reviewed by me and considered in my medical decision making (see chart for details).     66 yo M With a chief complaint of chest pain and shortness of breath. Going on for the past couple days. Since typical in nature. Arrived with an EKG showing an inferior lateral STEMI. Code STEMI initiated. Given heparin and aspirin. Discussed with cardiology at [facility]. Transferred emergently to the Cath Lab.  CRITICAL CARE Performed by: Cecilio Asper   Total critical care time: 35 minutes  Critical care time was exclusive of separately billable procedures and treating other patients.  Critical care was necessary to treat or prevent imminent or life-threatening deterioration.  Critical care was time spent personally by me on the following activities: development of treatment plan with patient and/or surrogate as well as nursing, discussions with consultants, evaluation of patient's response to treatment, examination of patient, obtaining history from patient or surrogate, ordering and performing treatments and interventions, ordering and review of laboratory studies, ordering and review of radiographic studies, pulse oximetry and  re-evaluation of patient's condition.   Final Clinical Impressions(s) / ED Diagnoses   Final diagnoses:  ST elevation myocardial infarction involving right coronary artery [facility])    New Prescriptions New Prescriptions   No medications on file   I personally performed the services described in this documentation, which was scribed in my presence. The recorded information has been reviewed and is accurate.      Deno Etienne, DO 05/28/17 [place], [place], DO 05/28/17 [phone]

## 2017-05-28 NOTE — H&P (Signed)
Cardiology History & Physical    Patient ID: HOLT WOOLBRIGHT MRN: [id number], DOB: 08-May-1951 Date of Encounter: 05/28/2017, 1:10 AM Primary Physician: [name], MD  Reason for Admission: STEMI   HPI: Joseph Manning is a 66 y.o. male with history of HTN, DM2, and COPD (current smoker) who presents with CP, found to have inferior STEMI.  Pt first noted onset of stuttering SSCP 2 days PTA, with associated SOB.  It dramatically intensified on the evening of admission and pt presented to the [facility] ED.  There, ECG showed inferior STE and Q waves, with troponin > assay.  He was tachycardic and normotensive throughout.  He was given ASA 324 mg and heparin 4000 units IV en route to the [facility] ED.  Past Medical History:  Diagnosis Date  . Anxiety   . Arthritis   . COPD (chronic obstructive pulmonary disease) ([place])   . Depression   . Diabetes mellitus without complication ([place])   . Hypertension   . Tubular adenoma of colon 12/1999     Surgical History:  Past Surgical History:  Procedure Laterality Date  . CERVICAL DISC SURGERY    . ESOPHAGOGASTRODUODENOSCOPY N/A 12/28/2012   Procedure: ESOPHAGOGASTRODUODENOSCOPY (EGD);  Surgeon: Milus Banister, MD;  Location: [facility];  Service: Endoscopy;  Laterality: N/A;  . JOINT REPLACEMENT Left   . KNEE ARTHROSCOPY Left      Home Meds: Prior to Admission medications   Medication Sig Start Date End Date Taking? Authorizing Provider  albuterol (PROVENTIL HFA;VENTOLIN HFA) 108 (90 BASE) MCG/ACT inhaler Inhale 2 puffs into the lungs every 6 (six) hours as needed for wheezing or shortness of breath. 05/01/15   Brand Males, MD  aspirin EC 81 MG EC tablet Take 1 tablet (81 mg total) by mouth daily. 02/02/16   Mikhail, Velta Addison, DO  atorvastatin (LIPITOR) 20 MG tablet Take 1 tablet (20 mg total) by mouth daily. Patient not taking: Reported on 03/04/2016 02/02/16   Cristal Ford, DO  clindamycin (CLEOCIN) 150 MG capsule Take 3  capsules (450 mg total) by mouth 3 (three) times daily. 11/10/16   Lawyer, Harrell Gave, PA-C  HYDROcodone-acetaminophen (NORCO/VICODIN) 5-325 MG per tablet Take 1-2 tablets by mouth 3 (three) times daily. He takes two tablets in the morning and evening and one tablet midday. 12/06/14   [provider]  hydrocortisone (ANUSOL-HC) 2.5 % rectal cream Apply rectally 2 times daily until hemorrhoid pain improves 03/04/16   Little, Wenda Overland, MD  NOVOLOG MIX 70/30 FLEXPEN (70-30) 100 UNIT/ML FlexPen Inject 35-50 Units as directed See admin instructions. Inject 45 units every morning and 50 units every evening. 01/25/16   [provider]  pantoprazole (PROTONIX) 40 MG tablet Take 1 tablet (40 mg total) by mouth 2 (two) times daily. 02/02/16   Mikhail, Velta Addison, DO  polyethylene glycol powder (GLYCOLAX/MIRALAX) powder Drink 1 capful mixed in beverage one to three times daily as needed for constipation 03/04/16   Little, Wenda Overland, MD  SPIRIVA HANDIHALER 18 MCG inhalation capsule Place 1 capsule into inhaler and inhale daily. 01/14/16   [provider]  SYMBICORT 160-4.5 MCG/ACT inhaler Inhale 1 puff into the lungs 2 (two) times daily. 01/14/16   [provider]    Allergies:  Allergies  Allergen Reactions  . Fentanyl Other (See Comments)    unknown  . Versed [Midazolam] Other (See Comments)    unknown    Social History   Social History  . Marital status: Legally Separated  Spouse name: N/A  . Number of children: 3  . Years of education: N/A   Occupational History  . Not on file.   Social History Main Topics  . Smoking status: Current Every Day Smoker    Packs/day: 1.00    Years: 50.00    Types: Cigarettes  . Smokeless tobacco: Never Used  . Alcohol use No  . Drug use: No  . Sexual activity: Not on file   Other Topics Concern  . Not on file   Social History Narrative  . No narrative on file     Family History  Problem Relation Age of Onset  .  Alzheimer's disease Mother   . Cancer Father        unknown type    Review of Systems: All other systems reviewed and are otherwise negative except as noted above.  Labs:   Lab Results  Component Value Date   WBC 21.9 (H) 05/28/2017   HGB 15.0 05/28/2017   HCT 43.5 05/28/2017   MCV 92.2 05/28/2017   PLT 349 05/28/2017    Recent Labs Lab 05/28/17 0030  NA 136  K 4.2  CL 101  CO2 22  BUN 33*  CREATININE 1.58*  CALCIUM 9.2  GLUCOSE 330*   No results for input(s): CKTOTAL, CKMB, TROPONINI in the last 72 hours. Lab Results  Component Value Date   CHOL 205 (H) 02/01/2016   HDL 38 (L) 02/01/2016   LDLCALC 133 (H) 02/01/2016   TRIG 170 (H) 02/01/2016   Lab Results  Component Value Date   DDIMER 0.27 01/31/2016    Radiology/Studies:  No results found. Wt Readings from Last 3 Encounters:  01/31/16 101.3 kg (223 lb 5.2 oz)  12/27/15 103.4 kg (228 lb)  05/01/15 108.9 kg (240 lb)    EKG: Inferior STEMI.  Physical Exam: Blood pressure 112/81, pulse (!) 116, temperature (!) 97.5 F (36.4 C), temperature source Oral, resp. rate 20, SpO2 98 %. There is no height or weight on file to calculate BMI. General: In moderate distress due to CP Head: Normocephalic, atraumatic, sclera non-icteric, no xanthomas, nares are without discharge.  Neck: Negative for carotid bruits. JVD not elevated. Lungs: Clear bilaterally to auscultation without wheezes, rales, or rhonchi. Breathing is unlabored. Heart: Tachycardic, regular, with normal S1 S2. No murmurs, rubs, or gallops appreciated. Abdomen: Soft, non-tender, non-distended with normoactive bowel sounds. No hepatomegaly. No rebound/guarding. No obvious abdominal masses. Msk:  Strength and tone appear normal for age. Extremities: No clubbing or cyanosis. No edema.  Distal pedal pulses are 2+ and equal bilaterally. Neuro: Alert and oriented X 3. No focal deficit. No facial asymmetry. Moves all extremities spontaneously. Psych:   Responds to questions appropriately with a normal affect.    Assessment and Plan   66 y.o. male with history of HTN, DM2, and COPD (current smoker) who presents with CP, found to have inferior STEMI.  We will plan to take him emergently to the cath lab for coronary angiography and possible intervention.  Signed, Doylene Canning, MD 05/28/2017, 1:10 AM

## 2017-05-28 NOTE — Progress Notes (Addendum)
Repeatedly stressed to patient importance of keeping right leg straight. Potential complications that could occur should patient continue to bend leg and roll in bed explained and verbalized back. Patient still noncompliant with keeping leg straight. Leg immobolizer ordered. Will continue to monitor.

## 2017-05-28 NOTE — Progress Notes (Signed)
Pt experiencing phlebitis d/t IV amiodarone -- one IV occluded and the other developed a blister under the skin.  RN paged Radford Pax MD about this issue.  RN let MD know that this RN is not aware of any vtach episodes today and asked about switching to PO amiodarone to avoid this IV intolerance issue. MD gave verbal order with read back for 400 mg PO BID.  RN will continue to monitor patient closely.

## 2017-05-28 NOTE — Progress Notes (Signed)
Noted that blood sugars have been greater than 180 mg/dl.  Patient takes 70/30 mixed insulin 45 units every am and 50 units every pm at home. Recommend starting a portion of the 70/30 insulin home dose or starting Lantus 15-20 units daily. Recommend adding Novolog MODERATE TID & HS. Will continue to follow blood sugars while in the hospital.    Harvel Ricks RN BSN CDE Diabetes Coordinator Pager: [phone]  8am-5pm

## 2017-05-28 NOTE — Progress Notes (Signed)
Right femoral sheath pulled at 12:49 pm. Pressure was held to patients right groin for 18 minutes. No signs of bleeding or hematoma. Groin site level zero. Pt right pedal pulse +1. Pt instructed on importance of laying flat for 6 hours starting now. Pt instructed to hold pressure to right groin while coughing. Pt instructed to call RN if he felt any pain, wetness, or hematoma at the site immediately. Pt demonstrated understanding and was able to teach back this education. VSS. Will continue to monitor closely and frequently.  Lucius Conn, RN

## 2017-05-28 NOTE — ED Notes (Signed)
Pt arrived to [facility] ED trauma B, moved to stretcher and placed on ZOLL pads. Cath lab then called stating that they are ready for pt. Pt immediately escorted to cath lab by this RN, MD and [place] team. No time for VS in ED. Per [facility] report, pt was given 324 MG aspirin, 4 MG IV morphine and 4,000 units heparin prior to arrival at [facility] ED.

## 2017-05-28 NOTE — Progress Notes (Signed)
EMS stated no family present; they think patient drove himself to the hospital. Chaplain approached patient to inquire about family or if he wished for anyone to be notified.  Patient reached in pocket to retrieve a small flip cell phone, selected a number and asked chaplain to press Call button, which chaplain did.  Call went to voicemail. Chaplain did not leave VM.  During transport to [employer], chaplain inquired about who the person was. He stated it was his daughter, Ahman Dugdale (also listed as patient's emergency contact in [facility] records, albeit with a different number).  Chaplain placed powered-down cell phone in patient belongings bag and advised patient as much which he and tech acknowledged.  Offered brief words of encouragement and support.  Patient appeared to be in significant pain and to have shortness of breath.    Chaplain checked with RN about possibility of leaving message, and got agreement.  Upon calling # again, chaplain noted that VM message identified as belonging to "[name]"; chaplain left VM that patient was being admitted to [facility] and that patient had asked that a message be gotten to his daughter about his presence here, to please call hospital.     Willeen Cass:  [phone]. PLEASE NOTE THIS IS A DIFFERENT (WORKING) NUMBER THAN OUR RECORDS SHOW.   Please call as needed for follow-up support.  Luana Shu [phone]    05/28/17 0100  Clinical Encounter Type  Visited With Patient  Visit Type Initial;Social support  Referral From Care management  Consult/Referral To Chaplain  Stress Factors  Patient Stress Factors Health changes

## 2017-05-28 NOTE — Progress Notes (Signed)
  Echocardiogram 2D Echocardiogram has been performed.  Joseph Manning 05/28/2017, 12:13 PM

## 2017-05-28 NOTE — Progress Notes (Signed)
CRITICAL VALUE ALERT  Critical Value:  troponin  Date & Time Notied:  05/28/2017 0450  Provider Notified: Charkravartti  Orders Received/Actions taken: waiting call back.  Levon Hedger, RN

## 2017-05-28 NOTE — ED Notes (Signed)
Notified EDP,Floyd,MD. Pt. I-stat troponin results >35.00 and RN,Jenn made aware.

## 2017-05-28 NOTE — Progress Notes (Signed)
EKG CRITICAL VALUE     12 lead EKG performed.  Critical value noted.  Gertie Exon, RN notified.   Kylyn Sookram, CCT 05/28/2017 8:48 AM

## 2017-05-28 NOTE — ED Triage Notes (Signed)
Pt states he has had chest pain for the past two days with shortness of breath  Pt states he has pain in his upper quadrants also  Pt has nausea without vomiting   Pt states he cannot eat because he states it does not feel like it wants to go down

## 2017-05-29 ENCOUNTER — Other Ambulatory Visit: Payer: Self-pay

## 2017-05-29 DIAGNOSIS — I5043 Acute on chronic combined systolic (congestive) and diastolic (congestive) heart failure: Secondary | ICD-10-CM

## 2017-05-29 DIAGNOSIS — I472 Ventricular tachycardia: Secondary | ICD-10-CM

## 2017-05-29 DIAGNOSIS — I519 Heart disease, unspecified: Secondary | ICD-10-CM

## 2017-05-29 DIAGNOSIS — I2119 ST elevation (STEMI) myocardial infarction involving other coronary artery of inferior wall: Principal | ICD-10-CM

## 2017-05-29 LAB — GLUCOSE, CAPILLARY
GLUCOSE-CAPILLARY: 209 mg/dL — AB (ref 65–99)
GLUCOSE-CAPILLARY: 156 mg/dL — AB (ref 65–99)
Glucose-Capillary: 235 mg/dL — ABNORMAL HIGH (ref 65–99)

## 2017-05-29 LAB — BRAIN NATRIURETIC PEPTIDE: B Natriuretic Peptide: 214.4 pg/mL — ABNORMAL HIGH (ref 0.0–100.0)

## 2017-05-29 LAB — MAGNESIUM: Magnesium: 2.2 mg/dL (ref 1.7–2.4)

## 2017-05-29 MED ORDER — LEVALBUTEROL HCL 0.63 MG/3ML IN NEBU
0.6300 mg | INHALATION_SOLUTION | Freq: Three times a day (TID) | RESPIRATORY_TRACT | Status: DC
  Administered 2017-05-29 – 2017-05-31 (×5): 0.63 mg via RESPIRATORY_TRACT
  Filled 2017-05-29 (×5): qty 3

## 2017-05-29 MED ORDER — AMIODARONE HCL IN DEXTROSE 360-4.14 MG/200ML-% IV SOLN
30.0000 mg/h | INTRAVENOUS | Status: DC
  Administered 2017-05-29 – 2017-05-30 (×2): 30 mg/h via INTRAVENOUS
  Filled 2017-05-29: qty 200

## 2017-05-29 MED ORDER — CARVEDILOL 6.25 MG PO TABS
6.2500 mg | ORAL_TABLET | Freq: Two times a day (BID) | ORAL | Status: DC
  Administered 2017-05-29 – 2017-05-30 (×3): 6.25 mg via ORAL
  Filled 2017-05-29 (×3): qty 1

## 2017-05-29 MED ORDER — AMIODARONE HCL IN DEXTROSE 360-4.14 MG/200ML-% IV SOLN
60.0000 mg/h | INTRAVENOUS | Status: AC
  Administered 2017-05-29: 60 mg/h via INTRAVENOUS
  Filled 2017-05-29 (×2): qty 200

## 2017-05-29 MED ORDER — POTASSIUM CHLORIDE CRYS ER 20 MEQ PO TBCR
20.0000 meq | EXTENDED_RELEASE_TABLET | Freq: Once | ORAL | Status: AC
  Administered 2017-05-29: 20 meq via ORAL
  Filled 2017-05-29: qty 1

## 2017-05-29 MED ORDER — FUROSEMIDE 10 MG/ML IJ SOLN
40.0000 mg | Freq: Once | INTRAMUSCULAR | Status: AC
  Administered 2017-05-29: 40 mg via INTRAVENOUS
  Filled 2017-05-29: qty 4

## 2017-05-29 MED ORDER — ENOXAPARIN SODIUM 40 MG/0.4ML ~~LOC~~ SOLN
40.0000 mg | SUBCUTANEOUS | Status: DC
  Administered 2017-05-29 – 2017-06-02 (×5): 40 mg via SUBCUTANEOUS
  Filled 2017-05-29 (×5): qty 0.4

## 2017-05-29 MED ORDER — ATORVASTATIN CALCIUM 80 MG PO TABS
80.0000 mg | ORAL_TABLET | Freq: Every day | ORAL | Status: DC
  Administered 2017-05-30 – 2017-06-02 (×4): 80 mg via ORAL
  Filled 2017-05-29 (×4): qty 1

## 2017-05-29 MED ORDER — AMIODARONE LOAD VIA INFUSION
150.0000 mg | Freq: Once | INTRAVENOUS | Status: AC
Start: 2017-05-29 — End: 2017-05-29
  Administered 2017-05-29: 150 mg via INTRAVENOUS
  Filled 2017-05-29: qty 83.34

## 2017-05-29 MED ORDER — SPIRONOLACTONE 25 MG PO TABS
12.5000 mg | ORAL_TABLET | Freq: Every day | ORAL | Status: DC
  Administered 2017-05-29 – 2017-05-30 (×2): 12.5 mg via ORAL
  Filled 2017-05-29 (×2): qty 1

## 2017-05-29 MED FILL — Verapamil HCl IV Soln 2.5 MG/ML: INTRAVENOUS | Qty: 2 | Status: AC

## 2017-05-29 MED FILL — Nitroglycerin IV Soln 100 MCG/ML in D5W: INTRA_ARTERIAL | Qty: 10 | Status: AC

## 2017-05-29 NOTE — Progress Notes (Signed)
CARDIAC REHAB PHASE I   Pt in bed, dyspnea at rest, ashen color, per RN pt had VT this am when walking to the bathroom, agreed to hold ambulation at this time. Pt states he was "walking 2-3 miles per day" prior to this hospitalization. Pt very hard of hearing, states he does not read. Pt states he lives alone, states he does not have a PCP. Pt would benefit from CM/SW consults for discharge planning needs. Pt seems to have limited insight into his disease process, states "I'm just going to keep doing what I was doing but try to quit smoking." Pt c/o "feeling hot," assisted RN to reposition pt in bed. Will follow up tomorrow for ambulation, education. Pt in bed, call bell within reach. Will follow.   [place], RN, BSN 05/29/2017 1:49 PM

## 2017-05-29 NOTE — Progress Notes (Signed)
Inpatient Diabetes Program Recommendations  AACE/ADA: New Consensus Statement on Inpatient Glycemic Control (2015)  Target Ranges:  Prepandial:   less than 140 mg/dL      Peak postprandial:   less than 180 mg/dL (1-2 hours)      Critically ill patients:  140 - 180 mg/dL   Results for GREGORY, BARRICK (MRN [id number]) as of 05/29/2017 10:52  Ref. Range 05/28/2017 17:07 05/28/2017 20:42 05/28/2017 22:00 05/29/2017 07:31  Glucose-Capillary Latest Ref Range: 65 - 99 mg/dL 386 (H) 198 (H) 163 (H) 235 (H)   Review of Glycemic Control  Diabetes history: DM2 Outpatient Diabetes medications: 70/30 45 units QAM, 70/30 50 units QPM Current orders for Inpatient glycemic control: Novolog 0-15 units TID with meals, Novolog 0-5 units QHS  Inpatient Diabetes Program Recommendations: Insulin - Basal: Fasting glucose 235 mg/dl this morning. Please consider ordering either 70/30 10 units BID (if patient is eating) or Lantus 14 units Q24H.   Thanks, Barnie Alderman, RN, MSN, CDE Diabetes Coordinator Inpatient Diabetes Program [phone] (Team Pager from 8am to 5pm)

## 2017-05-29 NOTE — Progress Notes (Addendum)
Cardiology Consultation:   Patient ID: Joseph Manning; [id number]; 1951-01-01   Admit date: 05/28/2017 Date of Consult: 05/29/2017  Primary Care Provider: [name], MD Primary Cardiologist: Dr. Irish Lack Primary Electrophysiologist:  none   Patient Profile:   Joseph Manning is a 66 y.o. male with a hx of HTN, DM, COPD with ongoing smoking who is being seen today for the evaluation of VT at the request of Dr. Gwenlyn Found.  History of Present Illness:   [name] was transferred from [facility] admitted with acute inferolateral STEMI underwent emergent cath with Successful RCA PCI and drug-eluting stenting with overlapping Synergy drug-eluting stents, Dr. Gwenlyn Found iin cath note reported "residual PLA disease which will be treated medically. He has moderate LAD disease which will also be treated medically. The circumflex was intervened on however this was small and nondominant. I didn't restore antegrade flow however I do not think that this vessel requires further intervention."  He received angiomax post procedure, during his procedure he had VT, possibly PMVT in discussion with Dr. Gwenlyn Found, that required defbibrillation he was observed to have frequent PVC, bigminey as well and treated with IV amiodarone initially transitioned to PO last night just within 24 hours of IV amiodarone start with no further VT being observed and planned for transfer to telemetry, he has received 2 dose of 400mg  BID so far  This morning when ambulating back from the BR he had a self terminated episodes of VT and EP was asked to evaluate further.  The patient denies any ongoing CP, he did not have any perceived symptoms with the VT, no syncope, no palpitations, perhaps a very vague feeling like something was different reported by the patient.  LABS: K+ 4.6 BUN/Creat 30/1.36 Trop remain >65 today WBC 25.5 H/H 12/38 plts 293   Past Medical History:  Diagnosis Date  . Anxiety   . Arthritis   . COPD (chronic  obstructive pulmonary disease) ([place])   . Depression   . Diabetes mellitus without complication ([place])   . Hypertension   . Tubular adenoma of colon 12/1999    Past Surgical History:  Procedure Laterality Date  . CERVICAL DISC SURGERY    . CORONARY STENT INTERVENTION N/A 05/28/2017   Procedure: Coronary Stent Intervention;  Surgeon: Lorretta Harp, MD;  Location: [place] [facility];  Service: Cardiovascular;  Laterality: N/A;  . ESOPHAGOGASTRODUODENOSCOPY N/A 12/28/2012   Procedure: ESOPHAGOGASTRODUODENOSCOPY (EGD);  Surgeon: Milus Banister, MD;  Location: [facility];  Service: Endoscopy;  Laterality: N/A;  . JOINT REPLACEMENT Left   . KNEE ARTHROSCOPY Left   . LEFT HEART CATH AND CORONARY ANGIOGRAPHY N/A 05/28/2017   Procedure: Left Heart Cath and Coronary Angiography;  Surgeon: Lorretta Harp, MD;  Location: [place] [facility];  Service: Cardiovascular;  Laterality: N/A;     Inpatient Medications: Scheduled Meds: . amiodarone  400 mg Oral BID  . aspirin EC  81 mg Oral Daily  . atorvastatin  20 mg Oral Daily  . carvedilol  6.25 mg Oral BID WC  . heparin  4,000 Units Intravenous Once  . HYDROcodone-acetaminophen  1-2 tablet Oral TID  . insulin aspart  0-15 Units Subcutaneous TID WC  . insulin aspart  0-5 Units Subcutaneous QHS  . pantoprazole  40 mg Oral BID  . sodium chloride flush  3 mL Intravenous Q12H  . ticagrelor  90 mg Oral BID   Continuous Infusions: . sodium chloride    . sodium chloride     PRN  Meds: sodium chloride, acetaminophen, albuterol, hydrALAZINE, morphine injection, nitroGLYCERIN, ondansetron (ZOFRAN) IV, sodium chloride flush  Allergies:    Allergies  Allergen Reactions  . Fentanyl Other (See Comments)    unknown  . Versed [Midazolam] Other (See Comments)    unknown    Social History:   Social History   Social History  . Marital status: Legally Separated    Spouse name: N/A  . Number of children: 3  . Years of education: N/A    Occupational History  . Not on file.   Social History Main Topics  . Smoking status: Current Every Day Smoker    Packs/day: 1.00    Years: 50.00    Types: Cigarettes  . Smokeless tobacco: Never Used  . Alcohol use No  . Drug use: No  . Sexual activity: Not on file   Other Topics Concern  . Not on file   Social History Narrative  . No narrative on file    Family History:   The patient's family history includes Alzheimer's disease in his mother; Cancer in his father.  ROS:  Please see the history of present illness.  ROS  All other ROS reviewed and negative.     Physical Exam/Data:   Vitals:   05/29/17 0825 05/29/17 0828 05/29/17 0900 05/29/17 0939  BP:  134/76 114/70 114/70  Pulse: (!) 42 97 (!) 51 (!) 110  Resp: (!) 34 (!) 25 (!) 25   Temp:      TempSrc:      SpO2: 92% 100% 100%   Weight:      Height:        Intake/Output Summary (Last 24 hours) at 05/29/17 1010 Last data filed at 05/29/17 0900  Gross per 24 hour  Intake          1527.05 ml  Output              425 ml  Net          1102.05 ml   Filed Weights   05/28/17 0300 05/28/17 0700  Weight: 240 lb 4.8 oz (109 kg) 240 lb 4.8 oz (109 kg)   Body mass index is 36.54 kg/m.  General:  Well nourished, well developed, chronically ill appearing in no acute distress HEENT: normal Lymph: no adenopathy Neck: no JVD Endocrine:  No thryomegaly Vascular: No carotid bruits; FA pulses 2+ bilaterally without bruits  Cardiac:  normal S1, S2; RRR; no murmurs, gallops or rubs Lungs:  clear to auscultation bilaterally, no wheezing, rhonchi or rales  Abd: soft, nontender  Ext: no edema Musculoskeletal:  No deformities, BUE and BLE strength normal and equal Skin: warm and dry  Neuro:  no gross focal abnormalities noted Psych:  Normal affect   EKG:  The EKG was personally reviewed and demonstrates:  This morning, reviewed with Dr. Curt Bears: SR, 99bpm, PACs, persistent ST elevations, inf/lat Telemetry:  Telemetry  was personally reviewed and demonstrates:  SR, very frequent PACs, episodes of VT today, approx 160bpm, 10 seconds, 3 longer episodes, the longest lasting just over 1 minute duration  Relevant CV Studies:  05/28/17: LHC/PCI, Dr. Gwenlyn Found  Mid LAD lesion, 60 %stenosed.  Mid LAD to Dist LAD lesion, 50 %stenosed.  Prox Cx to Mid Cx lesion, 100 %stenosed.  Post intervention, there is a 70% residual stenosis.  Prox RCA to Dist RCA lesion, 100 %stenosed.  Post intervention, there is a 0% residual stenosis IMPRESSION: Successful RCA PCI and drug-eluting stenting in the setting of an acute  inferolateral STEMI with overlapping Synergy drug-eluting stents. The patient has residual PLA disease which will be treated medically. He has moderate LAD disease which will also be treated medically. The circumflex was intervened on however this was small and nondominant. I didn't restore antegrade flow however I do not think that this vessel requires further intervention. I do not crisis aortic valve because of his ventricular tachyarrhythmia. A 2-D echo will be obtained. I will continue Angiomax and pelvis for 4 hours and we will cycle his enzymes. He'll be treated with routine post STEMI pharmacology. The sheath will be removed and pressure held. The patient left the lab in stable condition.  05/28/17: TTE Study Conclusions - Left ventricle: The cavity size was mildly dilated. There was   mild concentric hypertrophy. Systolic function was severely   reduced. The estimated ejection fraction was in the range of 20%   to 25%. Diffuse hypokinesis. Akinesis of the inferolateral,   inferior, and basal to mid inferoseptal myocardium. Doppler   parameters are consistent with abnormal left ventricular   relaxation (grade 1 diastolic dysfunction). Doppler parameters   are consistent with indeterminate ventricular filling pressure. - Aortic valve: Transvalvular velocity was within the normal range.   There was no  stenosis. There was no regurgitation. - Mitral valve: Transvalvular velocity was within the normal range.   There was no evidence for stenosis. There was mild regurgitation. - Right ventricle: The cavity size was normal. Wall thickness was   normal. Systolic function was normal. - Tricuspid valve: There was trivial regurgitation. - Pulmonary arteries: Systolic pressure was within the normal   range. PA peak pressure: 34 mm Hg (S).  Laboratory Data:  Chemistry Recent Labs Lab 05/28/17 0030 05/28/17 0120 05/28/17 0905  NA 137  136 137 133*  K 4.2  4.2 4.2 4.6  CL 102  101 105 104  CO2 22  22  --  18*  GLUCOSE 328*  330* 303* 378*  BUN 33*  33* 32* 30*  CREATININE 1.60*  1.58* 1.20 1.36*  CALCIUM 9.3  9.2  --  8.5*  GFRNONAA 43*  44*  --  52*  GFRAA 50*  51*  --  >60  ANIONGAP 13  13  --  11     Recent Labs Lab 05/28/17 0030  PROT 8.5*  ALBUMIN 4.2  AST 217*  ALT 72*  ALKPHOS 91  BILITOT 0.8   Hematology Recent Labs Lab 05/28/17 0030 05/28/17 0120 05/28/17 0905  WBC 21.9*  --  25.5*  RBC 4.72  --  4.07*  HGB 15.0 13.9 12.9*  HCT 43.5 41.0 38.6*  MCV 92.2  --  94.8  MCH 31.8  --  31.7  MCHC 34.5  --  33.4  RDW 12.9  --  13.1  PLT 349  --  293   Cardiac Enzymes Recent Labs Lab 05/28/17 0030 05/28/17 0354 05/28/17 0905 05/28/17 1416  TROPONINI >65.00* >65.00* >65.00* >65.00*    Recent Labs Lab 05/28/17 0038  TROPIPOC >35.00*     Radiology/Studies:  Dg Chest Port 1 View Result Date: 05/28/2017 CLINICAL DATA:  Chest and back pain x2 days.  Dyspnea. EXAM: PORTABLE CHEST 1 VIEW COMPARISON:  01/31/2016 FINDINGS: AP portable semi upright view of the chest. Mild enlargement of the cardiac silhouette may be due to the portable technique. No aortic aneurysm. No pneumonic consolidation, CHF, effusion or pneumothorax. The left costophrenic angle is excluded on this study. No acute nor suspicious osseous abnormalities. There is osteoarthritic joint  space narrowing and spurring about the right AC and glenohumeral joints. IMPRESSION: Marginally enlarged cardiac silhouette likely from the AP projection. No acute pulmonary disease. Electronically Signed   By: Ashley Royalty M.D.   On: 05/28/2017 01:10    Assessment and Plan:   1. VT     S/p STEMI with intervention of some CAD/medical management of other, as noted above     No ongoing CP     The patient was seen today by Dr. Gwenlyn Found and planned for transfer out of ICU doing well           The patient has persistent EKG changes and Trop elevation, though no on-going CP VT likely ischemic driven, with some remaining disease as noted in cath report, his EF is down as well  Recommend resume IV amiodarone (bolus and load), if no further VT after 24 hours resume PO.  Continue management with Dr. Gwenlyn Found, cardiology team   Signed, Baldwin Jamaica, PA-C  05/29/2017 10:10 AM  I have seen and examined this patient with Tommye Standard.  Agree with above, note added to reflect my findings.  On exam, RRR, no murmurs, lungs clear. Patient presented with a STEMI inferior and had stenting of the RCA. Patient got out of bed and had a prolonged period of VT. Patient converted on his own. He was asymptomatic. He was recently switched to PO amiodarone after having VT while in the cath lab. Would restart amiodarone IV today and switch to PO tomorrow 400 mg BID.  He does not have an indication for an ICD currently as his VT was less than 48 hours post MI. He may benefit from life vest at discharge.  Will M. Camnitz MD 05/29/2017 4:53 PM

## 2017-05-29 NOTE — Consult Note (Signed)
Advanced Heart Failure Team Consult Note   Primary Physician: Alfredo Bach MD.  Primary Cardiologist:  Irish Lack  Reason for Consultation: Systolic CHF s/p STEMI  HPI:    Joseph Manning is seen today for evaluation of systolic HF post STEMI at the request of Dr. Gwenlyn Found.   Joseph Manning is a 66 y.o. male with history of HTN, DM2, COPD who presented to [facility] with CP on 05/28/17. EKG with inferior STE and Q waves with elevated troponin. Pt taken emergently to cath lab where he underwent successful RCA PCI and DES placement, also had PTCA small LCx. Plan to treat residual LCx and moderate LAD disease medically. Operative course complicated by VT. Placed on IV amio. Transitioned to po.   Pertinent labs on admission include Cr 1.58, K 4.2, BNP 214, Troponin >65. Hgb 12.9. WBC 25.5.   This am patient went into VT with rate of 170s while walking to bed back from bathroom. EP seeing as well for ICD.  Amio rebolused IV and po continued.  Feeling OK currently. Says he is feeling hot and his chronic back and neck pain is exacerbated by hospital bed. Denies CP currently. Breathing OK. States PTA he was SOB with mild exertion including getting dressed and showering. Chronic 2 pillow orthopnea. Smokes 1/2 pack per week at home.  Denies dizziness or lightheadedness.   Echo 05/28/17 LVEF 20-25%, Grade 1, Mild MR, Trivial TR, PA peak pressure 34 mm Hg, Normal RV.   LHC 05/28/17  Mid LAD lesion, 60 %stenosed.  Mid LAD to Dist LAD lesion, 50 %stenosed.  Prox Cx to Mid Cx lesion, 100 %stenosed.  Post intervention, there is a 70% residual stenosis.  Prox RCA to Dist RCA lesion, 100 %stenosed.  Post intervention, there is a 0% residual stenosis.  A stent was successfully placed.  Review of Systems: [y] = yes, [ ]  = no   General: Weight gain [ ] ; Weight loss [ ] ; Anorexia [ ] ; Fatigue [y]; Fever [ ] ; Chills [ ] ; Weakness [y]  Cardiac: Chest pain/pressure [y]; Resting SOB [ ] ; Exertional SOB  [y]; Orthopnea [y]; Pedal Edema [ ] ; Palpitations [ ] ; Syncope [ ] ; Presyncope [ ] ; Paroxysmal nocturnal dyspnea[ ]   Pulmonary: Cough [ ] ; Wheezing[ ] ; Hemoptysis[ ] ; Sputum [ ] ; Snoring [ ]   GI: Vomiting[ ] ; Dysphagia[ ] ; Melena[ ] ; Hematochezia [ ] ; Heartburn[ ] ; Abdominal pain [ ] ; Constipation [ ] ; Diarrhea [ ] ; BRBPR [ ]   GU: Hematuria[ ] ; Dysuria [ ] ; Nocturia[ ]   Vascular: Pain in legs with walking [ ] ; Pain in feet with lying flat [ ] ; Non-healing sores [ ] ; Stroke [ ] ; TIA [ ] ; Slurred speech [ ] ;  Neuro: Headaches[ ] ; Vertigo[ ] ; Seizures[ ] ; Paresthesias[ ] ;Blurred vision [ ] ; Diplopia [ ] ; Vision changes [ ]   Ortho/Skin: Arthritis [y]; Joint pain [y]; Muscle pain [ ] ; Joint swelling [ ] ; Back Pain [y]; Rash [ ]   Psych: Depression[ ] ; Anxiety[ ]   Heme: Bleeding problems [ ] ; Clotting disorders [ ] ; Anemia [ ]   Endocrine: Diabetes [ ] ; Thyroid dysfunction[ ]   Home Medications Prior to Admission medications   Medication Sig Start Date End Date Taking? Authorizing Provider  albuterol (PROVENTIL HFA;VENTOLIN HFA) 108 (90 BASE) MCG/ACT inhaler Inhale 2 puffs into the lungs every 6 (six) hours as needed for wheezing or shortness of breath. 05/01/15   Brand Males, MD  aspirin EC 81 MG EC tablet Take 1 tablet (81 mg total) by mouth daily. 02/02/16  Mikhail, Velta Addison, DO  atorvastatin (LIPITOR) 20 MG tablet Take 1 tablet (20 mg total) by mouth daily. Patient not taking: Reported on 03/04/2016 02/02/16   Cristal Ford, DO  clindamycin (CLEOCIN) 150 MG capsule Take 3 capsules (450 mg total) by mouth 3 (three) times daily. 11/10/16   Lawyer, Harrell Gave, PA-C  HYDROcodone-acetaminophen (NORCO/VICODIN) 5-325 MG per tablet Take 1-2 tablets by mouth 3 (three) times daily. He takes two tablets in the morning and evening and one tablet midday. 12/06/14   [provider]  hydrocortisone (ANUSOL-HC) 2.5 % rectal cream Apply rectally 2 times daily until hemorrhoid pain improves 03/04/16    Little, Wenda Overland, MD  NOVOLOG MIX 70/30 FLEXPEN (70-30) 100 UNIT/ML FlexPen Inject 35-50 Units as directed See admin instructions. Inject 45 units every morning and 50 units every evening. 01/25/16   [provider]  pantoprazole (PROTONIX) 40 MG tablet Take 1 tablet (40 mg total) by mouth 2 (two) times daily. 02/02/16   Mikhail, Velta Addison, DO  polyethylene glycol powder (GLYCOLAX/MIRALAX) powder Drink 1 capful mixed in beverage one to three times daily as needed for constipation 03/04/16   Little, Wenda Overland, MD  SPIRIVA HANDIHALER 18 MCG inhalation capsule Place 1 capsule into inhaler and inhale daily. 01/14/16   [provider]  SYMBICORT 160-4.5 MCG/ACT inhaler Inhale 1 puff into the lungs 2 (two) times daily. 01/14/16   [provider]   Past Medical History: Past Medical History:  Diagnosis Date  . Anxiety   . Arthritis   . COPD (chronic obstructive pulmonary disease) ([place])   . Depression   . Diabetes mellitus without complication ([place])   . Hypertension   . Tubular adenoma of colon 12/1999   Past Surgical History: Past Surgical History:  Procedure Laterality Date  . CERVICAL DISC SURGERY    . CORONARY STENT INTERVENTION N/A 05/28/2017   Procedure: Coronary Stent Intervention;  Surgeon: Lorretta Harp, MD;  Location: [place] [facility];  Service: Cardiovascular;  Laterality: N/A;  . ESOPHAGOGASTRODUODENOSCOPY N/A 12/28/2012   Procedure: ESOPHAGOGASTRODUODENOSCOPY (EGD);  Surgeon: Milus Banister, MD;  Location: [facility];  Service: Endoscopy;  Laterality: N/A;  . JOINT REPLACEMENT Left   . KNEE ARTHROSCOPY Left   . LEFT HEART CATH AND CORONARY ANGIOGRAPHY N/A 05/28/2017   Procedure: Left Heart Cath and Coronary Angiography;  Surgeon: Lorretta Harp, MD;  Location: [place] [facility];  Service: Cardiovascular;  Laterality: N/A;    Family History: Family History  Problem Relation Age of Onset  . Alzheimer's disease Mother   . Cancer Father         unknown type    Social History: Social History   Social History  . Marital status: Legally Separated    Spouse name: N/A  . Number of children: 3  . Years of education: N/A   Social History Main Topics  . Smoking status: Current Every Day Smoker    Packs/day: 1.00    Years: 50.00    Types: Cigarettes  . Smokeless tobacco: Never Used  . Alcohol use No  . Drug use: No  . Sexual activity: Not Asked   Other Topics Concern  . None   Social History Narrative  . None    Allergies:  Allergies  Allergen Reactions  . Fentanyl Other (See Comments)    unknown  . Versed [Midazolam] Other (See Comments)    unknown    Objective:    Vital Signs:   Temp:  [96.7 F (35.9 C)-99.2 F (37.3  C)] 96.7 F (35.9 C) (07/20 1137) Pulse Rate:  [33-110] 71 (07/20 1137) Resp:  [16-37] 30 (07/20 1137) BP: (75-148)/(44-123) 92/54 (07/20 1137) SpO2:  [63 %-100 %] 100 % (07/20 1137) Last BM Date: 05/29/17  Weight change: Filed Weights   05/28/17 0300 05/28/17 0700  Weight: 240 lb 4.8 oz (109 kg) 240 lb 4.8 oz (109 kg)    Intake/Output:   Intake/Output Summary (Last 24 hours) at 05/29/17 1147 Last data filed at 05/29/17 0900  Gross per 24 hour  Intake          1360.35 ml  Output              425 ml  Net           935.35 ml      Physical Exam    General:  Elderly appearing. Hard of hearing.  HEENT: normal Neck: supple. JVP difficult due to body habitus, Appears ~ 9-10. Carotids 2+ bilat; no bruits. No lymphadenopathy or thyromegaly appreciated. Cor: PMI nondisplaced. Regular rate & rhythm. No rubs, gallops or murmurs. Lungs: Clear, normal effort.  Abdomen: Obese, soft, nontender, nondistended. No hepatosplenomegaly. No bruits or masses. Good bowel sounds. Extremities: No cyanosis, clubbing, or rash. No edema. Slightly cool to the touch.  Neuro: alert & orientedx3, cranial nerves grossly intact. moves all 4 extremities w/o difficulty. Affect flat but appropriate.  Telemetry     Personally reviewed, bigeminy, couplets and occasional NSR. Frequent PVCs and PACs.  PVCs   Run of VT earlier this am in 170s  EKG    From this am personally reviewed. Residual STEMI changes.   Labs   Basic Metabolic Panel:  Recent Labs Lab 05/28/17 0030 05/28/17 0120 05/28/17 0905  NA 137  136 137 133*  K 4.2  4.2 4.2 4.6  CL 102  101 105 104  CO2 22  22  --  18*  GLUCOSE 328*  330* 303* 378*  BUN 33*  33* 32* 30*  CREATININE 1.60*  1.58* 1.20 1.36*  CALCIUM 9.3  9.2  --  8.5*    Liver Function Tests:  Recent Labs Lab 05/28/17 0030  AST 217*  ALT 72*  ALKPHOS 91  BILITOT 0.8  PROT 8.5*  ALBUMIN 4.2   No results for input(s): LIPASE, AMYLASE in the last 168 hours. No results for input(s): AMMONIA in the last 168 hours.  CBC:  Recent Labs Lab 05/28/17 0030 05/28/17 0120 05/28/17 0905  WBC 21.9*  --  25.5*  HGB 15.0 13.9 12.9*  HCT 43.5 41.0 38.6*  MCV 92.2  --  94.8  PLT 349  --  293    Cardiac Enzymes:  Recent Labs Lab 05/28/17 0030 05/28/17 0354 05/28/17 0905 05/28/17 1416  TROPONINI >65.00* >65.00* >65.00* >65.00*    BNP: BNP (last 3 results)  Recent Labs  05/29/17 0901  BNP 214.4*    ProBNP (last 3 results) No results for input(s): PROBNP in the last 8760 hours.   CBG:  Recent Labs Lab 05/28/17 1707 05/28/17 2042 05/28/17 2200 05/29/17 0731  GLUCAP 386* 198* 163* 235*    Coagulation Studies:  Recent Labs  05/28/17 0030  LABPROT 12.7  INR 0.95     Imaging    No results found.   Medications:     Current Medications: . amiodarone  150 mg Intravenous Once  . aspirin EC  81 mg Oral Daily  . atorvastatin  20 mg Oral Daily  . carvedilol  6.25 mg Oral BID WC  .  HYDROcodone-acetaminophen  1-2 tablet Oral TID  . insulin aspart  0-15 Units Subcutaneous TID WC  . insulin aspart  0-5 Units Subcutaneous QHS  . pantoprazole  40 mg Oral BID  . sodium chloride flush  3 mL Intravenous Q12H  . ticagrelor   90 mg Oral BID     Infusions: . sodium chloride    . sodium chloride    . amiodarone     Followed by  . amiodarone         Patient Profile   Joseph Manning is a 66 y.o. male with history of HTN, DM2, COPD who presented to [facility] with CP. Found to have post inferior STEMI. Now s/p post complex RCA PCI and stenting with attempt at circumflex PTCA.   HF team consulted 05/29/17 to help optimize medications. No overt HF noted.    Assessment/Plan   1. CAD s/p inferior STEMI with RCA PCI and DES - On brilinta 90 mg BID - No further chest pain - Continue ASA and statin.   2. Systolic CHF due to ischemic cardiomyopathy.  - Echo 05/28/17 LVEF 20-25%, Grade 1, Mild MR, Trivial TR, PA peak pressure 34 mm Hg, Normal RV.  - Volume status stable.  - Continue coreg 6.25 mg BID - With VT will likely need ICD prior to discharge.  - Add spiro 12.5 mg qhs.  - consider digoxin.  - Try and add low dose losartan in am. Will eventually be good candidate for Entresto once stable.   3. VT - Repeat episode this am.  - EP following for ICD.  - Mg pending. K stable. Continue amiodarone  4. HTN - Will adjust meds in setting of treating his left ventricular dysfunction.   Length of Stay: 1  Annamaria Helling  05/29/2017, 11:47 AM  Advanced Heart Failure Team Pager [phone] (M-F; 7a - 4p)  Please contact [facility] for night-coverage after hours (4p -7a ) and weekends on [email or web address]  Patient seen with PA, agree with the above note.  1. CAD: s/p inferior STEMI with DES to RCA and PTCA small LCx.  Has residual 70% stenosis in LCx and 50-60% stenoses in LAD.  No further chest pain.  - Continue ASA 81, Brilinta. - High dose statin.  2. Acute systolic CHF: Ischemic cardiomyopathy, EF 20-25%.  He is currently mildly short of breath at rest with JVD.  - I will give him Lasix 40 mg IV x 1 now with K replacement and follow response.  - Continue Coreg 6.25 mg bid.  - Add spironolactone.   - If BP and creatinine stable, add on losartan in the am.  3. Elevated creatinine: Creatinine mildly higher than 6 months ago.  Follow closely with med titration.  4. VT: During PCI, had VT requiring defibrillation.  This morning, had a run of sustained VT that self-terminated (around 24 hours after PCI). No chest pain or other evidence for ongoing ischemia.  - VT episode still within 48 hrs of his event, thus no clear indication for ICD yet.  Continue to observe.  Will definitely need Lifevest and will keep on amiodarone.  If he has recurrent VT 48+ hours after event will need ICD implanted this admission.   Loralie Champagne 05/29/2017 2:21 PM

## 2017-05-29 NOTE — Progress Notes (Signed)
Progress Note  Patient Name: Joseph Manning Date of Encounter: 05/29/2017  Primary Cardiologist: Irish Lack   Subjective   Feeling better this morning. No significant chest pain. Postop day #2 acute anterior STEMI she was PCI and drug-eluting stenting of the dominant RCA. I did attempt to open up the circumflex however this appeared to be a chronic total occlusion and after predilatation I demonstrated small distal marginal branches. He has been hemodynamically stable. He's had frequent PVCs and bigeminy but no more ventricular tachycardia. His amiodarone IV has been stopped and he spent transition to a by mouth load. He denies chest pain or shortness of breath.  Inpatient Medications    Scheduled Meds: . amiodarone  400 mg Oral BID  . aspirin EC  81 mg Oral Daily  . atorvastatin  20 mg Oral Daily  . heparin  4,000 Units Intravenous Once  . HYDROcodone-acetaminophen  1-2 tablet Oral TID  . insulin aspart  0-15 Units Subcutaneous TID WC  . insulin aspart  0-5 Units Subcutaneous QHS  . metoprolol tartrate  12.5 mg Oral BID  . pantoprazole  40 mg Oral BID  . sodium chloride flush  3 mL Intravenous Q12H  . ticagrelor  90 mg Oral BID   Continuous Infusions: . sodium chloride    . sodium chloride     PRN Meds: sodium chloride, acetaminophen, albuterol, hydrALAZINE, morphine injection, nitroGLYCERIN, ondansetron (ZOFRAN) IV, sodium chloride flush   Vital Signs    Vitals:   05/29/17 0500 05/29/17 0600 05/29/17 0700 05/29/17 0723  BP: 133/82 (!) 136/123 122/73 122/73  Pulse: (!) 43 (!) 55 (!) 33 (!) 49  Resp: (!) 27 (!) 30 (!) 30 (!) 27  Temp:    98 F (36.7 C)  TempSrc:    Oral  SpO2: 98% 100% 100% 100%  Weight:      Height:        Intake/Output Summary (Last 24 hours) at 05/29/17 0756 Last data filed at 05/28/17 2200  Gross per 24 hour  Intake          2180.35 ml  Output             1075 ml  Net          1105.35 ml   Filed Weights   05/28/17 0300 05/28/17 0700    Weight: 240 lb 4.8 oz (109 kg) 240 lb 4.8 oz (109 kg)    Telemetry    Sinus rhythm with frequent PVCs and ventricular bigeminy. - Personally Reviewed  ECG    Normal sinus rhythm with persistent inferior ST segment elevation - Personally Reviewed  Physical Exam   GEN: No acute distress.   Neck: No JVD Cardiac: RRR, no murmurs, rubs, or gallops.  Respiratory: Clear to auscultation bilaterally. GI: Soft, nontender, non-distended  MS: No edema; No deformity. Neuro:  Nonfocal  Psych: Normal affect  Extremities-Right common femoral puncture site well-healed   Labs    Chemistry  Recent Labs Lab 05/28/17 0030 05/28/17 0120 05/28/17 0905  NA 137  136 137 133*  K 4.2  4.2 4.2 4.6  CL 102  101 105 104  CO2 22  22  --  18*  GLUCOSE 328*  330* 303* 378*  BUN 33*  33* 32* 30*  CREATININE 1.60*  1.58* 1.20 1.36*  CALCIUM 9.3  9.2  --  8.5*  PROT 8.5*  --   --   ALBUMIN 4.2  --   --   AST 217*  --   --  ALT 72*  --   --   ALKPHOS 91  --   --   BILITOT 0.8  --   --   GFRNONAA 43*  44*  --  52*  GFRAA 50*  51*  --  >60  ANIONGAP 13  13  --  11     Hematology  Recent Labs Lab 05/28/17 0030 05/28/17 0120 05/28/17 0905  WBC 21.9*  --  25.5*  RBC 4.72  --  4.07*  HGB 15.0 13.9 12.9*  HCT 43.5 41.0 38.6*  MCV 92.2  --  94.8  MCH 31.8  --  31.7  MCHC 34.5  --  33.4  RDW 12.9  --  13.1  PLT 349  --  293    Cardiac Enzymes  Recent Labs Lab 05/28/17 0030 05/28/17 0354 05/28/17 0905 05/28/17 1416  TROPONINI >65.00* >65.00* >65.00* >65.00*     Recent Labs Lab 05/28/17 0038  TROPIPOC >35.00*     BNPNo results for input(s): BNP, PROBNP in the last 168 hours.   DDimer No results for input(s): DDIMER in the last 168 hours.   Radiology    Dg Chest Port 1 View  Result Date: 05/28/2017 CLINICAL DATA:  Chest and back pain x2 days.  Dyspnea. EXAM: PORTABLE CHEST 1 VIEW COMPARISON:  01/31/2016 FINDINGS: AP portable semi upright view of the chest.  Mild enlargement of the cardiac silhouette may be due to the portable technique. No aortic aneurysm. No pneumonic consolidation, CHF, effusion or pneumothorax. The left costophrenic angle is excluded on this study. No acute nor suspicious osseous abnormalities. There is osteoarthritic joint space narrowing and spurring about the right AC and glenohumeral joints. IMPRESSION: Marginally enlarged cardiac silhouette likely from the AP projection. No acute pulmonary disease. Electronically Signed   By: Ashley Royalty M.D.   On: 05/28/2017 01:10    Cardiac Studies   Cardiac catheterization/PCI and stent-(05/28/17)  Conclusion     Mid LAD lesion, 60 %stenosed.  Mid LAD to Dist LAD lesion, 50 %stenosed.  Prox Cx to Mid Cx lesion, 100 %stenosed.  Post intervention, there is a 70% residual stenosis.  Prox RCA to Dist RCA lesion, 100 %stenosed.  Post intervention, there is a 0% residual stenosis.  A stent was successfully placed.   2-D echocardiogram (05/28/17) Study Conclusions  - Left ventricle: The cavity size was mildly dilated. There was   mild concentric hypertrophy. Systolic function was severely   reduced. The estimated ejection fraction was in the range of 20%   to 25%. Diffuse hypokinesis. Akinesis of the inferolateral,   inferior, and basal to mid inferoseptal myocardium. Doppler   parameters are consistent with abnormal left ventricular   relaxation (grade 1 diastolic dysfunction). Doppler parameters   are consistent with indeterminate ventricular filling pressure. - Aortic valve: Transvalvular velocity was within the normal range.   There was no stenosis. There was no regurgitation. - Mitral valve: Transvalvular velocity was within the normal range.   There was no evidence for stenosis. There was mild regurgitation. - Right ventricle: The cavity size was normal. Wall thickness was   normal. Systolic function was normal. - Tricuspid valve: There was trivial regurgitation. -  Pulmonary arteries: Systolic pressure was within the normal   range. PA peak pressure: 34 mm Hg (S).    Patient Profile     66 y.o. male status post inferior STEMI beginning 2 days ago status post complex RCA PCI and stenting with attempt at circumflex PTCA. He has remained hemodynamically  stable. He did have ventricular tachycardia during the procedure continues to have frequent PVCs and ventricular bigeminy. He was on IV amiodarone which has been transitioned to an oral load. A 2-D echo revealed severe LV dysfunction with an EF in the 20-25% range.  Assessment & Plan    1: Inferior STEMI-postop day 0 inferolateral STEMI treated with PCI stenting of a large dominant RCA with attempt at PTCA of an occluded circumflex. Patient has moderate and not critical LAD disease. A 2-D echo Revealed an ejection fraction of 20-25%. His troponins are greater than 65. This is removed and his groin is stable. He has had no further chest pain. He remains on dual antiplatelet therapy..  2: Essential hypertension-not on antihypertensive medications as an outpatient. Blood pressure since his hospitalization has remained stable. He will end up being on low-dose beta blocker. In all likelihood, because of his moderate renal insufficiency, he probably will not be on an ACE inhibitor.  3: Ventricular tachycardia-the patient has had no further ventricular tachycardia. He continues to have frequent PVCs and ventricular bigeminy. He wasn't IV amiodarone which was discontinued and transitioned to an oral load. The patient is ultimately to have him on 200 mg a day and probably discontinued sometime in the next several months.  4: Ischemic cardiomyopathy-ejection fraction 20-25%. I have asked the heart failure service to participate in his care. He currently is not in heart failure.  Postop day 2 large inferior STEMI. He remained hemodynamically stable. I'm going to transfer him to telemetry and have him ambulate with  cardiac rehabilitation. He'll most likely be ready for discharge in the next 48 hours.   Angelina Sheriff, MD  05/29/2017, 7:56 AM

## 2017-05-29 NOTE — Progress Notes (Signed)
EKG CRITICAL VALUE     12 lead EKG performed.  Critical value noted.  Molli Knock RN notified.   Ja Pistole H, CCT 05/29/2017 7:13 AM

## 2017-05-29 NOTE — Progress Notes (Signed)
Pt ambulated to bathroom, upon completion and ambulation back to bed, pt went into VT rate 170s. Asymptomatic. Pt converted on his own. EKG obtained. MD paged. MD wants to cancel transfer to tele.

## 2017-05-30 LAB — BASIC METABOLIC PANEL
ANION GAP: 13 (ref 5–15)
BUN: 55 mg/dL — ABNORMAL HIGH (ref 6–20)
CALCIUM: 8.8 mg/dL — AB (ref 8.9–10.3)
CO2: 21 mmol/L — ABNORMAL LOW (ref 22–32)
Chloride: 100 mmol/L — ABNORMAL LOW (ref 101–111)
Creatinine, Ser: 1.64 mg/dL — ABNORMAL HIGH (ref 0.61–1.24)
GFR, EST AFRICAN AMERICAN: 48 mL/min — AB (ref >60)
GFR, EST NON AFRICAN AMERICAN: 42 mL/min — AB (ref >60)
GLUCOSE: 165 mg/dL — AB (ref 65–99)
Potassium: 5 mmol/L (ref 3.5–5.1)
SODIUM: 134 mmol/L — AB (ref 135–145)

## 2017-05-30 LAB — GLUCOSE, CAPILLARY
GLUCOSE-CAPILLARY: 176 mg/dL — AB (ref 65–99)
GLUCOSE-CAPILLARY: 181 mg/dL — AB (ref 65–99)
Glucose-Capillary: 172 mg/dL — ABNORMAL HIGH (ref 65–99)
Glucose-Capillary: 186 mg/dL — ABNORMAL HIGH (ref 65–99)

## 2017-05-30 LAB — MAGNESIUM: MAGNESIUM: 2.4 mg/dL (ref 1.7–2.4)

## 2017-05-30 MED ORDER — AMIODARONE HCL 200 MG PO TABS
400.0000 mg | ORAL_TABLET | Freq: Two times a day (BID) | ORAL | Status: DC
  Administered 2017-05-30 – 2017-05-31 (×3): 400 mg via ORAL
  Filled 2017-05-30 (×3): qty 2

## 2017-05-30 MED ORDER — FLUTICASONE FUROATE-VILANTEROL 200-25 MCG/INH IN AEPB
1.0000 | INHALATION_SPRAY | Freq: Every day | RESPIRATORY_TRACT | Status: DC
  Administered 2017-05-30 – 2017-06-02 (×3): 1 via RESPIRATORY_TRACT
  Filled 2017-05-30 (×2): qty 28

## 2017-05-30 MED ORDER — BISOPROLOL FUMARATE 5 MG PO TABS
2.5000 mg | ORAL_TABLET | Freq: Every day | ORAL | Status: DC
  Administered 2017-05-31 – 2017-06-01 (×2): 2.5 mg via ORAL
  Filled 2017-05-30 (×2): qty 1

## 2017-05-30 MED ORDER — TIOTROPIUM BROMIDE MONOHYDRATE 18 MCG IN CAPS
18.0000 ug | ORAL_CAPSULE | Freq: Every day | RESPIRATORY_TRACT | Status: DC
  Administered 2017-06-01 – 2017-06-02 (×2): 18 ug via RESPIRATORY_TRACT
  Filled 2017-05-30 (×2): qty 5

## 2017-05-30 NOTE — Progress Notes (Signed)
CRITICAL VALUE ALERT  Critical Value:  EKG reads Acute MI. Pt asymptomatic.  Date & Time Notied: 05/30/2017  08:43  Provider Notified: Loralie Champagne, MD  Orders Received/Actions taken: No new orders at this time. Will continue to monitor closely.   Ronald Pippins, RN

## 2017-05-30 NOTE — Progress Notes (Addendum)
CARDIAC REHAB PHASE I   PRE:  Rate/Rhythm: 80 SR  BP:  Supine: 106/58 Sitting:   Standing:    SaO2: 100% 3LO2  MODE:  Ambulation: 20 ft   POST:  Rate/Rhythm: 94 w/ PVCs  BP:  Supine:   Sitting: 126/66  Standing:    SaO2: 95% 3L O2  [phone] Patient ambulated out of room and back with assistance x2 and pushing wheel chair.Pt c/o SOB unable to ambulate a great distance. Assisted to chair, but patient wanted to return to bed, so assisted to bed, VSS. Attempted MI education including heart healthy diet, activity progression, CP and calling 911 and tobacco cessation. Pt very HOH and needs reinforcement regarding risk factor modification. Pt states he can't read, so handouts not given. Discussed Phase 2 cardiac rehab, and pt is interested in the program at [facility]. Will send referral.  Sol Passer, MS, ACSM CEP

## 2017-05-30 NOTE — Progress Notes (Signed)
Patient ID: Joseph Manning, male   DOB: 1951/05/18, 66 y.o.   MRN: [id number]     Advanced Heart Failure Rounding Note  Primary HF Cardiologist: Aundra Dubin  Subjective:    No chest pain.  He does have some dyspnea walking out in the hall.  He is not on his home inhalers.  He got Lasix 40 mg IV x 1 today.  Creatinine up to 1.6 today with elevated BUN and K.   Objective:   Weight Range: 240 lb 4.8 oz (109 kg) Body mass index is 36.54 kg/m.   Vital Signs:   Temp:  [96.7 F (35.9 C)-98.7 F (37.1 C)] 98.6 F (37 C) (07/21 0728) Pulse Rate:  [62-92] 76 (07/21 1000) Resp:  [14-30] 17 (07/21 1000) BP: (91-137)/(47-87) 95/70 (07/21 1000) SpO2:  [95 %-100 %] 100 % (07/21 1000) Last BM Date: 05/29/17  Weight change: Filed Weights   05/28/17 0300 05/28/17 0700  Weight: 240 lb 4.8 oz (109 kg) 240 lb 4.8 oz (109 kg)    Intake/Output:   Intake/Output Summary (Last 24 hours) at 05/30/17 1002 Last data filed at 05/30/17 0800  Gross per 24 hour  Intake          2753.63 ml  Output             1075 ml  Net          1678.63 ml      Physical Exam    General:  Obese. No resp difficulty HEENT: Normal Neck: Thick, JVP difficult.  No lymphadenopathy or thyromegaly appreciated. Cor: PMI nondisplaced. Regular rate & rhythm. No rubs, gallops or murmurs. Lungs: Distant breath sounds with rhonchi.  Abdomen: Soft, nontender, nondistended. No hepatosplenomegaly. No bruits or masses. Good bowel sounds. Extremities: No cyanosis, clubbing, rash, edema Neuro: Alert & orientedx3, cranial nerves grossly intact. moves all 4 extremities w/o difficulty. Affect pleasant   Telemetry   Personally reviewed, NSR with PACs  EKG    NSR, persistent inferior ST elevation and V1/V2 ST depression.   Labs    CBC  Recent Labs  05/28/17 0030 05/28/17 0120 05/28/17 0905  WBC 21.9*  --  25.5*  HGB 15.0 13.9 12.9*  HCT 43.5 41.0 38.6*  MCV 92.2  --  94.8  PLT 349  --  591   Basic Metabolic  Panel  Recent Labs  05/28/17 0905 05/29/17 1233 05/30/17 0338  NA 133*  --  134*  K 4.6  --  5.0  CL 104  --  100*  CO2 18*  --  21*  GLUCOSE 378*  --  165*  BUN 30*  --  55*  CREATININE 1.36*  --  1.64*  CALCIUM 8.5*  --  8.8*  MG  --  2.2 2.4   Liver Function Tests  Recent Labs  05/28/17 0030  AST 217*  ALT 72*  ALKPHOS 91  BILITOT 0.8  PROT 8.5*  ALBUMIN 4.2   No results for input(s): LIPASE, AMYLASE in the last 72 hours. Cardiac Enzymes  Recent Labs  05/28/17 0354 05/28/17 0905 05/28/17 1416  TROPONINI >65.00* >65.00* >65.00*    BNP: BNP (last 3 results)  Recent Labs  05/29/17 0901  BNP 214.4*    ProBNP (last 3 results) No results for input(s): PROBNP in the last 8760 hours.   D-Dimer No results for input(s): DDIMER in the last 72 hours. Hemoglobin A1C No results for input(s): HGBA1C in the last 72 hours. Fasting Lipid Panel  Recent Labs  05/28/17  0030  CHOL 245*  HDL 51  LDLCALC 165*  TRIG 146  CHOLHDL 4.8   Thyroid Function Tests No results for input(s): TSH, T4TOTAL, T3FREE, THYROIDAB in the last 72 hours.  Invalid input(s): FREET3  Other results:   Imaging     No results found.   Medications:     Scheduled Medications: . aspirin EC  81 mg Oral Daily  . atorvastatin  80 mg Oral Daily  . carvedilol  6.25 mg Oral BID WC  . enoxaparin (LOVENOX) injection  40 mg Subcutaneous Q24H  . HYDROcodone-acetaminophen  1-2 tablet Oral TID  . insulin aspart  0-15 Units Subcutaneous TID WC  . insulin aspart  0-5 Units Subcutaneous QHS  . levalbuterol  0.63 mg Nebulization TID  . pantoprazole  40 mg Oral BID  . sodium chloride flush  3 mL Intravenous Q12H  . ticagrelor  90 mg Oral BID  . tiotropium  18 mcg Inhalation Daily     Infusions: . sodium chloride    . sodium chloride    . amiodarone 30 mg/hr (05/30/17 0549)     PRN Medications:  sodium chloride, acetaminophen, albuterol, hydrALAZINE, morphine injection,  nitroGLYCERIN, ondansetron (ZOFRAN) IV, sodium chloride flush    Patient Profile   Joseph Manning is a 66 y.o. male with history of HTN, DM2, COPD who presented to [facility] with CP. Found to have postero- inferior STEMI. Now s/p post complex RCA PCI and stenting with PTCA LCx.   Assessment/Plan   1. CAD: s/p inferior STEMI with DES to RCA and PTCA small LCx.  Has residual 70% stenosis in LCx and 50-60% stenoses in LAD.  No further chest pain but he has had persistent ST elevation in the inferior leads and ST depression in V1/V2. This is concerning but no indication at this point to return to cath lab.  - Continue ASA 81, Brilinta. - High dose statin.  2. Acute systolic CHF: Ischemic cardiomyopathy, EF 20-25%.  He got Lasix x 1 yesterday with dyspnea and ?volume overload, creatinine up to 1.6.  Volume status difficult on exam but probably not markedly volume overloaded today.   - No further Lasix and will hold spironolactone for now with rise in creatinine and K.  - No ARB/ACEI/ARNI yet with rise in creatinine and K.  - Given significant COPD, will transition from Coreg to bisoprolol (more beta-1 selective agent).  3. AKI: May be related to Lasix yesterday.  No further diuretic for now.   4. VT: During PCI, had VT requiring defibrillation.  On 7/20, had a run of sustained VT that self-terminated (around 24 hours after PCI). No chest pain or other evidence for ongoing ischemia. No further VT overnight, had some PVCs.  - VT episode 7/21 was still within 48 hrs of his event, thus no clear indication for ICD yet.  Continue to observe.  Will definitely need Lifevest and will keep on amiodarone (transition to po today).  If he has recurrent VT 48+ hours after event will need ICD implanted this admission. 5. COPD: Patient is short of breath walking in the hall.  Reviewing chart, he has significant COPD.  I will restart Spiriva and Symbicort.  Think this is more likely the etiology of his dyspnea than CHF.     May transfer to telemetry. With episode of VT post-procedure, dyspnea, and rise in creatinine as well as persistent ST elevation on ECG, will probably keep him until Monday.  Continue to work with cardiac rehab.  Length of Stay: 2  Loralie Champagne, MD  05/30/2017, 10:02 AM  Advanced Heart Failure Team Pager [phone] (M-F; 7a - 4p)  Please contact [facility] for night-coverage after hours (4p -7a ) and weekends on [email or web address]

## 2017-05-30 NOTE — Progress Notes (Signed)
Report given to Tina, RN.  

## 2017-05-31 LAB — BASIC METABOLIC PANEL
Anion gap: 11 (ref 5–15)
BUN: 43 mg/dL — AB (ref 6–20)
CALCIUM: 8.9 mg/dL (ref 8.9–10.3)
CHLORIDE: 100 mmol/L — AB (ref 101–111)
CO2: 26 mmol/L (ref 22–32)
CREATININE: 1.5 mg/dL — AB (ref 0.61–1.24)
GFR calc non Af Amer: 46 mL/min — ABNORMAL LOW (ref >60)
GFR, EST AFRICAN AMERICAN: 54 mL/min — AB (ref >60)
Glucose, Bld: 210 mg/dL — ABNORMAL HIGH (ref 65–99)
Potassium: 4.6 mmol/L (ref 3.5–5.1)
SODIUM: 137 mmol/L (ref 135–145)

## 2017-05-31 LAB — CBC
HCT: 36.6 % — ABNORMAL LOW (ref 39.0–52.0)
Hemoglobin: 11.8 g/dL — ABNORMAL LOW (ref 13.0–17.0)
MCH: 30.6 pg (ref 26.0–34.0)
MCHC: 32.2 g/dL (ref 30.0–36.0)
MCV: 95.1 fL (ref 78.0–100.0)
PLATELETS: 423 10*3/uL — AB (ref 150–400)
RBC: 3.85 MIL/uL — ABNORMAL LOW (ref 4.22–5.81)
RDW: 13.1 % (ref 11.5–15.5)
WBC: 12.6 10*3/uL — AB (ref 4.0–10.5)

## 2017-05-31 LAB — GLUCOSE, CAPILLARY
GLUCOSE-CAPILLARY: 234 mg/dL — AB (ref 65–99)
GLUCOSE-CAPILLARY: 172 mg/dL — AB (ref 65–99)
Glucose-Capillary: 226 mg/dL — ABNORMAL HIGH (ref 65–99)
Glucose-Capillary: 219 mg/dL — ABNORMAL HIGH (ref 65–99)

## 2017-05-31 MED ORDER — ALBUTEROL SULFATE (2.5 MG/3ML) 0.083% IN NEBU
3.0000 mL | INHALATION_SOLUTION | RESPIRATORY_TRACT | Status: DC | PRN
  Administered 2017-06-02: 3 mL via RESPIRATORY_TRACT
  Filled 2017-05-31: qty 3

## 2017-05-31 MED ORDER — ALUM & MAG HYDROXIDE-SIMETH 200-200-20 MG/5ML PO SUSP
30.0000 mL | Freq: Four times a day (QID) | ORAL | Status: DC | PRN
  Administered 2017-05-31: 30 mL via ORAL
  Filled 2017-05-31: qty 30

## 2017-05-31 MED ORDER — AMIODARONE HCL 200 MG PO TABS
200.0000 mg | ORAL_TABLET | Freq: Two times a day (BID) | ORAL | Status: DC
  Administered 2017-05-31 – 2017-06-02 (×4): 200 mg via ORAL
  Filled 2017-05-31 (×4): qty 1

## 2017-05-31 MED ORDER — SPIRONOLACTONE 25 MG PO TABS
12.5000 mg | ORAL_TABLET | Freq: Every day | ORAL | Status: DC
  Administered 2017-05-31 – 2017-06-01 (×2): 12.5 mg via ORAL
  Filled 2017-05-31 (×2): qty 1

## 2017-05-31 NOTE — Progress Notes (Signed)
Patient ID: Joseph Manning, male   DOB: 07-Dec-1950, 66 y.o.   MRN: [id number]     Advanced Heart Failure Rounding Note  Primary HF Cardiologist: Aundra Dubin  Subjective:    No chest pain.  He has some pain in his knee and his neck.  No VT.  Breathing better and walked out in hall now that he is taking his home inhalers again.   Objective:   Weight Range: 233 lb 9.6 oz (106 kg) Body mass index is 35.52 kg/m.   Vital Signs:   Temp:  [97.4 F (36.3 C)-97.7 F (36.5 C)] 97.6 F (36.4 C) (07/22 0431) Pulse Rate:  [60-77] 74 (07/22 0431) Resp:  [16-23] 23 (07/22 0431) BP: (86-155)/(49-128) 117/60 (07/22 0431) SpO2:  [92 %-100 %] 100 % (07/22 0850) Weight:  [233 lb 9.6 oz (106 kg)-234 lb 6.4 oz (106.3 kg)] 233 lb 9.6 oz (106 kg) (07/22 0431) Last BM Date: 05/29/17  Weight change: Filed Weights   05/28/17 0700 05/30/17 1456 05/31/17 0431  Weight: 240 lb 4.8 oz (109 kg) 234 lb 6.4 oz (106.3 kg) 233 lb 9.6 oz (106 kg)    Intake/Output:   Intake/Output Summary (Last 24 hours) at 05/31/17 1026 Last data filed at 05/31/17 0903  Gross per 24 hour  Intake           1303.7 ml  Output              900 ml  Net            403.7 ml      Physical Exam    General:  Obese. No resp difficulty HEENT: Normal Neck: Thick, JVP difficult.  No lymphadenopathy or thyromegaly appreciated. Cor: PMI nondisplaced. Regular rate & rhythm. No rubs, gallops or murmurs. Lungs: Distant breath sounds w/o wheezing.  Abdomen: Soft, nontender, nondistended. No hepatosplenomegaly. No bruits or masses. Good bowel sounds. Extremities: No cyanosis, clubbing, rash, edema Neuro: Alert & orientedx3, cranial nerves grossly intact. moves all 4 extremities w/o difficulty. Affect pleasant  Telemetry   Personally reviewed, NSR  EKG    7/21 NSR, persistent inferior ST elevation and V1/V2 ST depression.   Labs    CBC  Recent Labs  05/31/17 0541  WBC 12.6*  HGB 11.8*  HCT 36.6*  MCV 95.1  PLT 423*   Basic  Metabolic Panel  Recent Labs  05/29/17 1233 05/30/17 0338 05/31/17 0541  NA  --  134* 137  K  --  5.0 4.6  CL  --  100* 100*  CO2  --  21* 26  GLUCOSE  --  165* 210*  BUN  --  55* 43*  CREATININE  --  1.64* 1.50*  CALCIUM  --  8.8* 8.9  MG 2.2 2.4  --    Liver Function Tests No results for input(s): AST, ALT, ALKPHOS, BILITOT, PROT, ALBUMIN in the last 72 hours. No results for input(s): LIPASE, AMYLASE in the last 72 hours. Cardiac Enzymes  Recent Labs  05/28/17 1416  TROPONINI >65.00*    BNP: BNP (last 3 results)  Recent Labs  05/29/17 0901  BNP 214.4*    ProBNP (last 3 results) No results for input(s): PROBNP in the last 8760 hours.   D-Dimer No results for input(s): DDIMER in the last 72 hours. Hemoglobin A1C No results for input(s): HGBA1C in the last 72 hours. Fasting Lipid Panel No results for input(s): CHOL, HDL, LDLCALC, TRIG, CHOLHDL, LDLDIRECT in the last 72 hours. Thyroid Function Tests No results  for input(s): TSH, T4TOTAL, T3FREE, THYROIDAB in the last 72 hours.  Invalid input(s): FREET3  Other results:   Imaging    No results found.   Medications:     Scheduled Medications: . amiodarone  400 mg Oral BID  . aspirin EC  81 mg Oral Daily  . atorvastatin  80 mg Oral Daily  . bisoprolol  2.5 mg Oral Daily  . enoxaparin (LOVENOX) injection  40 mg Subcutaneous Q24H  . fluticasone furoate-vilanterol  1 puff Inhalation Daily  . HYDROcodone-acetaminophen  1-2 tablet Oral TID  . insulin aspart  0-15 Units Subcutaneous TID WC  . insulin aspart  0-5 Units Subcutaneous QHS  . levalbuterol  0.63 mg Nebulization TID  . pantoprazole  40 mg Oral BID  . sodium chloride flush  3 mL Intravenous Q12H  . spironolactone  12.5 mg Oral Daily  . ticagrelor  90 mg Oral BID  . tiotropium  18 mcg Inhalation Daily    Infusions: . sodium chloride    . sodium chloride      PRN Medications: sodium chloride, acetaminophen, albuterol, hydrALAZINE,  morphine injection, nitroGLYCERIN, ondansetron (ZOFRAN) IV, sodium chloride flush    Patient Profile   Joseph Manning is a 66 y.o. male with history of HTN, DM2, COPD who presented to [facility] with CP. Found to have postero- inferior STEMI. Now s/p post complex RCA PCI and stenting with PTCA LCx.   Assessment/Plan   1. CAD: s/p inferior STEMI with DES to RCA and PTCA small LCx.  Has residual 70% stenosis in LCx and 50-60% stenoses in LAD.  No further chest pain but he has had persistent ST elevation in the inferior leads and ST depression in V1/V2. This is concerning but no indication at this point to return to cath lab.  - Continue ASA 81, Brilinta. - High dose statin.  2. Acute systolic CHF: Ischemic cardiomyopathy, EF 20-25%.  He got Lasix x 1 7/20 with dyspnea and ?volume overload with rise in creatinine.  No further Lasix, creatinine 1.5 today.  Volume status difficult on exam but probably not markedly volume overloaded.   - No further Lasix. - Will add spironolactone 12.5 daily.  - No ARB/ACEI/ARNI yet soft BP at times and elevated K.   - Given significant COPD, he was transitioned from Coreg to bisoprolol (more beta-1 selective agent).  3. AKI: May be related to Lasix on 7/20.  No further diuretic for now.  Creatinine better today.  4. VT: During PCI, had VT requiring defibrillation.  On 7/20, had a run of sustained VT that self-terminated (around 24 hours after PCI). No chest pain or other evidence for ongoing ischemia. No further VT.  - VT episode 7/20 was still within 48 hrs of his event, thus no clear indication for ICD yet.  Continue to observe.  Will definitely need Lifevest (arrange tomorrow) and will keep on amiodarone for now (transitioned to po).  Given significant COPD, will need to come off amiodarone in the near future.  If he has recurrent VT 48+ hours after event will need ICD implanted this admission. 5. COPD: Patient is short of breath walking in the hall.  Reviewing chart,  he has significant COPD.  I restarted Spiriva and Symbicort.  Think this is more likely the etiology of his dyspnea than CHF.   If he remains stable overnight, plan to discharge tomorrow.  Will need Lifevest.    Length of Stay: 3  Loralie Champagne, MD  05/31/2017, 10:26 AM  Advanced Heart Failure Team Pager [phone] (M-F; [place])  Please contact [facility] for night-coverage after hours (4p -7a ) and weekends on [email or web address]

## 2017-06-01 LAB — GLUCOSE, CAPILLARY
GLUCOSE-CAPILLARY: 170 mg/dL — AB (ref 65–99)
GLUCOSE-CAPILLARY: 177 mg/dL — AB (ref 65–99)
Glucose-Capillary: 97 mg/dL (ref 65–99)
Glucose-Capillary: 225 mg/dL — ABNORMAL HIGH (ref 65–99)
Glucose-Capillary: 227 mg/dL — ABNORMAL HIGH (ref 65–99)

## 2017-06-01 LAB — BASIC METABOLIC PANEL
Anion gap: 11 (ref 5–15)
BUN: 36 mg/dL — ABNORMAL HIGH (ref 6–20)
CALCIUM: 8.5 mg/dL — AB (ref 8.9–10.3)
CHLORIDE: 98 mmol/L — AB (ref 101–111)
CO2: 23 mmol/L (ref 22–32)
CREATININE: 1.28 mg/dL — AB (ref 0.61–1.24)
GFR calc Af Amer: >60 mL/min (ref >60)
GFR calc non Af Amer: 56 mL/min — ABNORMAL LOW (ref >60)
GLUCOSE: 177 mg/dL — AB (ref 65–99)
Potassium: 4.1 mmol/L (ref 3.5–5.1)
Sodium: 132 mmol/L — ABNORMAL LOW (ref 135–145)

## 2017-06-01 MED ORDER — SPIRONOLACTONE 25 MG PO TABS: 12.5000 mg | ORAL_TABLET | Freq: Every day | ORAL | Status: DC

## 2017-06-01 MED ORDER — BISOPROLOL FUMARATE 5 MG PO TABS: 2.5000 mg | ORAL_TABLET | Freq: Every day | ORAL | Status: DC

## 2017-06-01 MED ORDER — SODIUM CHLORIDE 0.9 % IV BOLUS (SEPSIS)
500.0000 mL | Freq: Once | INTRAVENOUS | Status: AC
  Administered 2017-06-01: 500 mL via INTRAVENOUS

## 2017-06-01 NOTE — Progress Notes (Signed)
Pt assisted back to bed by academy RN after sitting in chair for short time, SOB with exertion, c/o dizzyness, being hot and very tired, PA notified,no new orders at present.  Edward Qualia RN

## 2017-06-01 NOTE — Progress Notes (Signed)
Social work to please call Janan Halter at [phone] (concerning Life Vest) once address is established for [name]. Joseph Manning 06/01/2017 6:42 PM

## 2017-06-01 NOTE — Clinical Social Work Note (Signed)
Clinical Social Work Assessment  Patient Details  Name: Joseph Manning MRN: [id number] Date of Birth: Dec 30, 1950  Date of referral:  06/01/17               Reason for consult:  Housing Concerns/Homelessness                Permission sought to share information with:    Permission granted to share information::     Name::        Agency::     Relationship::     Contact Information:     Housing/Transportation Living arrangements for the past 2 months:  Homeless Source of Information:  Patient Patient Interpreter Needed:  None Criminal Activity/Legal Involvement Pertinent to Current Situation/Hospitalization:  No - Comment as needed Significant Relationships:  Adult Children Lives with:  Adult Children Do you feel safe going back to the place where you live?  No Need for family participation in patient care:  No (Coment)  Care giving concerns: Patient was renting a single family home with his daughter and the landlord sold the house about a month ago. Patient and daughter have been living in a tent at a campground since then. Patient requires LifeVest for heart condition. No family at bedside.   Social Worker assessment / plan: CSW met with patient at bedside. Patient is hard of hearing. Patient reported he and his daughter have been looking for housing and have not been able to find anything in patient's price range; patient receives social security and disability income. Patient reports he has no other family or friends to stay with. Medical team recommends LifeVest for patient, which would require electricity to charge batteries. CSW assessed patient for [facility] program and completed referral form. CSW will also provide shelter and permanent housig resources to patient as back-up. CSW will continue to follow and support with disposition planning. CSW to follow up with patient's daughter.  Employment status:  Retired Forensic scientist:  Information systems manager, Medicaid [place] PT Recommendations:   Not assessed at this time [place] / Referral to community resources:  Other (Comment Required) (shelters, permanent housing, [facility] project)  Patient/Family's Response to care: Patient pleasant and with appropriate affect, appreciative of care.  Patient/Family's Understanding of and Emotional Response to Diagnosis, Current Treatment, and Prognosis: Patient with some misunderstanding of need for LifeVest. CSW asked nurse to explain again to patient. Patient agreeable to recommended treatments.   Emotional Assessment Appearance:  Appears stated age Attitude/Demeanor/Rapport:  Other (appropriate) Affect (typically observed):  Appropriate, Pleasant Orientation:  Oriented to Self, Oriented to Place, Oriented to  Time, Oriented to Situation Alcohol / Substance use:  Not Applicable Psych involvement (Current and /or in the community):  No (Comment)  Discharge Needs  Concerns to be addressed:  Discharge Planning Concerns, Basic Needs, Homelessness Readmission within the last 30 days:  No Current discharge risk:  Homeless, Chronically ill Barriers to Discharge:  Continued Medical Work up, Homeless with medical needs   [name], LCSW 06/01/2017, 1:32 PM

## 2017-06-01 NOTE — Progress Notes (Signed)
Pt. Continues to complain of extreme dizziness. BP lying 99/63 HR 74. BP sitting 99/66 HR 68. Pt. Unable to stand d/t dizziness.Uncoming nurse to be notified. Kaho Selle A Mersedes Alber 06/01/2017 7:01 PM

## 2017-06-01 NOTE — Progress Notes (Signed)
CARDIAC REHAB PHASE I   PRE:  Rate/Rhythm: 33 SR  BP:  Lying: 114/58    Sitting: 95/69 (HR 77) Standing: 95/64  (HR 76)  Lying: 85/73      SaO2: 94-97 RA  MODE:  Ambulation: to chair   POST:  Rate/Rhythm: 75 SR  BP:  Sitting: 100/72, 116/72         SaO2: 98 RA  Pt in bed, states he had an episode of dizziness earlier this morning when attempting to stand.  Orthostatics taken (see above). Pt very symptomatic. Pt c/o feeling "swimmy headed," shortness of breath and feeling hot. Pt states he was prescribed blood pressure medication in the past and had similar symptoms. Pt states he does not feel ready to discharge today, in addition to his symptomatic orthostasis, pt states he does not have anywhere to go. RN aware. Pt assisted to recliner from bed, assist x2, unable to tolerate walking at this time, feet elevated, call bell within reach. Will follow as x2.   [phone] Lenna Sciara, RN, BSN 06/01/2017 10:38 AM

## 2017-06-01 NOTE — Care Management Important Message (Signed)
Important Message  Patient Details  Name: Joseph Manning MRN: [id number] Date of Birth: 1951-01-22   Medicare Important Message Given:  Yes    Joseph Manning 06/01/2017, 9:15 AM

## 2017-06-01 NOTE — Progress Notes (Addendum)
Reassessed patient's blood pressure three times lying down because of low readings. Please see Vitals Flow sheet. Joseph Manning A [name] 06/01/2017 1304

## 2017-06-01 NOTE — Progress Notes (Signed)
Patient ID: Joseph Manning, male   DOB: 27-Apr-1951, 66 y.o.   MRN: [id number]     Advanced Heart Failure Rounding Note  Primary HF Cardiologist: Aundra Dubin  Subjective:    Admitted 05/28/17 with inferior STEMI.   Feels well today. Denies chest pain and SOB. Says he feels dizzy when he first stands up. No further VT.    Objective:   Weight Range: 233 lb 3.2 oz (105.8 kg) Body mass index is 35.46 kg/m.   Vital Signs:   Temp:  [97.5 F (36.4 C)-98.5 F (36.9 C)] 98.5 F (36.9 C) (07/23 0456) Pulse Rate:  [68-82] 79 (07/23 0830) Resp:  [18-27] 25 (07/23 0830) BP: (97-151)/(60-82) 151/82 (07/23 0830) SpO2:  [96 %-100 %] 96 % (07/23 0830) Weight:  [233 lb 3.2 oz (105.8 kg)] 233 lb 3.2 oz (105.8 kg) (07/23 0456) Last BM Date: 05/31/17  Weight change: Filed Weights   05/30/17 1456 05/31/17 0431 06/01/17 0456  Weight: 234 lb 6.4 oz (106.3 kg) 233 lb 9.6 oz (106 kg) 233 lb 3.2 oz (105.8 kg)    Intake/Output:   Intake/Output Summary (Last 24 hours) at 06/01/17 0847 Last data filed at 05/31/17 2100  Gross per 24 hour  Intake              900 ml  Output                0 ml  Net              900 ml      Physical Exam    General:  Male, NAD.  HEENT: Normal Neck: Thick, no obvious JVD.  No lymphadenopathy or thyromegaly appreciated. Cor: PMI non displaced. Regular rate and rhythm. No rubs, gallops or murmurs. Lungs: Clear in upper lobes, diminished in bases.  Abdomen: Soft, No hepatosplenomegaly. No bruits or masses. Good bowel sounds. Extremities: No cyanosis, clubbing, rash. No peripheral edema.  Neuro: Alert & orientedx3, cranial nerves grossly intact. moves all 4 extremities w/o difficulty. Affect pleasant  Telemetry   NSR, personally reviewed.   EKG    7/21 NSR, persistent inferior ST elevation and V1/V2 ST depression.   Labs    CBC  Recent Labs  05/31/17 0541  WBC 12.6*  HGB 11.8*  HCT 36.6*  MCV 95.1  PLT 423*   Basic Metabolic Panel  Recent Labs  05/29/17 1233  05/30/17 0338 05/31/17 0541 06/01/17 0434  NA  --   < > 134* 137 132*  K  --   < > 5.0 4.6 4.1  CL  --   < > 100* 100* 98*  CO2  --   < > 21* 26 23  GLUCOSE  --   < > 165* 210* 177*  BUN  --   < > 55* 43* 36*  CREATININE  --   < > 1.64* 1.50* 1.28*  CALCIUM  --   < > 8.8* 8.9 8.5*  MG 2.2  --  2.4  --   --   < > = values in this interval not displayed.  BNP: BNP (last 3 results)  Recent Labs  05/29/17 0901  BNP 214.4*      Imaging   Transthoracic Echocardiography 05/28/17  Study Conclusions  - Left ventricle: The cavity size was mildly dilated. There was   mild concentric hypertrophy. Systolic function was severely   reduced. The estimated ejection fraction was in the range of 20%   to 25%. Diffuse hypokinesis. Akinesis of the inferolateral,  inferior, and basal to mid inferoseptal myocardium. Doppler   parameters are consistent with abnormal left ventricular   relaxation (grade 1 diastolic dysfunction). Doppler parameters   are consistent with indeterminate ventricular filling pressure. - Aortic valve: Transvalvular velocity was within the normal range.   There was no stenosis. There was no regurgitation. - Mitral valve: Transvalvular velocity was within the normal range.   There was no evidence for stenosis. There was mild regurgitation. - Right ventricle: The cavity size was normal. Wall thickness was   normal. Systolic function was normal. - Tricuspid valve: There was trivial regurgitation. - Pulmonary arteries: Systolic pressure was within the normal   range. PA peak pressure: 34 mm Hg (S).  Medications:     Scheduled Medications: . amiodarone  200 mg Oral BID  . aspirin EC  81 mg Oral Daily  . atorvastatin  80 mg Oral Daily  . bisoprolol  2.5 mg Oral Daily  . enoxaparin (LOVENOX) injection  40 mg Subcutaneous Q24H  . fluticasone furoate-vilanterol  1 puff Inhalation Daily  . HYDROcodone-acetaminophen  1-2 tablet Oral TID  . insulin  aspart  0-15 Units Subcutaneous TID WC  . insulin aspart  0-5 Units Subcutaneous QHS  . pantoprazole  40 mg Oral BID  . sodium chloride flush  3 mL Intravenous Q12H  . spironolactone  12.5 mg Oral Daily  . ticagrelor  90 mg Oral BID  . tiotropium  18 mcg Inhalation Daily    Infusions: . sodium chloride    . sodium chloride      PRN Medications: sodium chloride, acetaminophen, albuterol, alum & mag hydroxide-simeth, hydrALAZINE, morphine injection, nitroGLYCERIN, ondansetron (ZOFRAN) IV, sodium chloride flush    Patient Profile   Joseph Manning is a 66 y.o. male with history of HTN, DM2, COPD who presented to [facility] with CP. Found to have postero- inferior STEMI. Now s/p post complex RCA PCI and stenting with PTCA LCx.   Assessment/Plan   1. CAD: s/p inferior STEMI with DES to RCA and PTCA small LCx.  Has residual 70% stenosis in LCx and 50-60% stenoses in LAD.   - No further chest pain. - Continue DAPT with ASA and Brilinta.  - Change bisoprolol to 2.5 mg at bedtime. Lowest strength tablet is 5 mg, so cannot reduce any further than 2.5 mg .  - Continue atorvastatin 80 mg daily.   2. Acute systolic CHF: Ischemic cardiomyopathy, EF 20-25%.  - NYHA II-III - Volume status stable on exam. - Orthostatic vitals charted by cardiac rehab. Lying BP is 114/58, standing 95/64. Will cut bisoprolol in half and change Spiro to hs dosing.   3. AKI:  - creatinine improving   4. VT: During PCI, had VT requiring defibrillation.  On 7/20, had a run of sustained VT that self-terminated (around 24 hours after PCI). - Order placed for LifeVest  - No further VT.   5. COPD:  - no wheezing. Continue home inhalers.   Dispo: Contacted rep and order placed for LifeVest. Dc home today.   Length of Stay: [place], NP  06/01/2017, 8:47 AM  Advanced Heart Failure Team Pager [phone] (M-F; 7a - 4p)  Please contact [facility] for night-coverage after hours (4p -7a ) and weekends on  [email or web address]  Patient seen with NP, agree with the above note.  Orthostatic.  Creatinine coming down. No chest pain.  - Decrease bisoprolol and change spironolactone to qhs as above.   He is homeless and we are  trying to work on a living situation for him.  He will need to stay in the hospital today while we work on this.  He will need Lifevest at discharge.   Loralie Champagne 06/01/2017

## 2017-06-02 LAB — BASIC METABOLIC PANEL
Anion gap: 11 (ref 5–15)
BUN: 29 mg/dL — ABNORMAL HIGH (ref 6–20)
CO2: 26 mmol/L (ref 22–32)
CREATININE: 1.38 mg/dL — AB (ref 0.61–1.24)
Calcium: 8.9 mg/dL (ref 8.9–10.3)
Chloride: 98 mmol/L — ABNORMAL LOW (ref 101–111)
GFR calc non Af Amer: 51 mL/min — ABNORMAL LOW (ref >60)
GFR, EST AFRICAN AMERICAN: 60 mL/min — AB (ref >60)
Glucose, Bld: 217 mg/dL — ABNORMAL HIGH (ref 65–99)
POTASSIUM: 4.8 mmol/L (ref 3.5–5.1)
SODIUM: 135 mmol/L (ref 135–145)

## 2017-06-02 LAB — GLUCOSE, CAPILLARY
GLUCOSE-CAPILLARY: 221 mg/dL — AB (ref 65–99)
GLUCOSE-CAPILLARY: 191 mg/dL — AB (ref 65–99)
Glucose-Capillary: 188 mg/dL — ABNORMAL HIGH (ref 65–99)

## 2017-06-02 MED ORDER — NITROGLYCERIN 0.4 MG SL SUBL: 0.4000 mg | SUBLINGUAL_TABLET | SUBLINGUAL | 2 refills | Status: AC | PRN

## 2017-06-02 MED ORDER — FLUTICASONE FUROATE-VILANTEROL 200-25 MCG/INH IN AEPB
1.0000 | INHALATION_SPRAY | Freq: Every day | RESPIRATORY_TRACT | 12 refills | Status: AC
Start: 2017-06-03 — End: ?

## 2017-06-02 MED ORDER — TICAGRELOR 90 MG PO TABS: 90.0000 mg | ORAL_TABLET | Freq: Two times a day (BID) | ORAL | 0 refills | Status: AC

## 2017-06-02 MED ORDER — TIOTROPIUM BROMIDE MONOHYDRATE 18 MCG IN CAPS: 18.0000 ug | ORAL_CAPSULE | Freq: Every day | RESPIRATORY_TRACT | 12 refills | Status: AC

## 2017-06-02 MED ORDER — AMIODARONE HCL 200 MG PO TABS: ORAL_TABLET | ORAL | 6 refills | Status: AC

## 2017-06-02 MED ORDER — HYDROCODONE-ACETAMINOPHEN 5-325 MG PO TABS
1.0000 | ORAL_TABLET | Freq: Three times a day (TID) | ORAL | Status: DC
  Administered 2017-06-02 (×2): 1 via ORAL
  Filled 2017-06-02 (×2): qty 1

## 2017-06-02 MED ORDER — TICAGRELOR 90 MG PO TABS: 90.0000 mg | ORAL_TABLET | Freq: Two times a day (BID) | ORAL | 12 refills | Status: AC

## 2017-06-02 MED ORDER — ATORVASTATIN CALCIUM 80 MG PO TABS: 80.0000 mg | ORAL_TABLET | Freq: Every day | ORAL | 12 refills | Status: AC

## 2017-06-02 MED ORDER — SPIRONOLACTONE 25 MG PO TABS: 12.5000 mg | ORAL_TABLET | Freq: Every day | ORAL | 12 refills | Status: AC

## 2017-06-02 MED FILL — AMIODARONE HCL 200 MG TAB: 200 | 34 days supply | Qty: 41 | Fill #0

## 2017-06-02 MED FILL — SPIRONOLACTONE 25 MG TABLET: 25 | 34 days supply | Qty: 17 | Fill #0

## 2017-06-02 NOTE — Discharge Summary (Signed)
Advanced Heart Failure Discharge Note  Discharge Summary   Patient ID: Joseph Manning MRN: [id number], DOB/AGE: [birth year] 66 y.o. Admit date: 05/28/2017 D/C date:     06/02/2017   Primary Discharge Diagnoses:  1. Inferior STEMI 2. Acute systolic CHF 3. AKI 4. VT 5. COPD 6. Orthostatic hypotension  Hospital Course:   Joseph Manning is a 66 year old male with a past medical history of HTN, DM, COPD. He presented to the ED on 05/28/17 with a 2 day history of chest pain and SOB. EKG with inferior ST elevation and Q waves. Troponin was >65. He was taken urgently to the cath lab and had DES placed to his RCA and PTCA to a small LCx. He developed VT during the procedure, shocked x 1. He was started on IV amiodarone.   Echo showed an EF of 20-25%. It was felt that there was no indication for ICD implantation as his VT was less than 48 hours post MI. He was set up for a LifeVest.   Joseph Manning is homeless and was living in a tent prior to admission. Social work set up housing for him as he will need electricity to charge batteries for his LifeVest. He was started on bisoprolol (has severe COPD), he could not tolerate it with orthostatic hypotension. We tried to reduce dose and give hs, still remained dizzy.  Plan is to continue his amiodarone 200 mg bid x week then 200 mg daily.  Given COPD, would probably only continue a limited amount of time, likely a month or so post-event.  1. CAD: s/p inferior STEMI with DES to RCA and PTCA small LCx. Has residual 70% stenosis in LCx and 50-60% stenoses in LAD.  - No further chest pain. - Continue DAPT with ASA and Brilinta.  - Will stop bisoprolol. He continues to be orthostatic and has not tolerated beta blockers in the past.  - Continue atorvastatin 80 mg daily.   2. Acute systolic CHF: Ischemic cardiomyopathy, EF 20-25%.  - NYHA II-III - Volume status stable on exam.  - Continue 12.5 mg Spiro hs - Will not add any other HF meds with orthostasis.    3. AKI:  - Creatinine 1.38  - Continue to monitor.   4. VT: During PCI, had VT requiring defibrillation. On 7/20, had a run of sustained VT that self-terminated (around 24 hours after PCI). - Order placed for LifeVest - No further VT.   5. COPD:  - no wheezing. Continue home inhalers.  - no change  6. Hypotension - stopping bisoprolol.  - Was also getting 10mg  of hydrocodone TID + 2mg  IV morphine for chronic back pain. Will reduce hydrocodone to 5 mg TID and stop IV morphine   He was seen today by Dr. Aundra Dubin and deemed suitable for discharge. His medications were provided by the HF fund and given to him at the bedside prior to DC. He was fitted for his LifeVest and education was completed. He was given Brilinta samples at the bedside, a 30 day free card was provided as well.   Discharge Weight: 232 pounds.  Discharge Vitals: Blood pressure 109/62, pulse 64, temperature 97.8 F (36.6 C), temperature source Axillary, resp. rate 18, height 5\' 8"  (1.727 m), weight 232 lb 11.2 oz (105.6 kg), SpO2 99 %.  Labs: Lab Results  Component Value Date   WBC 12.6 (H) 05/31/2017   HGB 11.8 (L) 05/31/2017   HCT 36.6 (L) 05/31/2017   MCV 95.1 05/31/2017   PLT  423 (H) 05/31/2017    Recent Labs Lab 05/28/17 0030  06/02/17 0145  NA 137  136  < > 135  K 4.2  4.2  < > 4.8  CL 102  101  < > 98*  CO2 22  22  < > 26  BUN 33*  33*  < > 29*  CREATININE 1.60*  1.58*  < > 1.38*  CALCIUM 9.3  9.2  < > 8.9  PROT 8.5*  --   --   BILITOT 0.8  --   --   ALKPHOS 91  --   --   ALT 72*  --   --   AST 217*  --   --   GLUCOSE 328*  330*  < > 217*  < > = values in this interval not displayed. Lab Results  Component Value Date   CHOL 245 (H) 05/28/2017   HDL 51 05/28/2017   LDLCALC 165 (H) 05/28/2017   TRIG 146 05/28/2017   BNP (last 3 results)  Recent Labs  05/29/17 0901  BNP 214.4*    ProBNP (last 3 results) No results for input(s): PROBNP in the last 8760  hours.   Diagnostic Studies/Procedures   Coronary Stent Intervention  Left Heart Cath and Coronary Angiography 05/28/17    Mid LAD lesion, 60 %stenosed.  Mid LAD to Dist LAD lesion, 50 %stenosed.  Prox Cx to Mid Cx lesion, 100 %stenosed.  Post intervention, there is a 70% residual stenosis.  Prox RCA to Dist RCA lesion, 100 %stenosed.  Post intervention, there is a 0% residual stenosis.  A stent was successfully placed.   Transthoracic Echocardiography 05/28/17 Study Conclusions  - Left ventricle: The cavity size was mildly dilated. There was   mild concentric hypertrophy. Systolic function was severely   reduced. The estimated ejection fraction was in the range of 20%   to 25%. Diffuse hypokinesis. Akinesis of the inferolateral,   inferior, and basal to mid inferoseptal myocardium. Doppler   parameters are consistent with abnormal left ventricular   relaxation (grade 1 diastolic dysfunction). Doppler parameters   are consistent with indeterminate ventricular filling pressure. - Aortic valve: Transvalvular velocity was within the normal range.   There was no stenosis. There was no regurgitation. - Mitral valve: Transvalvular velocity was within the normal range.   There was no evidence for stenosis. There was mild regurgitation. - Right ventricle: The cavity size was normal. Wall thickness was   normal. Systolic function was normal. - Tricuspid valve: There was trivial regurgitation. - Pulmonary arteries: Systolic pressure was within the normal   range. PA peak pressure: 34 mm Hg (S).     Discharge Medications   Allergies as of 06/02/2017      Reactions   Fentanyl Other (See Comments)   unknown   Versed [midazolam] Other (See Comments)   unknown      Medication List    STOP taking these medications   diazepam 10 MG tablet Commonly known as:  VALIUM   hydrocortisone 2.5 % rectal cream Commonly known as:  ANUSOL-HC   polyethylene glycol powder  powder Commonly known as:  GLYCOLAX/MIRALAX     TAKE these medications   albuterol 108 (90 Base) MCG/ACT inhaler Commonly known as:  PROVENTIL HFA;VENTOLIN HFA Inhale 2 puffs into the lungs every 6 (six) hours as needed for wheezing or shortness of breath.   amiodarone 200 MG tablet Commonly known as:  PACERONE Take 200 mg bid x week then 200 mg daily.  aspirin 81 MG EC tablet Take 1 tablet (81 mg total) by mouth daily.   atorvastatin 80 MG tablet Commonly known as:  LIPITOR Take 1 tablet (80 mg total) by mouth daily. What changed:  medication strength  how much to take   fluticasone furoate-vilanterol 200-25 MCG/INH Aepb Commonly known as:  BREO ELLIPTA Inhale 1 puff into the lungs daily.   HYDROcodone-acetaminophen 5-325 MG tablet Commonly known as:  NORCO/VICODIN Take 1-2 tablets by mouth See admin instructions. Take 2 tablets by mouth in the morning, take 1 tablet by mouth at lunch and take 2 tablets by mouth at night   nitroGLYCERIN 0.4 MG SL tablet Commonly known as:  NITROSTAT Place 1 tablet (0.4 mg total) under the tongue every 5 (five) minutes x 3 doses as needed for chest pain.   NOVOLOG MIX 70/30 FLEXPEN (70-30) 100 UNIT/ML FlexPen Generic drug:  insulin aspart protamine - aspart Inject 50 Units as directed daily.   pantoprazole 40 MG tablet Commonly known as:  PROTONIX Take 1 tablet (40 mg total) by mouth 2 (two) times daily.   spironolactone 25 MG tablet Commonly known as:  ALDACTONE Take 0.5 tablets (12.5 mg total) by mouth at bedtime.   ticagrelor 90 MG Tabs tablet Commonly known as:  BRILINTA Take 1 tablet (90 mg total) by mouth 2 (two) times daily.   ticagrelor 90 MG Tabs tablet Commonly known as:  BRILINTA Take 1 tablet (90 mg total) by mouth 2 (two) times daily.   tiotropium 18 MCG inhalation capsule Commonly known as:  SPIRIVA Place 1 capsule (18 mcg total) into inhaler and inhale daily.       Disposition   The patient will be  discharged in stable condition to home. Discharge Instructions    (HEART FAILURE PATIENTS) Call MD:  Anytime you have any of the following symptoms: 1) 3 pound weight gain in 24 hours or 5 pounds in 1 week 2) shortness of breath, with or without a dry hacking cough 3) swelling in the hands, feet or stomach 4) if you have to sleep on extra pillows at night in order to breathe.    Complete by:  As directed    AMB Referral to Cardiac Rehabilitation - Phase II    Complete by:  As directed    Diagnosis:  STEMI   Amb Referral to Cardiac Rehabilitation    Complete by:  As directed    Diagnosis:   STEMI Coronary Stents     Diet - low sodium heart healthy    Complete by:  As directed    Heart Failure patients record your daily weight using the same scale at the same time of day    Complete by:  As directed    Increase activity slowly    Complete by:  As directed    STOP any activity that causes chest pain, shortness of breath, dizziness, sweating, or exessive weakness    Complete by:  As directed      Follow-up Information    Larey Dresser, MD Follow up on 06/10/2017.   Specialty:  Cardiology Why:  at 9:40 am for hospital follow up. Garage code [id number]. (try [phone] if [phone] doesn't work).  Contact information: [place] [address] [phone]             Duration of Discharge Encounter: Greater than 35 minutes   Signed, TYJAE ISSA, NP 06/02/2017, 3:31 PM

## 2017-06-02 NOTE — Progress Notes (Signed)
Patient provided medications through the HF fund and instructed to take as follows: Spirononlactone 12.5 daily Amiodarone 200 mg bid x 1 week- then 200 mg daily. Per HF Pharm D--Samples of Brilinta 90 mg - to take twice daily.

## 2017-06-02 NOTE — Care Management Note (Signed)
Case Management Note  Patient Details  Name: Joseph Manning MRN: [id number] Date of Birth: 1951/05/29  Subjective/Objective:  Pt presented for Stemi- PTA pt was living in a Tent. Pt needed electricity due to Life Vest for Home.  Life vest ordered via [employer]. CSW worked with pt in regards to Housing. Pt will d/c with the assistance of [employer].                 Action/Plan: No further needs from CM at this time.   Expected Discharge Date:  06/02/17               Expected Discharge Plan:  Home/Self Care  In-House Referral:  Clinical Social Work  Discharge planning Services  CM Consult  Post Acute Care Choice:  Durable Medical Equipment Choice offered to:  Patient  DME Arranged:  Life vest DME Agency:  [employer]  HH Arranged:  NA [place] Agency:  NA  Status of Service:  Completed, signed off  If discussed at [employer] of Stay Meetings, dates discussed:    Additional Comments:  Bethena Roys, RN 06/02/2017, 3:31 PM

## 2017-06-02 NOTE — Progress Notes (Signed)
Pt. Did not tolerate sitting up in chair. Dizzy with a BP 103/60 HR 64. Helped back to bed. SOB. Olivier Frayre A Kennie Karapetian 06/02/2017 10:26 AM

## 2017-06-02 NOTE — Progress Notes (Signed)
Pt. Continues to complain of dizziness and intolerance to sitting up. Orthostatic vitals taken and negative. Documentation in [place]. Maiana Hennigan A Ferlin [place] 06/02/2017 12:29 PM

## 2017-06-02 NOTE — Progress Notes (Signed)
Patient ID: Joseph Manning, male   DOB: 18-Jul-1951, 66 y.o.   MRN: [id number]     Advanced Heart Failure Rounding Note  Primary HF Cardiologist: Aundra Dubin  Subjective:    Admitted 05/28/17 with inferior STEMI.   Yesterday, had dizziness and was orthostatic. Continues to have dizziness and light headedness. Denies chest pain, has SOB with exertion.    Objective:   Weight Range: 232 lb 11.2 oz (105.6 kg) Body mass index is 35.38 kg/m.   Vital Signs:   Temp:  [97.7 F (36.5 C)-98.4 F (36.9 C)] 98.4 F (36.9 C) (07/24 0500) Pulse Rate:  [63-68] 68 (07/24 0500) Resp:  [15-20] 20 (07/24 0500) BP: (100-113)/(52-82) 113/63 (07/24 0500) SpO2:  [95 %-100 %] 95 % (07/24 0842) Weight:  [232 lb 11.2 oz (105.6 kg)] 232 lb 11.2 oz (105.6 kg) (07/24 0500) Last BM Date: 06/01/17  Weight change: Filed Weights   05/31/17 0431 06/01/17 0456 06/02/17 0500  Weight: 233 lb 9.6 oz (106 kg) 233 lb 3.2 oz (105.8 kg) 232 lb 11.2 oz (105.6 kg)    Intake/Output:   Intake/Output Summary (Last 24 hours) at 06/02/17 0932 Last data filed at 06/01/17 1900  Gross per 24 hour  Intake              846 ml  Output              300 ml  Net              546 ml      Physical Exam    General: Ill appearing male. . No resp difficulty. HEENT: Normal Neck: Supple. JVP 5-6. Carotids 2+ bilat; no bruits. No thyromegaly or nodule noted. Cor: PMI nondisplaced. RRR, No M/G/R noted Lungs: CTAB, normal effort. Abdomen: Soft, non-tender, non-distended, no HSM. No bruits or masses. +BS  Extremities: No cyanosis, clubbing, rash, R and LLE no edema.  Neuro: Alert & orientedx3, cranial nerves grossly intact. moves all 4 extremities w/o difficulty. Affect pleasant   Telemetry   NSR, personally reviewed.   EKG    7/23 - NSR, persistent inferior ST elevation - personally reviewed.   Labs    CBC  Recent Labs  05/31/17 0541  WBC 12.6*  HGB 11.8*  HCT 36.6*  MCV 95.1  PLT 854*   Basic Metabolic  Panel  Recent Labs  06/01/17 0434 06/02/17 0145  NA 132* 135  K 4.1 4.8  CL 98* 98*  CO2 23 26  GLUCOSE 177* 217*  BUN 36* 29*  CREATININE 1.28* 1.38*  CALCIUM 8.5* 8.9    BNP: BNP (last 3 results)  Recent Labs  05/29/17 0901  BNP 214.4*      Imaging   Transthoracic Echocardiography 05/28/17  Study Conclusions  - Left ventricle: The cavity size was mildly dilated. There was   mild concentric hypertrophy. Systolic function was severely   reduced. The estimated ejection fraction was in the range of 20%   to 25%. Diffuse hypokinesis. Akinesis of the inferolateral,   inferior, and basal to mid inferoseptal myocardium. Doppler   parameters are consistent with abnormal left ventricular   relaxation (grade 1 diastolic dysfunction). Doppler parameters   are consistent with indeterminate ventricular filling pressure. - Aortic valve: Transvalvular velocity was within the normal range.   There was no stenosis. There was no regurgitation. - Mitral valve: Transvalvular velocity was within the normal range.   There was no evidence for stenosis. There was mild regurgitation. - Right ventricle: The cavity  size was normal. Wall thickness was   normal. Systolic function was normal. - Tricuspid valve: There was trivial regurgitation. - Pulmonary arteries: Systolic pressure was within the normal   range. PA peak pressure: 34 mm Hg (S).  Medications:     Scheduled Medications: . amiodarone  200 mg Oral BID  . aspirin EC  81 mg Oral Daily  . atorvastatin  80 mg Oral Daily  . enoxaparin (LOVENOX) injection  40 mg Subcutaneous Q24H  . fluticasone furoate-vilanterol  1 puff Inhalation Daily  . HYDROcodone-acetaminophen  1-2 tablet Oral TID  . insulin aspart  0-15 Units Subcutaneous TID WC  . insulin aspart  0-5 Units Subcutaneous QHS  . pantoprazole  40 mg Oral BID  . sodium chloride flush  3 mL Intravenous Q12H  . spironolactone  12.5 mg Oral QHS  . ticagrelor  90 mg Oral  BID  . tiotropium  18 mcg Inhalation Daily    Infusions: . sodium chloride    . sodium chloride      PRN Medications: sodium chloride, acetaminophen, albuterol, alum & mag hydroxide-simeth, hydrALAZINE, morphine injection, nitroGLYCERIN, ondansetron (ZOFRAN) IV, sodium chloride flush    Patient Profile   Joseph Manning is a 66 y.o. male with history of HTN, DM2, COPD who presented to [facility] with CP. Found to have postero- inferior STEMI. Now s/p post complex RCA PCI and stenting with PTCA LCx.   Assessment/Plan   1. CAD: s/p inferior STEMI with DES to RCA and PTCA small LCx.  Has residual 70% stenosis in LCx and 50-60% stenoses in LAD.   - No further chest pain. - Continue DAPT with ASA and Brilinta.  - Will stop bisoprolol. He continues to be orthostatic and has not tolerated beta blockers in the past.  - Continue atorvastatin 80 mg daily.   2. Acute systolic CHF: Ischemic cardiomyopathy, EF 20-25%.  - NYHA II-III - Volume status stable on exam.  - Continue 12.5 mg Spiro hs - Will not add any other HF meds with orthostasis.   3. AKI:  - Creatinine increased 1.28 to 1.38 today.  - Continue to monitor.   4. VT: During PCI, had VT requiring defibrillation.  On 7/20, had a run of sustained VT that self-terminated (around 24 hours after PCI). - Order placed for LifeVest. Tried to fit him for a vest yesterday and he was too dizzy to sit on the edge of the bed.  - No further VT.   5. COPD:  - no wheezing. Continue home inhalers.  - no change  6. Hypotension - stopping bisoprolol.  - Was also getting 10mg  of hydrocodone TID + 2mg  IV morphine for chronic back pain. Will reduce hydrocodone to 5 mg TID and stop IV morphine  Dispo: Social work working on placement. Plan to fit for Lifevest today.   Length of Stay: [place], NP  06/02/2017, 9:32 AM  Advanced Heart Failure Team Pager [phone] (M-F; 7a - 4p)  Please contact [facility] for night-coverage after  hours (4p -7a ) and weekends on [email or web address]  Patient seen with NP, agree with the above note.  We had to stop bisoprolol due to lightheadedness.  Otherwise, stable with no more chest pain or VT.  Creatinine stable.  - Has significant COPD, continue inhalers.  - Can continue qhs spironolactone.  - Amiodarone 200 mg bid x week then 200 mg daily.  Given COPD, would probably only continue a limited amount of time, likely a month  or so post-event.  He will have Lifevest as well.   Has housing set up, can go today.   Will followup in CHF clinic and get paramedicine to follow.   Loralie Champagne 06/02/2017 2:27 PM

## 2017-06-02 NOTE — Progress Notes (Signed)
CSW coordinated patient referral to [facility] and [facility] accepted patient. CSW coordinated patient's discharge with supervisor, Daphene Calamity, to hotel where [employer] nurse and social worker will provide services. Patient agreeable to conditions for admission to [facility]. CSW discussed plan with patient's daughter, Anderson Malta. Daughter will pick patient up from hospital and take patient to his vehicle, which is located at [employer]. Patient and daughter will then go to hotel. Still pending patient LifeVest fitting before discharge.   [name], [place]

## 2017-06-02 NOTE — Discharge Instructions (Signed)
Coronary Artery Disease Treatment Decision Aid Introduction If you have coronary artery disease, you may have several treatment options. This document will review three of those options.  What are my treatment options? ? Stenting ? Medicines can treat and prevent heart disease. Taking medicines may be referred to as Optimal Medical Therapy.: This treatment involves procedures to widen blocked arteries by placing a piece of metal that looks like a coil or spring (stent) into blocked arteries. A small tube (catheter) is inserted into an artery in your groin, your wrist, or the fold of your arm. The catheter is used to guide the stent into place. This treatment is also called angioplasty or angioplasty with stent. Medicines are also needed. ? Heart Bypass Surgery ? Medicines can treat and prevent heart disease. Taking medicines may be referred to as Optimal Medical Therapy.: This treatment involves surgery to allow blood to flow past (bypass) blocked coronary arteries. A section of a blood vessel from another part of the body (usually the leg, arm, or chest) is removed and then inserted into the affected vessel. Like a bypass on a blocked road, this allows blood to bypass the blocked part of the coronary artery. This surgery may be done through a large incision in the front of the chest (open technique) or through an incision over the left chest area (minimally invasive technique). Medicines are also needed.  Who is eligible?  Good candidates may include: ? Stenting ? People with stable ischemic heart disease.: People who have tried medicine-only treatment but continue to have chest pain (angina) that affects quality of life. ? People with coronary stenosis that is not severe, is not in a high-risk area, or supplies only a small area of the heart.: People who have experienced unacceptable side effects of medicines. ? : People with severe or complete coronary artery blockage. ? : People who have  survived a sudden cardiac arrest and have a severe blockage in at least one coronary artery. ? : ? : ? Heart Bypass Surgery ? People with stable ischemic heart disease.: People who have tried medicine-only treatment but continue to have angina that affects quality of life. ? People with coronary stenosis that is not severe, is not in a high-risk area, or supplies only a small area of the heart.: People who have experienced unacceptable side effects of medicines. ? : People with severe or complete coronary artery blockage. ? : People who have survived a sudden cardiac arrest and have a severe blockage in at least one coronary artery. ? : People with multiple coronary artery blockages. ? : People with diabetes. People who have diabetes and multiple blockages may have better long-term success with surgery instead of stenting. What are the benefits?  Benefits may include: ? Stenting ? Prevention of heart attack and death.: Prevention of heart attack and death. ? Not needing any procedures or surgeries.: Ability to have an angiogram at the same time as this procedure to find any other coronary artery blockages. ? : Quick and complete relief of chest pain (angina) symptoms. ? : Needing to take fewer medicines. ? : ? Heart Bypass Surgery ? Prevention of heart attack and death.: Prevention of heart attack and death. ? Not needing any procedures or surgeries.: Improved chance of living longer, for people who have a blockage in the left main coronary artery. ? : Quick and complete relief of chest pain (angina) symptoms. ? : Needing to take fewer medicines. ? : What are the risks?  Risks may  include: ? Stenting ? Side effects from nitrates, such as headache, dizziness, and nausea.: Bleeding in the groin (hematoma) or abdomen (retroperitoneal bleeding). ? Side effects from blood pressure medicines, such as cough, dizziness, or feeling tired.: Return of the blockage (restenosis). ? Side effects  from anticoagulant medicines, such as bruising or bleeding.: Needing emergency coronary artery bypass grafting. ? Allergic reaction.: Kidney injury. ? : Infection. ? : Possibility of a reaction to anesthetic medicines. ? : Bleeding caused by medicines that are taken after the procedure. ? : Heart attack. This is rare. ? : Stroke or blood clot. This is rare. ? : ? : ? Heart Bypass Surgery ? Side effects from nitrates, such as headache, dizziness, and nausea.: Bleeding. ? Side effects from blood pressure medicines, such as cough, dizziness, or feeling tired.: Heart attack. ? Side effects from anticoagulant medicines, such as bruising or bleeding.: Stroke or blood clot. ? Allergic reaction.: Kidney injury. ? : Infection. ? : Possibility of a reaction to anesthetic medicines. ? : Scarring. ? : Possibility of pain and discomfort after the surgery. ? : ? : ? : What preparation is needed?  Preparation may include: ? Stenting ? None: Physical evaluation. This may involve lab work and X-rays. ? : Adjusting medicines that you take or taking new medicines, depending on other medical conditions that you have. ? Heart Bypass Surgery ? None: Physical evaluation. This may involve lab work and X-rays. ? : Adjusting medicines that you take or taking new medicines, depending on other medical conditions that you have. The exact tests and frequency of office visits will be decided by your health care provider based on your individual condition. What are the restrictions after?  Restrictions may include: ? Stenting ? Needing to avoid activities that have a high risk of injury, such as contact sports or working with sharp objects. These medicines can cause internal bleeding.: Needing to avoid activities for several months that have a high risk of injury, such as contact sports or working with sharp objects. These medicines can cause internal bleeding. ? : Not lifting anything that is heavier than 10 lb  (4.5 kg) for 3-7 days after the procedure. ? : Not driving for 3-7 days after the procedure. ? : Not doing activities that require a lot of energy for 3-7 days after the procedure. ? Heart Bypass Surgery ? Needing to avoid activities that have a high risk of injury, such as contact sports or working with sharp objects. These medicines can cause internal bleeding.: Avoiding crossing your legs or sitting for long periods at a time, if incisions were made in your legs. ? : Avoiding lifting, pushing, or pulling anything that is heavier than 10 lb (4.5 kg) for at least 6 weeks after surgery. ? : Not driving until your provider approves, usually at least 6 weeks after surgery. ? : What can I expect from recovery?  You may experience: ? Stenting ? Side effects such as drowsiness or lack of energy. Side effects vary.: Recovery within a week, usually faster than heart bypass surgery. ? Regular follow-up visits to check how well medicines are working.: ? Heart Bypass Surgery ? Side effects such as drowsiness or lack of energy. Side effects vary.: Recovery in 4-6 weeks or longer. Recovery time varies. ? Regular follow-up visits to check how well medicines are working.: Pain that may last for several weeks. This varies. What is the impact to quality of life?  Impact to quality of life may include: ?  Stenting ? Living with certain side effects.: Needing to take blood thinners for several months after the procedure. During this time, you will need to avoid activities that have a high risk of injury. ? Needing to avoid activities that have a high risk of injury such as contact sports or working with sharp objects. Medications can increase risk for bleeding.: Making time for follow-up visits. ? Making time for regular lab tests and follow-up visits.: Taking a few days off from work to recover. ? : ? Heart Bypass Surgery ? Living with certain side effects.: Taking up to 6 weeks off from work and strenuous  activities to recover. ? Needing to avoid activities that have a high risk of injury such as contact sports or working with sharp objects. Medications can increase risk for bleeding.: Having a scar from surgery. ? Making time for regular lab tests and follow-up visits.: Making time for follow-up visits. ? : Limitations on lifting and driving, usually for about 6 weeks. This may impact your regular schedule, including work. The frequency of office visits, therapy, and treatments will be decided by your health care provider and care team based on your individual condition. What is the cost?  Costs may include: ? Stenting - $$ ? Ongoing medicines.: Procedures and recovery. ? Heart Bypass Surgery - $$$ ? Ongoing medicines.: Procedures and recovery. Exact costs will vary based on your location and insurance plan. Contact your insurance company to find out what is covered. Questions to ask your health care provider:  Ask: ? Stenting ? Am I at risk for a heart attack?: Am I at risk for a heart attack? ? How will I know if the medicines are working?: Am I at risk for complications from this treatment? ? What can I do to lower my risk of a heart attack?: What can I do to lower my risk of a heart attack? ? Am I eligible for this option?: Am I eligible for this option? ? What are my risks if I choose this option?: What are my personal risks? ? What are my personal risks?: ? Heart Bypass Surgery ? Am I at risk for a heart attack?: Am I at risk for a heart attack? ? How will I know if the medicines are working?: Am I at risk for complications from surgery? ? What can I do to lower my risk of a heart attack?: What can I do to lower my risk of a heart attack? ? Am I eligible for this option?: Am I eligible for this option? ? What are my risks if I choose this option?: What are my personal risks? ? What are my personal risks?: Follow these instructions at home: Review these options and decide which  one may be right for you.  My decision: ? Medicines Only ? This is right for me:: ? This is not right for me:: ? I am unsure right now:: ? Stenting ? This is right for me:: ? This is not right for me:: ? I am unsure right now:: ? Heart Bypass Surgery ? This is right for me:: ? This is not right for me:: ? I am unsure right now::  This information is not intended to replace advice given to you by your health care provider. Make sure you discuss any questions you have with your health care provider. Document Released: 09/18/2016 Document Revised: 09/18/2016 Document Reviewed: 09/18/2016 Elsevier Interactive Patient Education  2018 [employer].   Heart Attack A heart attack (myocardial infarction,  MI) causes damage to the heart that cannot be fixed. A heart attack often happens when a blood clot or other blockage cuts blood flow to the heart. When this happens, certain areas of the heart begin to die. This causes the pain you feel during a heart attack. Follow these instructions at home:  Take medicine as told by your doctor. You may need medicine to: ? Keep your blood from clotting too easily. ? Control your blood pressure. ? Lower your cholesterol. ? Control abnormal heart rhythms.  Change certain behaviors as told by your doctor. This may include: ? Quitting smoking. ? Being active. ? Eating a heart-healthy diet. Ask your doctor for help with this diet. ? Keeping a healthy weight. ? Keeping your diabetes under control. ? Lessening stress. ? Limiting how much alcohol you drink. Do not take these medicines unless your doctor says that you can:  Nonsteroidal anti-inflammatory drugs (NSAIDs). These include: ? Ibuprofen. ? Naproxen. ? Celecoxib.  Vitamin supplements that have vitamin A, vitamin E, or both.  Hormone therapy that contains estrogen with or without progestin.  Get help right away if:  You have sudden chest discomfort.  You have sudden discomfort in  your: ? Arms. ? Back. ? Neck. ? Jaw.  You have shortness of breath at any time.  You have sudden sweating or clammy skin.  You feel sick to your stomach (nauseous) or throw up (vomit).  You suddenly get light-headed or dizzy.  You feel your heart beating fast or skipping beats. These symptoms may be an emergency. Do not wait to see if the symptoms will go away. Get medical help right away. Call your local emergency services (911 in the [place] Do not drive yourself to the hospital. This information is not intended to replace advice given to you by your health care provider. Make sure you discuss any questions you have with your health care provider. Document Released: 04/27/2012 Document Revised: 04/03/2016 Document Reviewed: 12/30/2013 Elsevier Interactive Patient Education  2017 [employer].

## 2017-06-02 NOTE — Progress Notes (Signed)
Pt continues to unplug monitor and get OOB without calling for assistance. Had discussion with him about calling for assistance and NOT unplugging himself from monitor. Pt walks to bathroom w/steady gait however he urinates in floor attempting to use restroom. Pt also refuses to wear yellow socks. Will continue to monitor. Jessie Foot, RN

## 2017-06-02 NOTE — Progress Notes (Signed)
Pt. Ambulated in the hallway. Was able to walk halfway down the hall before needing to turn back. Wheezes auscultated on expiration once patient got back to bed. Labored breathing. Spo2 remained normal and no O2 needed. Taelar Gronewold A Ellina Sivertsen 06/02/2017

## 2017-06-03 ENCOUNTER — Telehealth (HOSPITAL_COMMUNITY): Payer: Self-pay

## 2017-06-03 ENCOUNTER — Telehealth: Payer: Self-pay | Admitting: Licensed Clinical Social Worker

## 2017-06-03 NOTE — Telephone Encounter (Signed)
CSW referred to assist patient with resources and possible transportation to clinic appointment on 8/1. CSW contacted patient to follow up and offer assistance as well as transport options for appointment next week. Patient denies any concerns at the moment and aware of clinic visit next week. CSW also contacted [place] program and left message for RN and CSW to return call to coordinate efforts for patient. CSW will follow up next week with patient at clinic appointment. Raquel Sarna, [place], [place]

## 2017-06-03 NOTE — Consult Note (Signed)
           [facility] CM Primary Care Navigator  06/03/2017  Joseph Manning 1950-11-17 [id number]   Went to see patientat the bedsideto identify possible discharge needs but he was alreadydischarged.  Patient was discharged homeyesterday per staff report.  Primary care provider's officecalled (Lisa)to notify of patient's discharge and need for post hospital follow-up and transition of care. Updated of patient's status and reminded of patient's health issues needing follow-up as well.  Made aware to refer patient to [facility] care management asdeemed necessary and appropriate for services.   For questions, please contact:  Dannielle Huh, BSN, RN- [facility] Primary Care Navigator  Telephone: [phone] [place]

## 2017-06-03 NOTE — Telephone Encounter (Signed)
Patient insurances are active and benefits verified. Patient has Medicare A/B and Medicaid.  Medicare A/B - no co-payment, no deductible, no out of pocket, no co-insurance and no pre-authorization. Passport/reference [id number]. Medicaid - no co-payment, no deductible, no out of pocket, no co-insurance and no pre-authorization. Passport/reference [phone].   Patient will be contacted and scheduled after their follow up appointment with the cardiologist office on 06/10/17, upon review by [facility] RN navigator.

## 2017-06-04 ENCOUNTER — Encounter: Payer: Self-pay | Admitting: Critical Care Medicine

## 2017-06-04 ENCOUNTER — Telehealth (HOSPITAL_COMMUNITY): Payer: Self-pay | Admitting: Surgery

## 2017-06-04 ENCOUNTER — Telehealth: Payer: Self-pay | Admitting: Licensed Clinical Social Worker

## 2017-06-04 MED FILL — NOVOLOG MIX 70-30 FLEXPEN S: (70-30) 100 | 30 days supply | Qty: 15 | Fill #0

## 2017-06-04 MED FILL — UNIFINE PENTIPS 31GX3/16": 31G X 5 MM | 30 days supply | Qty: 100 | Fill #0

## 2017-06-04 MED FILL — UNIFINE PENTIPS 31GX3/16: 31G X 5 MM | 30 days supply | Qty: 100 | Fill #0

## 2017-06-04 NOTE — Telephone Encounter (Signed)
CSW received return call from Nancy Fetter, RN with [place] [phone] regarding patient status. RN states patient is at [facility] and will need transportation to HF clinic appointment on 06/10/17. CSW discussed referral to paramedicine program and coordinating visits to provide additional support to patient. RN in agreement with plan for paramedicine. CSW will make referral today and continue to follow for support and coordination of care. Raquel Sarna, [place], [place]

## 2017-06-04 NOTE — Telephone Encounter (Signed)
Appropriate paperwork sent via secure email to [facility] for patient referral to program.

## 2017-06-06 NOTE — Congregational Nurse Program (Signed)
Late Entry due to lap top being repaired at [facility]. 06/03/17  4:50 p.m. Visit to see client for first time at the [facility] Dr. Client lying on side in bed with life vest on and intact. Daughter Joseph Manning there with him. Nurse explained visit and that SW would be along soon as well. Nurse went over discharge summary with daughter and client who is very Joseph Manning so nurse had to speak loudly. Nurse noted medications that were present in the room: Brilinta 90 mg BID; Spironolactone25mg  1/2 tab QD;Amiodarone HCL 200mg  BID then 1 QD beginning 06/09/17. No other meds present. Nurse asked daughter to phone pharmacy to see if other meds are ready for pick up. Dtr. Called and pharmacy informed her that all other meds ready for pick-up with the exception of Insulin as it can not be filled again until Saturday as insurance would not pay for it.The pharmacy was also having to get authorization to fill spiriva but had already placed a call to PCP. Nurse instructed dtr. To pick up meds as soon as possible as client needed his inhalers as well as his protonix and lipitor.  Dtr. Said she planned to pick them up. Nurse discussed with client and his daughter the need to keep vest on and close to electrical outlet. Nurse checked and determined that vest was on and intact. Asked about Diabetes and daughter states that client only has enough insulin left for tonight, nurse noted novolog pen, daughter that two other pens had been left out of the refrigerator and pharmacy said they could not be used. Nurse discussed diabetic diet with both and stated she would find a resource to secure another insulin pen so that he would have insulin for tomorrow.Daughter aware of s/s of low and high glucose. VS B/P 108/70, AP-82 regular R-22, Lungs clear upper lobes, sl diminished lower lobes, color good, no edema to lower extremities. SW and daughter spoke while nurse gathered VS.  Nurse sent and e-mail to Joseph Manning to find out if there was a  resource for client to obtain insulin. Joseph Manning instructed nurse to call Heart Failure clinic and speak with SW Joseph Manning to see if they could provide this med.  Nurse will call in the morning.  Nurse and SW informed client and daughter that we would be visiting weekly and more often if needed, both our phone numbers written down and given to daughter to call if needed. Reminded client of appointment with [facility] Failure Clinic on next Wednesday at 9:40 a.m. Will discuss transportation with SW at Heart Failure clinic and get back with client. Joseph Manning D. Joseph Caraway, RN [place] [phone] Congregational Nurse Program Note  Date of Encounter: 06/06/2017  Past Medical History: Past Medical History:  Diagnosis Date  . Anxiety   . Arthritis   . COPD (chronic obstructive pulmonary disease) ([place])   . Depression   . Diabetes mellitus without complication ([place])   . Hypertension   . Tubular adenoma of colon 12/1999    Encounter Details:     CNP Questionnaire - 06/06/17 1339      Patient Demographics   Is this a new or existing patient? New   Patient is considered a/an Not Applicable   Race Caucasian/White     Patient Assistance   Location of Patient Assistance [facility] patient   Patient's financial/insurance status Medicare;Medicaid   Uninsured Patient ([facility]) No   Patient referred to apply for the following financial assistance Not Applicable   Food  insecurities addressed Provided food gift voucher   Transportation assistance Yes   Type of Assistance Taxi Voucher Given   Assistance securing medications Yes   Type of Assistance [facility]   Educational health offerings Acute disease;Chronic disease;Diabetes;Health literacy     Encounter Details   Primary purpose of visit Acute Illness/Condition Visit;Post ED/Hospitalization Visit   Was an Emergency Department visit averted? Not Applicable   Does patient have a medical provider? Yes   Patient referred to Follow up with  established PCP   Was a mental health screening completed? (GAINS tool) No   Does patient have dental issues? No   Does patient have vision issues? No   Does your patient have an abnormal blood pressure today? No   Since previous encounter, have you referred patient for abnormal blood pressure that resulted in a new diagnosis or medication change? No   Does your patient have an abnormal blood glucose today? --  No glucometer. Pt. states he lost it   Was there a life-saving intervention made? No

## 2017-06-07 ENCOUNTER — Telehealth: Payer: Self-pay

## 2017-06-07 NOTE — Telephone Encounter (Signed)
Nurse received a text from client's dtr. Stating "My dad just stopped breathing lifeboat did nothin"  Nurse recognized the number text coming from and immediately called client's dtr. Who was frantic.   [name] stated that she looked over at her dad and noted that he was gasping and that he then passed out.  She immediately called 911 and was instructed to start CPR which she did. She stated that it took EMS less than 5 minutes to arrive. Nurse attempted to calm [name] down, once she calmed down nurse asked how client had been prior to this client stated that " he was doing fine", was a lot more peppier than yesterday and had asked for ice cream that evening which she bought for him. Nurse asked if she had given him his medications and she stated that they had a late dinner so she was waiting to give them. Asked if anyone was there with her and she stated no, because she asked her husband to take the kids away from the room and that he had gone to take them to her mother-in law and she didn't know who else to call. Nurse assured her it was okay that she called and that I would stay on the phone with her as long as she needed me to. Remained on telephone with [name] approximately 25 minutes, [name] cried and stated that EMS had called it and that her dad died. Nurse attempted to comfort [name] and asked her if her husband was back she stated that they were downstairs, nurse told her to go and find them. [name] stated that she had another call coming in from her mother-in-law and had to hang up but would call nurse back.  She called back at 2300 and wanted the SW Karen's telephone number for the paramedics stated they needed to know where to take her dad's body.  Nurse gave her SW's number and asked if there was anything else she needed. Dtr. Stated that the hotel was placing her in another room for the night.  Nurse will contact client on Friday. Destiny Hagin D. [name] Therapist, sports. CNP [phone]

## 2017-06-07 NOTE — Congregational Nurse Program (Signed)
[id number] T.C to Joseph Manning at Heart Failure clinic to report on client status and to verify appt. For next week. Joseph Manning states appt. For Wednesday 06/10/17 at [place]. Nurse informed Joseph Manning client would need transportation. Joseph Manning to arrange taxi to pick client up 45 minutes prior to appt. Nurse to inform client and dtr. To be out in front of hotel by 0900. Nurse informed Joseph Manning that client out of several medication including insulin and asked in clinic could provide insulin. Joseph Manning stated no as it was not a heart medication otherwise they could. Nurse noted that client has heart meds on hand but none of his inhalers and only last night's dose of insulin.  Dtr. To pick up all other meds this morning. Infomred Joseph Manning that dtr. Stated that client had taken vest off on last evening as it was uncomfortable and making too much noise. I informed Joseph Manning that I spoke with client and dtr. Re: importance of client not removing vest and both voiced understanding. Joseph Manning will inform doctor. Nurse will seek other resource to get insulin before next dose is due. Joseph Manning recommended referral to paramedicine to start making visits as well on a different day from nurse.  Nurse agreed this would be helpful.  Nurse will be going out on Wednesday therefore  Paramedic can go out on any other day. Joseph Manning will make referral. Nurse will inform dtr.and client.  Joseph Bean D. Joseph Caraway RN, CNP  T.C to [name] to determine another resource for insulin. [name] will check with Dr. [name].  [facility] later called nurse to inform that insulin had been called into [facility] and would need to be picked up. Nurse picked up insulin from [place] at 1700 and phone dtr. To let her know I would be dropping it off.   Asked if she had picked up all other medications as planned and she stated that she had not but was on her way now to pick up. Sated she would not be at hotel when I got there to drop off insulin but there was a friend  there with him. Informed dtr. That client needed those other medication and that she should make sure to give client his inhalers as it had now been a day since he's had them. Dtr. Stated she would give them when she got back.  Joseph Manning, [place] nurse arrived at hotel. Client answered the door, states that his friend fell asleep in other bed reason he had to get up and answer door. Client appeared to be in good spirits was more talkative today than yesterday. Nurse handed him his insulin and reminded him that he needed to start his other meds up once his dtr. Returned. I also informed him that I had reported to the HF clinic about him removing his vest and the importance of not doing this as it would need to deliver a shock if needed. Client states that he would not remove for that long again, and only to shower. Also informed client of appt. On next Wednesday and to be ready for pick-up at 0900.  Joseph Manning, [place] Congregational Nurse Program Note  Date of Encounter: 06/03/2017  Past Medical History: Past Medical History:  Diagnosis Date  . Anxiety   . Arthritis   . COPD (chronic obstructive pulmonary disease) ([place])   . Depression   . Diabetes mellitus without complication ([place])   . Hypertension   . Tubular adenoma of colon 12/1999  Encounter Details:     CNP Questionnaire - 06/06/17 1339      Patient Demographics   Is this a new or existing patient? New   Patient is considered a/an Not Applicable   Race Caucasian/White     Patient Assistance   Location of Patient Assistance [facility] patient   Patient's financial/insurance status Medicare;Medicaid   Uninsured Patient ([facility] [employer]) No   Patient referred to apply for the following financial assistance Not Applicable   Food insecurities addressed Provided food gift voucher   Transportation assistance Yes   Type of Assistance Taxi Voucher Given   Assistance securing medications Yes   Type of  Assistance [facility]   Educational health offerings Acute disease;Chronic disease;Diabetes;Health literacy     Encounter Details   Primary purpose of visit Acute Illness/Condition Visit;Post ED/Hospitalization Visit   Was an Emergency Department visit averted? Not Applicable   Does patient have a medical provider? Yes   Patient referred to Follow up with established PCP   Was a mental health screening completed? (GAINS tool) No   Does patient have dental issues? No   Does patient have vision issues? No   Does your patient have an abnormal blood pressure today? No   Since previous encounter, have you referred patient for abnormal blood pressure that resulted in a new diagnosis or medication change? No   Does your patient have an abnormal blood glucose today? No  No glucometer. Pt. states he lost it   Since previous encounter, have you referred patient for abnormal blood glucose that resulted in a new diagnosis or medication change? No   Was there a life-saving intervention made? No

## 2017-06-10 ENCOUNTER — Encounter (HOSPITAL_COMMUNITY): Payer: Medicare Other | Admitting: Cardiology

## 2017-06-10 DIAGNOSIS — 419620001 Death: Secondary | SNOMED CT | POA: Diagnosis not present

## 2017-06-10 DEATH — deceased

## 2017-09-30 ENCOUNTER — Telehealth (HOSPITAL_COMMUNITY): Payer: Self-pay | Admitting: *Deleted

## 2017-09-30 NOTE — Telephone Encounter (Signed)
Trish left VM earlier today stating she needed help with this patient.  Returned call to Wannetta Sender, she states pt was d/c'd from hospital in July w/lifevest and went to a shelter.  She states they were notified in 13-Sep-2023 that pt had passed away and she has been trying since then to track down his [place].  She states she has called his daughter [name] on a few different numbers she was given but they are no longer in service. She read the number out to [place] and it is the numbers we have listed in patient's chart.  Advised per chart pt was in an [place] hotel room with daughter when he stopped breathing, vest was on and did not do anything, cpr was done but pt did not make it.  She ask for any help we can provide to help her track down the vest.  Advised pt was being followed by Congregational nurse Wayna Chalet and provided their phone number [phone].  She states she will f/u with them and was thankful for the info.
